# Patient Record
Sex: Male | Born: 1950 | Race: White | Hispanic: No | Marital: Married | Smoking: Former smoker
Health system: Southern US, Community
[De-identification: ages and names within clinical notes are randomized; demographics above are authoritative.]

## PROBLEM LIST (undated history)

## (undated) DIAGNOSIS — G8929 Other chronic pain: Secondary | ICD-10-CM

## (undated) DIAGNOSIS — R519 Headache, unspecified: Secondary | ICD-10-CM

## (undated) DIAGNOSIS — Z973 Presence of spectacles and contact lenses: Secondary | ICD-10-CM

## (undated) DIAGNOSIS — M51379 Other intervertebral disc degeneration, lumbosacral region without mention of lumbar back pain or lower extremity pain: Secondary | ICD-10-CM

## (undated) DIAGNOSIS — E039 Hypothyroidism, unspecified: Secondary | ICD-10-CM

## (undated) DIAGNOSIS — J449 Chronic obstructive pulmonary disease, unspecified: Secondary | ICD-10-CM

## (undated) DIAGNOSIS — N200 Calculus of kidney: Secondary | ICD-10-CM

## (undated) DIAGNOSIS — I7121 Aneurysm of the ascending aorta, without rupture: Secondary | ICD-10-CM

## (undated) DIAGNOSIS — I5189 Other ill-defined heart diseases: Secondary | ICD-10-CM

## (undated) DIAGNOSIS — Z87442 Personal history of urinary calculi: Secondary | ICD-10-CM

## (undated) DIAGNOSIS — I341 Nonrheumatic mitral (valve) prolapse: Secondary | ICD-10-CM

## (undated) DIAGNOSIS — I499 Cardiac arrhythmia, unspecified: Secondary | ICD-10-CM

## (undated) DIAGNOSIS — I77810 Thoracic aortic ectasia: Secondary | ICD-10-CM

## (undated) DIAGNOSIS — I480 Paroxysmal atrial fibrillation: Secondary | ICD-10-CM

## (undated) DIAGNOSIS — R51 Headache: Secondary | ICD-10-CM

## (undated) DIAGNOSIS — R0789 Other chest pain: Secondary | ICD-10-CM

## (undated) DIAGNOSIS — M5137 Other intervertebral disc degeneration, lumbosacral region: Secondary | ICD-10-CM

## (undated) HISTORY — DX: Other chest pain: R07.89

## (undated) HISTORY — DX: Aneurysm of the ascending aorta, without rupture: I71.21

## (undated) HISTORY — DX: Other ill-defined heart diseases: I51.89

## (undated) HISTORY — PX: HEMORROIDECTOMY: SUR656

## (undated) HISTORY — DX: Hypothyroidism, unspecified: E03.9

## (undated) HISTORY — DX: Calculus of kidney: N20.0

## (undated) HISTORY — PX: SPINE SURGERY: SHX786

## (undated) HISTORY — PX: CARDIAC CATHETERIZATION: SHX172

## (undated) HISTORY — PX: OTHER SURGICAL HISTORY: SHX169

## (undated) HISTORY — PX: LUMBAR FUSION: SHX111

## (undated) HISTORY — PX: VASECTOMY: SHX75

## (undated) HISTORY — DX: Thoracic aortic ectasia: I77.810

## (undated) HISTORY — DX: Personal history of urinary calculi: Z87.442

## (undated) HISTORY — DX: Paroxysmal atrial fibrillation: I48.0

## (undated) HISTORY — DX: Nonrheumatic mitral (valve) prolapse: I34.1

## (undated) HISTORY — PX: NASAL SINUS SURGERY: SHX719

## (undated) HISTORY — PX: EYE SURGERY: SHX253

---

## 2005-04-25 ENCOUNTER — Ambulatory Visit: Payer: Self-pay | Admitting: General Surgery

## 2005-04-25 HISTORY — PX: COLONOSCOPY: SHX174

## 2005-10-11 ENCOUNTER — Ambulatory Visit: Payer: Self-pay | Admitting: Family Medicine

## 2005-11-30 ENCOUNTER — Ambulatory Visit: Payer: Self-pay | Admitting: Unknown Physician Specialty

## 2009-09-16 ENCOUNTER — Ambulatory Visit: Payer: Self-pay | Admitting: Internal Medicine

## 2013-04-20 ENCOUNTER — Emergency Department: Payer: Self-pay | Admitting: Emergency Medicine

## 2013-04-20 LAB — CBC WITH DIFFERENTIAL/PLATELET
BASOS ABS: 0 10*3/uL (ref 0.0–0.1)
Basophil %: 0.7 %
EOS PCT: 1.5 %
Eosinophil #: 0.1 10*3/uL (ref 0.0–0.7)
HCT: 43 % (ref 40.0–52.0)
HGB: 14.9 g/dL (ref 13.0–18.0)
LYMPHS ABS: 1.4 10*3/uL (ref 1.0–3.6)
Lymphocyte %: 20.7 %
MCH: 30.7 pg (ref 26.0–34.0)
MCHC: 34.8 g/dL (ref 32.0–36.0)
MCV: 88 fL (ref 80–100)
Monocyte #: 0.5 x10 3/mm (ref 0.2–1.0)
Monocyte %: 7.8 %
Neutrophil #: 4.7 10*3/uL (ref 1.4–6.5)
Neutrophil %: 69.3 %
Platelet: 179 10*3/uL (ref 150–440)
RBC: 4.87 10*6/uL (ref 4.40–5.90)
RDW: 13.8 % (ref 11.5–14.5)
WBC: 6.8 10*3/uL (ref 3.8–10.6)

## 2013-04-20 LAB — URINALYSIS, COMPLETE
Bilirubin,UR: NEGATIVE
GLUCOSE, UR: NEGATIVE mg/dL (ref 0–75)
KETONE: NEGATIVE
Leukocyte Esterase: NEGATIVE
Nitrite: NEGATIVE
PH: 5 (ref 4.5–8.0)
PROTEIN: NEGATIVE
Specific Gravity: 1.019 (ref 1.003–1.030)
Squamous Epithelial: NONE SEEN

## 2013-04-20 LAB — BASIC METABOLIC PANEL
ANION GAP: 2 — AB (ref 7–16)
BUN: 22 mg/dL — AB (ref 7–18)
CO2: 31 mmol/L (ref 21–32)
CREATININE: 1.19 mg/dL (ref 0.60–1.30)
Calcium, Total: 8.4 mg/dL — ABNORMAL LOW (ref 8.5–10.1)
Chloride: 106 mmol/L (ref 98–107)
EGFR (African American): >60
EGFR (Non-African Amer.): >60
GLUCOSE: 114 mg/dL — AB (ref 65–99)
Osmolality: 282 (ref 275–301)
Potassium: 3.9 mmol/L (ref 3.5–5.1)
Sodium: 139 mmol/L (ref 136–145)

## 2013-06-28 ENCOUNTER — Ambulatory Visit (INDEPENDENT_AMBULATORY_CARE_PROVIDER_SITE_OTHER): Payer: BC Managed Care – PPO | Admitting: Cardiovascular Disease

## 2013-06-28 ENCOUNTER — Encounter: Payer: Self-pay | Admitting: Cardiovascular Disease

## 2013-06-28 ENCOUNTER — Encounter (INDEPENDENT_AMBULATORY_CARE_PROVIDER_SITE_OTHER): Payer: Self-pay

## 2013-06-28 VITALS — BP 112/86 | HR 51 | Ht 70.0 in | Wt 166.8 lb

## 2013-06-28 DIAGNOSIS — R0609 Other forms of dyspnea: Secondary | ICD-10-CM

## 2013-06-28 DIAGNOSIS — R0989 Other specified symptoms and signs involving the circulatory and respiratory systems: Secondary | ICD-10-CM

## 2013-06-28 DIAGNOSIS — I4819 Other persistent atrial fibrillation: Secondary | ICD-10-CM

## 2013-06-28 DIAGNOSIS — I4891 Unspecified atrial fibrillation: Secondary | ICD-10-CM

## 2013-06-28 DIAGNOSIS — R0600 Dyspnea, unspecified: Secondary | ICD-10-CM

## 2013-06-28 DIAGNOSIS — I341 Nonrheumatic mitral (valve) prolapse: Secondary | ICD-10-CM

## 2013-06-28 DIAGNOSIS — I059 Rheumatic mitral valve disease, unspecified: Secondary | ICD-10-CM

## 2013-06-28 MED ORDER — ASPIRIN 81 MG PO TABS: 81.0000 mg | ORAL_TABLET | Freq: Every day | ORAL | Status: DC

## 2013-06-28 NOTE — Progress Notes (Signed)
Primary care physician: Dr. Kary Kos  HPI  This is a 63 year old man who is here today to establish cardiovascular care. He is the husband of Elige Shouse whom I treated recently for non-ST elevation myocardial infarction. He is transferring care from [facility]. He reports prolonged history of atrial fibrillation which was diagnosed in the 31s. There was questionable severe mitral valve prolapse on initial echocardiogram that repeat evaluation at [facility] was unremarkable. He was told that the mitral valve prolapse was mild. There was also questionable intercardiac thrombus but that was excluded by a TEE. He was briefly on warfarin but had an allergic reaction with a rash. He has not been on any other blood thinners.   he reports being on atenolol and amiodarone for many years. The dose of atenolol was increased last year due to breakthrough palpitations. He developed hypothyroidism over the course of treatment with amiodarone. He complains of significant exertional dyspnea which has been happening for years with no chest discomfort. He has baseline bradycardia but denies dizziness or syncope unless he stands up suddenly. There is no history of stroke or congestive heart failure. He quit smoking in 2002 and does not consume alcohol. There is no family history of premature coronary artery disease. He had multiple stress test throughout the years but none recently. No previous cardiac catheterization.   Allergies  Allergen Reactions  . Coumadin [Warfarin]     rash     No current outpatient prescriptions on file prior to visit.   No current facility-administered medications on file prior to visit.     Past Medical History  Diagnosis Date  . Mitral valve prolapse   . History of kidney stones   . Hypothyroid   . Persistent atrial fibrillation      Past Surgical History  Procedure Laterality Date  . Nasal sinus surgery    . Hemorroidectomy       Family History  Problem Relation Age of  Onset  . Heart failure Mother      History   Social History  . Marital Status: Married    Spouse Name: N/A    Number of Children: N/A  . Years of Education: N/A   Occupational History  . Not on file.   Social History Main Topics  . Smoking status: Former Smoker -- 1.50 packs/day for 31 years    Types: Cigarettes  . Smokeless tobacco: Not on file  . Alcohol Use: No  . Drug Use: No  . Sexual Activity: Not on file   Other Topics Concern  . Not on file   Social History Narrative  . No narrative on file     ROS A 10 point review of system was performed. It is negative other than that mentioned in the history of present illness.   PHYSICAL EXAM   BP 112/86  Pulse 51  Ht 5\' 10"  (1.778 m)  Wt 166 lb 12 oz (75.637 kg)  BMI 23.93 kg/m2 Constitutional: He is oriented to person, place, and time. He appears well-developed and well-nourished. No distress.  HENT: No nasal discharge.  Head: Normocephalic and atraumatic.  Eyes: Pupils are equal and round.  No discharge. Neck: Normal range of motion. Neck supple. No JVD present. No thyromegaly present.  Cardiovascular: Bradycardic, regular rhythm, normal heart sounds. Exam reveals no gallop and no friction rub. There is a faint 1/6 holosystolic murmur at the apex  Pulmonary/Chest: Effort normal and breath sounds normal. No stridor. No respiratory distress. He has no wheezes. He has  no rales. He exhibits no tenderness.  Abdominal: Soft. Bowel sounds are normal. He exhibits no distension. There is no tenderness. There is no rebound and no guarding.  Musculoskeletal: Normal range of motion. He exhibits no edema and no tenderness.  Neurological: He is alert and oriented to person, place, and time. Coordination normal.  Skin: Skin is warm and dry. No rash noted. He is not diaphoretic. No erythema. No pallor.  Psychiatric: He has a normal mood and affect. His behavior is normal. Judgment and thought content normal.        AJO:INOMV  Bradycardia  -First degree A-V block  PRi = 224 -RSR(V1) -nondiagnostic.   - Extensive anterior-lateral infarct  Old  -Right axis -may be secondary to infarct.   -  Nonspecific T-abnormality.     ASSESSMENT AND PLAN

## 2013-06-28 NOTE — Assessment & Plan Note (Addendum)
He has prolonged history of atrial fibrillation. Currently he is maintained in sinus rhythm on small dose amiodarone. I advised him to start aspirin 81 mg once daily. CHADS2 VASc score is 0. Obviously, with his young age and prolonged exposure to amiodarone, there is increased risk of liver, thyroid and lung toxicity. It might be worth switching him to a different antiarrhythmic medication if he is a candidate or even considering catheter ablation. He is bradycardic with first degree AV block. However, he seems to be asymptomatic. EKG is significantly abnormal and he might require further ischemic workup after reviewing previous records I requested an echocardiogram to evaluate atrial size, LV systolic function and valvular structures. We also requested his previous cardiac records.

## 2013-06-28 NOTE — Assessment & Plan Note (Signed)
This will be evaluated with echocardiogram.

## 2013-06-28 NOTE — Patient Instructions (Signed)
Your physician has recommended you make the following change in your medication:  Start Aspirin 81 mg daily   Your physician has requested that you have an echocardiogram. Echocardiography is a painless test that uses sound waves to create images of your heart. It provides your doctor with information about the size and shape of your heart and how well your heart's chambers and valves are working. This procedure takes approximately one hour. There are no restrictions for this procedure.  Your physician recommends that you schedule a follow-up appointment in:  2 months

## 2013-07-09 ENCOUNTER — Other Ambulatory Visit (INDEPENDENT_AMBULATORY_CARE_PROVIDER_SITE_OTHER): Payer: BC Managed Care – PPO

## 2013-07-09 ENCOUNTER — Other Ambulatory Visit: Payer: Self-pay

## 2013-07-09 DIAGNOSIS — R0602 Shortness of breath: Secondary | ICD-10-CM

## 2013-07-09 DIAGNOSIS — R0600 Dyspnea, unspecified: Secondary | ICD-10-CM

## 2013-07-09 DIAGNOSIS — I059 Rheumatic mitral valve disease, unspecified: Secondary | ICD-10-CM

## 2013-07-09 DIAGNOSIS — I4891 Unspecified atrial fibrillation: Secondary | ICD-10-CM

## 2013-08-29 ENCOUNTER — Encounter: Payer: Self-pay | Admitting: Cardiovascular Disease

## 2013-08-29 ENCOUNTER — Ambulatory Visit (INDEPENDENT_AMBULATORY_CARE_PROVIDER_SITE_OTHER): Payer: BC Managed Care – PPO | Admitting: Cardiovascular Disease

## 2013-08-29 VITALS — BP 110/92 | HR 49 | Ht 70.0 in | Wt 167.2 lb

## 2013-08-29 DIAGNOSIS — I4819 Other persistent atrial fibrillation: Secondary | ICD-10-CM

## 2013-08-29 DIAGNOSIS — I341 Nonrheumatic mitral (valve) prolapse: Secondary | ICD-10-CM

## 2013-08-29 DIAGNOSIS — I059 Rheumatic mitral valve disease, unspecified: Secondary | ICD-10-CM

## 2013-08-29 DIAGNOSIS — I4891 Unspecified atrial fibrillation: Secondary | ICD-10-CM

## 2013-08-29 NOTE — Assessment & Plan Note (Signed)
Overall, the patient is doing reasonably well. He continues to have brief episodes of palpitations likely due to breakthrough atrial fibrillation. He is usually well aware of these episodes. He feels more fatigued and tired since the dose of atenolol was increased to 50 mg once daily last year likely due to bradycardia. It appears that he had more palpitations when he was on a smaller dose. We discussed different management options going forward. I have raised the concern about continuing amiodarone in his age group given the cumulative toxicity associated with this. He already developed hypothyroidism. I think the options going forward include continuing same medications , switching to a different antiarrhythmic medication or considering catheter ablation.  I am referring him to Dr. Rayann Heman to discuss options.

## 2013-08-29 NOTE — Assessment & Plan Note (Signed)
There was only mild mitral regurgitation associated with this.     

## 2013-08-29 NOTE — Progress Notes (Signed)
Primary care physician: Dr. Kary Kos  HPI  This is a 63 year old man who is here today for a followup visit regarding persistent atrial fibrillation and mitral valve prolapse with mild regurgitation. He switched recently from Dr. Josefa Half. He has prolonged history of atrial fibrillation which was diagnosed in the 32s. There was questionable severe mitral valve prolapse on initial echocardiogram but repeat evaluation at [facility] was unremarkable. He was told that the mitral valve prolapse was mild. There was also questionable intercardiac thrombus but that was excluded by a TEE. He was briefly on warfarin but had an allergic reaction with a rash. He has not been on any other blood thinners since then given his low CHADS2 VASc score.   he reports being on atenolol and amiodarone for many years. The dose of atenolol was increased last year due to breakthrough palpitations. He developed hypothyroidism over the course of treatment with amiodarone. He has baseline bradycardia but denies dizziness or syncope unless he stands up suddenly. There is no history of stroke or congestive heart failure. He quit smoking in 2002 and does not consume alcohol. There is no family history of premature coronary artery disease. He had multiple stress test throughout the years but none recently.  Echocardiogram in may of this year showed an ejection fraction of 55-60% with no evidence of left ventricular hypertrophy. The wall is moderate mitral valve prolapse with only mild regurgitation. There was mild biatrial enlargement. He describes brief episodes of palpitations usually for a few minutes.   Allergies  Allergen Reactions  . Coumadin [Warfarin]     rash     Current Outpatient Prescriptions on File Prior to Visit  Medication Sig Dispense Refill  . aspirin 81 MG tablet Take 1 tablet (81 mg total) by mouth daily.  30 tablet    . atenolol (TENORMIN) 50 MG tablet Take 50 mg by mouth daily.       Marland Kitchen levothyroxine (SYNTHROID,  LEVOTHROID) 75 MCG tablet Take 75 mcg by mouth daily before breakfast.       . PACERONE 200 MG tablet Take 100 mg by mouth daily.        No current facility-administered medications on file prior to visit.     Past Medical History  Diagnosis Date  . Mitral valve prolapse   . History of kidney stones   . Hypothyroid   . Persistent atrial fibrillation      Past Surgical History  Procedure Laterality Date  . Nasal sinus surgery    . Hemorroidectomy       Family History  Problem Relation Age of Onset  . Heart failure Mother      History   Social History  . Marital Status: Married    Spouse Name: N/A    Number of Children: N/A  . Years of Education: N/A   Occupational History  . Not on file.   Social History Main Topics  . Smoking status: Former Smoker -- 1.50 packs/day for 31 years    Types: Cigarettes  . Smokeless tobacco: Not on file  . Alcohol Use: No  . Drug Use: No  . Sexual Activity: Not on file   Other Topics Concern  . Not on file   Social History Narrative  . No narrative on file     ROS A 10 point review of system was performed. It is negative other than that mentioned in the history of present illness.   PHYSICAL EXAM   BP 110/92  Pulse 49  Ht  5\' 10"  (1.778 m)  Wt 167 lb 4 oz (75.864 kg)  BMI 24.00 kg/m2 Constitutional: He is oriented to person, place, and time. He appears well-developed and well-nourished. No distress.  HENT: No nasal discharge.  Head: Normocephalic and atraumatic.  Eyes: Pupils are equal and round.  No discharge. Neck: Normal range of motion. Neck supple. No JVD present. No thyromegaly present.  Cardiovascular: Bradycardic, regular rhythm, normal heart sounds. Exam reveals no gallop and no friction rub. There is a faint 1/6 holosystolic murmur at the apex  Pulmonary/Chest: Effort normal and breath sounds normal. No stridor. No respiratory distress. He has no wheezes. He has no rales. He exhibits no tenderness.    Abdominal: Soft. Bowel sounds are normal. He exhibits no distension. There is no tenderness. There is no rebound and no guarding.  Musculoskeletal: Normal range of motion. He exhibits no edema and no tenderness.  Neurological: He is alert and oriented to person, place, and time. Coordination normal.  Skin: Skin is warm and dry. No rash noted. He is not diaphoretic. No erythema. No pallor.  Psychiatric: He has a normal mood and affect. His behavior is normal. Judgment and thought content normal.       EKG: Marked sinus  Bradycardia  -Left atrial enlargement.   -Right sided conduction defect and right axis -possible right ventricular hypertrophy or posterior fascicular block.   -Anteroseptal infarct -age undetermined.   ABNORMAL     ASSESSMENT AND PLAN

## 2013-08-29 NOTE — Patient Instructions (Addendum)
Continue same medications.   Refer to Dr. Rayann Heman for A-fib:  July 24 @ 3:15. Please call [phone] if you cannot keep this appointment.  Your physician wants you to follow-up in: 6 months.  You will receive a reminder letter in the mail two months in advance. If you don't receive a letter, please call our office to schedule the follow-up appointment.

## 2013-09-13 ENCOUNTER — Institutional Professional Consult (permissible substitution): Payer: BC Managed Care – PPO | Admitting: Internal Medicine

## 2013-09-20 ENCOUNTER — Telehealth: Payer: Self-pay

## 2013-09-20 NOTE — Telephone Encounter (Signed)
Pt states he has been in afib all week, states he has to r/s his appt with Dr. Rayann Heman, and hasnt seen him yet. He thinks it may be his medication. Please call.

## 2013-09-20 NOTE — Telephone Encounter (Signed)
Patient states he is feeling better and is back in normal rhythm  I advised him to call if he has sustaining afib and is feeling symptomatic   Instructed him to go to the ED if he feels his situation is emergent

## 2013-10-21 ENCOUNTER — Encounter: Payer: Self-pay | Admitting: Internal Medicine

## 2013-10-21 ENCOUNTER — Ambulatory Visit (INDEPENDENT_AMBULATORY_CARE_PROVIDER_SITE_OTHER): Payer: BC Managed Care – PPO | Admitting: Internal Medicine

## 2013-10-21 VITALS — BP 118/76 | HR 52 | Ht 70.0 in | Wt 165.8 lb

## 2013-10-21 DIAGNOSIS — I4891 Unspecified atrial fibrillation: Secondary | ICD-10-CM

## 2013-10-21 DIAGNOSIS — I4819 Other persistent atrial fibrillation: Secondary | ICD-10-CM

## 2013-10-21 NOTE — Progress Notes (Signed)
Primary Care Physician: [name], MD Referring Physician: Dr. Veatrice Bourbon Bobby Flowers is a 63 y.o. male with a h/o atrial fibrillation on amiodarone therapy for 20 plus years, mitral valve prolapse, hypothyroidism, that is here today  for electrophysiology evaluation to discuss options for treatment of atrial fibrillation.  Patient currently has very little  atrial fibrillation burden on low dose of amiodarone 100 mg a day.   He uses atenolol as well to help maintain sinus rhythm.  He states for the most part he is in sinus rhythm. He has a chads vas score of 0 and is on a baby aspirin a day. He was worked up in [place] area 20 years ago for his atrial fibrillation currently followed by Dr. Fletcher Anon in [place].  Today, he denies symptoms of palpitations, chest pain, shortness of breath, orthopnea, PND, lower extremity edema, dizziness, presyncope, syncope, or neurologic sequela. The patient is tolerating medications without difficulties and is otherwise without complaint today.   Past Medical History  Diagnosis Date  . Mitral valve prolapse     With mild regurgitation  . History of kidney stones   . Hypothyroid   . Persistent atrial fibrillation   . Thyroid disease    Past Surgical History  Procedure Laterality Date  . Nasal sinus surgery    . Hemorroidectomy      Current Outpatient Prescriptions  Medication Sig Dispense Refill  . aspirin 81 MG tablet Take 1 tablet (81 mg total) by mouth daily.  30 tablet    . atenolol (TENORMIN) 50 MG tablet Take 75 mg by mouth daily.       Marland Kitchen levothyroxine (SYNTHROID, LEVOTHROID) 75 MCG tablet Take 75 mcg by mouth daily before breakfast.       . PACERONE 200 MG tablet Take 100 mg by mouth daily.       . SUMAtriptan (IMITREX) 100 MG tablet Take 1 tablet by mouth daily as needed. Migraines       No current facility-administered medications for this visit.    Allergies  Allergen Reactions  . Coumadin [Warfarin] Rash    History   Social  History  . Marital Status: Married    Spouse Name: N/A    Number of Children: N/A  . Years of Education: N/A   Occupational History  . Not on file.   Social History Main Topics  . Smoking status: Former Smoker -- 1.50 packs/day for 31 years    Types: Cigarettes  . Smokeless tobacco: Not on file  . Alcohol Use: No  . Drug Use: No  . Sexual Activity: Not on file   Other Topics Concern  . Not on file   Social History Narrative  . No narrative on file    Family History  Problem Relation Age of Onset  . Heart failure Mother     ROS- All systems are reviewed and negative except as per the HPI above  Physical Exam: Filed Vitals:   10/21/13 0852  BP: 118/76  Pulse: 52  Height: 5\' 10"  (1.778 m)  Weight: 75.206 kg (165 lb 12.8 oz)    GEN- The patient is well appearing, alert and oriented x 3 today.   Head- normocephalic, atraumatic Eyes-  Sclera clear, conjunctiva pink Ears- hearing intact Oropharynx- clear Neck- supple, no JVP Lymph- no cervical lymphadenopathy Lungs- Clear to ausculation bilaterally, normal work of breathing Heart- Regular rate and rhythm, no murmurs, rubs or gallops, PMI not laterally displaced GI- soft, NT, ND, + BS Extremities-  no clubbing, cyanosis, or edema MS- no significant deformity or atrophy Skin- no rash or lesion Psych- euthymic mood, full affect Neuro- strength and sensation are intact  EKG- Sinus rhythm at 52 bpm.  Echo LA size 68mm with mod MVP. Ef normal at 55-60%  Assessment and Plan: 1. Atrial fibrillation- Therapeutic strategies for afib including medicine and ablation were discussed in detail with the patient today. Risk, benefits, and alternatives to EP study and radiofrequency ablation for afib were also discussed in detail today. These risks include but are not limited to stroke, bleeding, vascular damage, tamponade, perforation, damage to the esophagus, lungs, and other structures, pulmonary vein stenosis, worsening renal  function, and death.  With low dose amiodarone/low risk for complication of drug and very little afib burden, patient wishes to continue with his current therapy did not make any changes at this time.   2. Chadsvasc of 0- continue asa.  3. F/u- as needed.  I have seen, examined the patient, and reviewed the above assessment and plan.  Changes to above are made where necessary.  The patient has persistent afib.  He has been treated with amiodarone for quite some time.  Risks, benefits, and alternatives to amiodarone were discussed.  We also discussed ablation today.  He is very clear at this time that he wishes to continue amiodarone and avoid change. I will therefore return his care to Dr Fletcher Anon.  He will return as needed.  Co Sign: Thompson Grayer, MD 10/21/2013 8:35 PM

## 2013-10-21 NOTE — Patient Instructions (Signed)
Your physician recommends that you schedule a follow-up appointment with Dr Rayann Heman as needed and Dr Fletcher Anon as scheduled

## 2014-03-18 ENCOUNTER — Encounter: Payer: Self-pay | Admitting: Cardiovascular Disease

## 2014-03-18 ENCOUNTER — Ambulatory Visit (INDEPENDENT_AMBULATORY_CARE_PROVIDER_SITE_OTHER): Payer: BLUE CROSS/BLUE SHIELD | Admitting: Cardiovascular Disease

## 2014-03-18 VITALS — BP 110/78 | HR 54 | Ht 70.0 in | Wt 169.0 lb

## 2014-03-18 DIAGNOSIS — I4819 Other persistent atrial fibrillation: Secondary | ICD-10-CM

## 2014-03-18 DIAGNOSIS — I341 Nonrheumatic mitral (valve) prolapse: Secondary | ICD-10-CM

## 2014-03-18 DIAGNOSIS — I481 Persistent atrial fibrillation: Secondary | ICD-10-CM

## 2014-03-18 MED ORDER — ATENOLOL 50 MG PO TABS: 75.0000 mg | ORAL_TABLET | Freq: Every day | ORAL | Status: DC

## 2014-03-18 NOTE — Assessment & Plan Note (Signed)
There was only mild mitral regurgitation associated with this.

## 2014-03-18 NOTE — Assessment & Plan Note (Signed)
He is maintaining in sinus rhythm with low dose amiodarone. He gets his labs checked with Dr. Kary Kos. I recommend checking TSH and liver profile every 6-12 months given that he is on amiodarone.

## 2014-03-18 NOTE — Progress Notes (Signed)
Primary care physician: Dr. Kary Kos  HPI  This is a 64 year old man who is here today for a followup visit regarding persistent atrial fibrillation and mitral valve prolapse with mild regurgitation. He has prolonged history of atrial fibrillation which was diagnosed in the 55s. There was questionable severe mitral valve prolapse on initial echocardiogram but repeat evaluation at [facility] was unremarkable. He was told that the mitral valve prolapse was mild. There was also questionable intercardiac thrombus but that was excluded by a TEE. He was briefly on warfarin but had an allergic reaction with a rash. He has not been on any other blood thinners since then given his low CHADS2 VASc score.  He quit smoking in 2002 and does not consume alcohol. There is no family history of premature coronary artery disease. He had multiple stress test throughout the years but none recently.  Echocardiogram in may of 2015 showed an ejection fraction of 55-60% with no evidence of left ventricular hypertrophy. There was moderate mitral valve prolapse with only mild regurgitation. There was mild biatrial enlargement. He was seen by Dr. Rayann Heman for consultation regarding possible catheter ablation for atrial fibrillation. Given stability on low-dose amiodarone, which was best decided to continue with medical therapy.   Allergies  Allergen Reactions  . Coumadin [Warfarin] Rash     Current Outpatient Prescriptions on File Prior to Visit  Medication Sig Dispense Refill  . aspirin 81 MG tablet Take 1 tablet (81 mg total) by mouth daily. 30 tablet   . levothyroxine (SYNTHROID, LEVOTHROID) 75 MCG tablet Take 75 mcg by mouth daily before breakfast.     . PACERONE 200 MG tablet Take 100 mg by mouth daily.     . SUMAtriptan (IMITREX) 100 MG tablet Take 1 tablet by mouth daily as needed. Migraines     No current facility-administered medications on file prior to visit.     Past Medical History  Diagnosis Date  . Mitral  valve prolapse     With mild regurgitation  . History of kidney stones   . Hypothyroid   . Persistent atrial fibrillation   . Thyroid disease      Past Surgical History  Procedure Laterality Date  . Nasal sinus surgery    . Hemorroidectomy       Family History  Problem Relation Age of Onset  . Heart failure Mother      History   Social History  . Marital Status: Married    Spouse Name: N/A    Number of Children: N/A  . Years of Education: N/A   Occupational History  . Not on file.   Social History Main Topics  . Smoking status: Former Smoker -- 1.50 packs/day for 31 years    Types: Cigarettes  . Smokeless tobacco: Not on file  . Alcohol Use: No  . Drug Use: No  . Sexual Activity: Not on file   Other Topics Concern  . Not on file   Social History Narrative     ROS A 10 point review of system was performed. It is negative other than that mentioned in the history of present illness.   PHYSICAL EXAM   BP 110/78 mmHg  Pulse 54  Ht 5\' 10"  (1.778 m)  Wt 169 lb (76.658 kg)  BMI 24.25 kg/m2 Constitutional: He is oriented to person, place, and time. He appears well-developed and well-nourished. No distress.  HENT: No nasal discharge.  Head: Normocephalic and atraumatic.  Eyes: Pupils are equal and round.  No discharge.  Neck: Normal range of motion. Neck supple. No JVD present. No thyromegaly present.  Cardiovascular: Bradycardic, regular rhythm, normal heart sounds. Exam reveals no gallop and no friction rub. There is a faint 1/6 holosystolic murmur at the apex  Pulmonary/Chest: Effort normal and breath sounds normal. No stridor. No respiratory distress. He has no wheezes. He has no rales. He exhibits no tenderness.  Abdominal: Soft. Bowel sounds are normal. He exhibits no distension. There is no tenderness. There is no rebound and no guarding.  Musculoskeletal: Normal range of motion. He exhibits no edema and no tenderness.  Neurological: He is alert and  oriented to person, place, and time. Coordination normal.  Skin: Skin is warm and dry. No rash noted. He is not diaphoretic. No erythema. No pallor.  Psychiatric: He has a normal mood and affect. His behavior is normal. Judgment and thought content normal.       EKG: Marked sinus  Bradycardia with first-degree AV block -Left atrial enlargement.   -Right sided conduction defect and right axis -possible right ventricular hypertrophy or posterior fascicular block.   -Anteroseptal infarct -age undetermined.   ABNORMAL     ASSESSMENT AND PLAN

## 2014-03-18 NOTE — Patient Instructions (Signed)
Continue same medications.   Your physician wants you to follow-up in: 6 months.  You will receive a reminder letter in the mail two months in advance. If you don't receive a letter, please call our office to schedule the follow-up appointment.  

## 2014-09-02 ENCOUNTER — Other Ambulatory Visit: Payer: Self-pay | Admitting: *Deleted

## 2014-09-02 ENCOUNTER — Telehealth: Payer: Self-pay | Admitting: *Deleted

## 2014-09-02 MED ORDER — AMIODARONE HCL 200 MG PO TABS: 100.0000 mg | ORAL_TABLET | Freq: Every day | ORAL | Status: DC

## 2014-09-02 NOTE — Telephone Encounter (Signed)
90 day supply sent to local pharmacy for Pacerone to [facility].

## 2014-09-02 NOTE — Telephone Encounter (Signed)
°  1. Which medications need to be refilled? Pacerone   2. Which pharmacy is medication to be sent to? [facility] on [address]  3. Do they need a 30 day or 90 day supply? 90   4. Would they like a call back once the medication has been sent to the pharmacy? No

## 2014-09-29 ENCOUNTER — Ambulatory Visit (INDEPENDENT_AMBULATORY_CARE_PROVIDER_SITE_OTHER): Payer: BLUE CROSS/BLUE SHIELD | Admitting: Cardiovascular Disease

## 2014-09-29 ENCOUNTER — Encounter: Payer: Self-pay | Admitting: Cardiovascular Disease

## 2014-09-29 VITALS — BP 106/74 | HR 86 | Ht 70.0 in | Wt 170.8 lb

## 2014-09-29 DIAGNOSIS — I4819 Other persistent atrial fibrillation: Secondary | ICD-10-CM

## 2014-09-29 DIAGNOSIS — I481 Persistent atrial fibrillation: Secondary | ICD-10-CM | POA: Diagnosis not present

## 2014-09-29 DIAGNOSIS — I4891 Unspecified atrial fibrillation: Secondary | ICD-10-CM | POA: Diagnosis not present

## 2014-09-29 DIAGNOSIS — I341 Nonrheumatic mitral (valve) prolapse: Secondary | ICD-10-CM

## 2014-09-29 MED ORDER — AMIODARONE HCL 200 MG PO TABS: 200.0000 mg | ORAL_TABLET | Freq: Every day | ORAL | Status: DC

## 2014-09-29 NOTE — Assessment & Plan Note (Signed)
There was only mild mitral regurgitation associated with this.

## 2014-09-29 NOTE — Progress Notes (Signed)
Primary care physician: Dr. Kary Kos  HPI  This is a 64 year old man who is here today for a followup visit regarding persistent atrial fibrillation and mitral valve prolapse with mild regurgitation. He has prolonged history of atrial fibrillation which was diagnosed in the 29s. There was questionable severe mitral valve prolapse on initial echocardiogram but repeat evaluation at [facility] was unremarkable. He was told that the mitral valve prolapse was mild. There was also questionable intercardiac thrombus but that was excluded by a TEE. He was briefly on warfarin but had an allergic reaction with a rash. He has not been on any other blood thinners since then given his low CHADS2 VASc score.  He quit smoking in 2002 and does not consume alcohol. There is no family history of premature coronary artery disease. He had multiple stress test throughout the years but none recently.  Echocardiogram in may of 2015 showed an ejection fraction of 55-60% with no evidence of left ventricular hypertrophy. There was moderate mitral valve prolapse with only mild regurgitation. There was mild biatrial enlargement. He was seen by Dr. Rayann Heman in 2015 for consultation regarding possible catheter ablation for atrial fibrillation. Given stability on low-dose amiodarone, which was best decided to continue with medical therapy. The patient noted recurrent episodes of atrial fibrillation over the last few weeks. He is usually aware of the episodes as he developed exertional palpitations and shortness of breath. No chest pain. There has been no significant change in his medications.   Allergies  Allergen Reactions  . Coumadin [Warfarin] Rash     Current Outpatient Prescriptions on File Prior to Visit  Medication Sig Dispense Refill  . aspirin 81 MG tablet Take 1 tablet (81 mg total) by mouth daily. 30 tablet   . atenolol (TENORMIN) 50 MG tablet Take 1.5 tablets (75 mg total) by mouth daily. 135 tablet 3  . levothyroxine  (SYNTHROID, LEVOTHROID) 75 MCG tablet Take 75 mcg by mouth daily before breakfast.     . SUMAtriptan (IMITREX) 100 MG tablet Take 1 tablet by mouth daily as needed. Migraines     No current facility-administered medications on file prior to visit.     Past Medical History  Diagnosis Date  . Mitral valve prolapse     With mild regurgitation  . History of kidney stones   . Hypothyroid   . Persistent atrial fibrillation   . Thyroid disease      Past Surgical History  Procedure Laterality Date  . Nasal sinus surgery    . Hemorroidectomy       Family History  Problem Relation Age of Onset  . Heart failure Mother      History   Social History  . Marital Status: Married    Spouse Name: N/A  . Number of Children: N/A  . Years of Education: N/A   Occupational History  . Not on file.   Social History Main Topics  . Smoking status: Former Smoker -- 1.50 packs/day for 31 years    Types: Cigarettes  . Smokeless tobacco: Not on file  . Alcohol Use: No  . Drug Use: No  . Sexual Activity: Not on file   Other Topics Concern  . Not on file   Social History Narrative     ROS A 10 point review of system was performed. It is negative other than that mentioned in the history of present illness.   PHYSICAL EXAM   BP 106/74 mmHg  Pulse 86  Ht 5\' 10"  (1.778 m)  Wt 170 lb 12 oz (77.452 kg)  BMI 24.50 kg/m2 Constitutional: He is oriented to person, place, and time. He appears well-developed and well-nourished. No distress.  HENT: No nasal discharge.  Head: Normocephalic and atraumatic.  Eyes: Pupils are equal and round.  No discharge. Neck: Normal range of motion. Neck supple. No JVD present. No thyromegaly present.  Cardiovascular: Normal rate, irregular rhythm, normal heart sounds. Exam reveals no gallop and no friction rub. There is a faint 1/6 holosystolic murmur at the apex  Pulmonary/Chest: Effort normal and breath sounds normal. No stridor. No respiratory  distress. He has no wheezes. He has no rales. He exhibits no tenderness.  Abdominal: Soft. Bowel sounds are normal. He exhibits no distension. There is no tenderness. There is no rebound and no guarding.  Musculoskeletal: Normal range of motion. He exhibits no edema and no tenderness.  Neurological: He is alert and oriented to person, place, and time. Coordination normal.  Skin: Skin is warm and dry. No rash noted. He is not diaphoretic. No erythema. No pallor.  Psychiatric: He has a normal mood and affect. His behavior is normal. Judgment and thought content normal.       EKG: Atrial fibrillation  - occasional ectopic ventricular beat    -Prominent R(V1) and right axis -consider right ventricular hypertrophy  -consider pulmonary disease.   -Anteroseptal infarct -age undetermined.   ABNORMAL    ASSESSMENT AND PLAN

## 2014-09-29 NOTE — Assessment & Plan Note (Addendum)
The patient is now in atrial fibrillation. Ventricular rate is reasonably controlled at rest but does not seem to be well controlled with activities. I discussed different management options and we decided to increase the dose of amiodarone back to 200 mg once daily. Continue atenolol at 75 mg once daily. If he continues to have symptomatic atrial fibrillation, then the next step would be to reconsider catheter ablation or a different anti-arrhythmia medication. I ordered routine labs today to evaluate for any reversible causes for A. fib.

## 2014-09-29 NOTE — Patient Instructions (Signed)
Medication Instructions:  Your physician has recommended you make the following change in your medication:  INCREASE amiodarone to 200mg  once per day   Labwork: Your physician recommends that you have labs today: TSH, CBC, BMET   Testing/Procedures: none  Follow-Up: Your physician recommends that you schedule a follow-up appointment in: three months with Dr. Fletcher Anon.    Any Other Special Instructions Will Be Listed Below (If Applicable).

## 2014-09-30 LAB — BASIC METABOLIC PANEL
BUN / CREAT RATIO: 11 (ref 10–22)
BUN: 11 mg/dL (ref 8–27)
CHLORIDE: 103 mmol/L (ref 97–108)
CO2: 21 mmol/L (ref 18–29)
Calcium: 8.8 mg/dL (ref 8.6–10.2)
Creatinine, Ser: 0.97 mg/dL (ref 0.76–1.27)
GFR calc non Af Amer: 83 mL/min/{1.73_m2} (ref >59)
GFR, EST AFRICAN AMERICAN: 96 mL/min/{1.73_m2} (ref >59)
Glucose: 90 mg/dL (ref 65–99)
POTASSIUM: 4.5 mmol/L (ref 3.5–5.2)
SODIUM: 140 mmol/L (ref 134–144)

## 2014-09-30 LAB — CBC
HEMATOCRIT: 45.3 % (ref 37.5–51.0)
Hemoglobin: 15.6 g/dL (ref 12.6–17.7)
MCH: 29.8 pg (ref 26.6–33.0)
MCHC: 34.4 g/dL (ref 31.5–35.7)
MCV: 87 fL (ref 79–97)
PLATELETS: 191 10*3/uL (ref 150–379)
RBC: 5.24 x10E6/uL (ref 4.14–5.80)
RDW: 13.8 % (ref 12.3–15.4)
WBC: 7.1 10*3/uL (ref 3.4–10.8)

## 2014-09-30 LAB — TSH: TSH: 2.75 u[IU]/mL (ref 0.450–4.500)

## 2014-10-15 ENCOUNTER — Telehealth: Payer: Self-pay | Admitting: *Deleted

## 2014-10-15 ENCOUNTER — Other Ambulatory Visit: Payer: Self-pay

## 2014-10-15 NOTE — Telephone Encounter (Signed)
Pt calling stating he saw [place] not to long ago and we changed his meds for he was in afib  Now is calling stating he feels like he is still there Please advise.

## 2014-10-15 NOTE — Telephone Encounter (Signed)
Left message on machine for patient to contact the office.   

## 2014-10-15 NOTE — Telephone Encounter (Signed)
S/w pt who states he still feels like he is in afib. Confirmed that he has been taking amiodarone 200mg  once per day since 8/8 OV States he can come in for EKG tomorrow. Nurse visit set up for 8/25, 10:30am Pt verbalized understanding with no further questions.

## 2014-10-16 ENCOUNTER — Ambulatory Visit (INDEPENDENT_AMBULATORY_CARE_PROVIDER_SITE_OTHER): Payer: BLUE CROSS/BLUE SHIELD

## 2014-10-16 ENCOUNTER — Telehealth: Payer: Self-pay

## 2014-10-16 VITALS — BP 118/78 | HR 107 | Ht 70.0 in | Wt 174.0 lb

## 2014-10-16 DIAGNOSIS — R0602 Shortness of breath: Secondary | ICD-10-CM | POA: Diagnosis not present

## 2014-10-16 DIAGNOSIS — I4891 Unspecified atrial fibrillation: Secondary | ICD-10-CM

## 2014-10-16 MED ORDER — RIVAROXABAN 20 MG PO TABS: 20.0000 mg | ORAL_TABLET | Freq: Every day | ORAL | Status: DC

## 2014-10-16 NOTE — Telephone Encounter (Signed)
S/w pt to reschedule labs for 2 weeks prior to cardioversion. Pt agreeable to Sept 7, 11am Pt had no further questions.

## 2014-10-16 NOTE — Patient Instructions (Addendum)
Medication Instructions:  Your physician has recommended you make the following change in your medication:  STOP taking aspirin START taking Xarelto 20mg  once per day  Labwork: Your physician recommends that you return for lab work in: two weeks. BMET, CBC, PT/INR   Testing/Procedures: You will need an EKG one day before your cardioversion: Sept  20  Follow-Up: Your physician recommends that you schedule a follow-up appointment after cardioversion   Any Other Special Instructions Will Be Listed Below (If Applicable).   You are scheduled for a Cardioversion on  Sept 21, 7:30am with Dr. Fletcher Anon Please arrive at the [place] [facility] at  6:30 a.m. on the day of your procedure.  DIET INSTRUCTIONS:  Nothing to eat or drink after midnight except your medications with a sip of water.         1) Medications:  YOU MAY TAKE ALL of your remaining medications with a small amount of water.  2) Must have a responsible person to drive you home.  3) Bring a current list of your medications and current insurance cards.    If you have any questions after you get home, please call the office at [phone]

## 2014-10-16 NOTE — Patient Instructions (Signed)
1.) Reason for visit: EKG/afib  2.) Name of MD requesting visit: Arida  3.) H&P: History of afib  4.)  ROS related to problem: Pt taking amiodarone since 8/8, atenolol for one week. States he feels like he is still in afib  5.) Assessment and plan per MD: EKG give to Dr. Fletcher Anon who reviewed. Confirmed afib.   Verbal from Arida: Stop aspirin Start xarelto 20mg  once per day Cardioversion in 3 weeks

## 2014-10-17 ENCOUNTER — Telehealth: Payer: Self-pay

## 2014-10-17 NOTE — Telephone Encounter (Signed)
S/w pt regarding changing cardioversion date. Pt agreeable to the following: Labs, Sept 9, 11am EKG, Sept 22, 11am Cardioversion Sept 23, 7:30am Pt verbalized understanding with no further questions.  Dates moved on calendar

## 2014-10-28 ENCOUNTER — Other Ambulatory Visit: Payer: BLUE CROSS/BLUE SHIELD

## 2014-10-29 ENCOUNTER — Other Ambulatory Visit: Payer: BLUE CROSS/BLUE SHIELD

## 2014-10-31 ENCOUNTER — Other Ambulatory Visit (INDEPENDENT_AMBULATORY_CARE_PROVIDER_SITE_OTHER): Payer: BLUE CROSS/BLUE SHIELD

## 2014-10-31 ENCOUNTER — Other Ambulatory Visit: Payer: BLUE CROSS/BLUE SHIELD

## 2014-10-31 DIAGNOSIS — I4891 Unspecified atrial fibrillation: Secondary | ICD-10-CM

## 2014-11-01 LAB — CBC
HEMATOCRIT: 43.8 % (ref 37.5–51.0)
HEMOGLOBIN: 14.6 g/dL (ref 12.6–17.7)
MCH: 29.1 pg (ref 26.6–33.0)
MCHC: 33.3 g/dL (ref 31.5–35.7)
MCV: 87 fL (ref 79–97)
Platelets: 183 10*3/uL (ref 150–379)
RBC: 5.01 x10E6/uL (ref 4.14–5.80)
RDW: 14 % (ref 12.3–15.4)
WBC: 8.2 10*3/uL (ref 3.4–10.8)

## 2014-11-12 SURGERY — Surgical Case
Anesthesia: *Unknown

## 2014-11-13 ENCOUNTER — Other Ambulatory Visit: Payer: Self-pay | Admitting: *Deleted

## 2014-11-13 ENCOUNTER — Ambulatory Visit: Payer: BLUE CROSS/BLUE SHIELD

## 2014-11-13 VITALS — BP 136/82 | HR 52 | Resp 18 | Ht 69.0 in | Wt 170.0 lb

## 2014-11-13 DIAGNOSIS — I4891 Unspecified atrial fibrillation: Secondary | ICD-10-CM

## 2014-11-13 MED ORDER — AMIODARONE HCL 200 MG PO TABS: 200.0000 mg | ORAL_TABLET | Freq: Every day | ORAL | Status: DC

## 2014-11-13 NOTE — Patient Instructions (Signed)
1.) Reason for visit: EKG prior to 9/23 DCCV  2.) Name of MD requesting visit: Arida  3.) H&P: Afib, take amiodarone  4.) ROS related to problem: EKG as pt scheduled for DCCV 9/23 at [facility]  5.) Assessment and plan per MD: EKG given to [facility] for review  Per Arida, pt not in afib at this time; cancel DCCV.  Dr. Fletcher Anon spoke with pt and orders given for  1 month f/u.  S/w Barbara in scheduling at 11:37am  to cancel procedure

## 2014-11-14 ENCOUNTER — Encounter: Payer: Self-pay | Admitting: Cardiovascular Disease

## 2014-11-14 ENCOUNTER — Encounter: Admission: RE | Payer: Self-pay | Source: Ambulatory Visit

## 2014-11-14 ENCOUNTER — Ambulatory Visit
Admission: RE | Admit: 2014-11-14 | Payer: BLUE CROSS/BLUE SHIELD | Source: Ambulatory Visit | Admitting: Cardiovascular Disease

## 2014-11-14 SURGERY — CARDIOVERSION (CATH LAB)
Anesthesia: General

## 2014-11-20 ENCOUNTER — Telehealth: Payer: Self-pay | Admitting: *Deleted

## 2014-11-20 ENCOUNTER — Other Ambulatory Visit: Payer: Self-pay | Admitting: *Deleted

## 2014-11-20 DIAGNOSIS — I4891 Unspecified atrial fibrillation: Secondary | ICD-10-CM

## 2014-11-20 MED ORDER — AMIODARONE HCL 200 MG PO TABS: 200.0000 mg | ORAL_TABLET | Freq: Every day | ORAL | Status: DC

## 2014-11-20 NOTE — Telephone Encounter (Signed)
Amiodarone #90 R#3 [facility] [address]

## 2014-11-20 NOTE — Telephone Encounter (Signed)
°  1. Which medications need to be refilled? Amioderine  2. Which pharmacy is medication to be sent to? [facility] on [address]  3. Do they need a 30 day or 90 day supply? 90 day insurance requires it   4. Would they like a call back once the medication has been sent to the pharmacy? No

## 2014-12-19 ENCOUNTER — Encounter: Payer: Self-pay | Admitting: Cardiovascular Disease

## 2014-12-19 ENCOUNTER — Ambulatory Visit (INDEPENDENT_AMBULATORY_CARE_PROVIDER_SITE_OTHER): Payer: BLUE CROSS/BLUE SHIELD | Admitting: Cardiovascular Disease

## 2014-12-19 VITALS — BP 108/70 | HR 49 | Ht 69.0 in | Wt 174.5 lb

## 2014-12-19 DIAGNOSIS — I481 Persistent atrial fibrillation: Secondary | ICD-10-CM

## 2014-12-19 DIAGNOSIS — I341 Nonrheumatic mitral (valve) prolapse: Secondary | ICD-10-CM

## 2014-12-19 DIAGNOSIS — I4819 Other persistent atrial fibrillation: Secondary | ICD-10-CM

## 2014-12-19 MED ORDER — ATENOLOL 100 MG PO TABS: 100.0000 mg | ORAL_TABLET | Freq: Every day | ORAL | Status: DC

## 2014-12-19 NOTE — Progress Notes (Signed)
Primary care physician: Dr. Kary Kos  HPI  This is a 64 year old man who is here today for a followup visit regarding persistent atrial fibrillation and mitral valve prolapse with mild regurgitation. He has prolonged history of atrial fibrillation which was diagnosed in the 93s. There was questionable severe mitral valve prolapse on initial echocardiogram but repeat evaluation at [facility] was unremarkable. He was told that the mitral valve prolapse was mild. There was also questionable intercardiac thrombus but that was excluded by a TEE. He was briefly on warfarin but had an allergic reaction with a rash. He has not been on any other blood thinners since then given his low CHADS2 VASc score.  He quit smoking in 2002 and does not consume alcohol. There is no family history of premature coronary artery disease. He had multiple stress test throughout the years but none recently.  Echocardiogram in may of 2015 showed an ejection fraction of 55-60% with no evidence of left ventricular hypertrophy. There was moderate mitral valve prolapse with only mild regurgitation. There was mild biatrial enlargement. He was seen by Dr. Rayann Heman in 2015 for consultation regarding possible catheter ablation for atrial fibrillation. Given stability on low-dose amiodarone, which was best decided to continue with medical therapy. He had recurrent atrial fibrillation recently. The dose of amiodarone was increased to 200 mg once daily. The dose of atenolol was also increased and currently he is taking 100 mg once daily. He converted to sinus rhythm chemically and feels back to baseline.   Allergies  Allergen Reactions  . Coumadin [Warfarin] Rash     Current Outpatient Prescriptions on File Prior to Visit  Medication Sig Dispense Refill  . amiodarone (PACERONE) 200 MG tablet Take 1 tablet (200 mg total) by mouth daily. 90 tablet 3  . atenolol (TENORMIN) 50 MG tablet Take 1.5 tablets (75 mg total) by mouth daily. (Patient  taking differently: Take 100 mg by mouth daily. ) 135 tablet 3  . levothyroxine (SYNTHROID, LEVOTHROID) 75 MCG tablet Take 75 mcg by mouth daily before breakfast.     . SUMAtriptan (IMITREX) 100 MG tablet Take 1 tablet by mouth daily as needed. Migraines    . rivaroxaban (XARELTO) 20 MG TABS tablet Take 1 tablet (20 mg total) by mouth daily with supper. (Patient not taking: Reported on 12/19/2014) 30 tablet 1   No current facility-administered medications on file prior to visit.     Past Medical History  Diagnosis Date  . Mitral valve prolapse     With mild regurgitation  . History of kidney stones   . Hypothyroid   . Persistent atrial fibrillation ([place])   . Thyroid disease      Past Surgical History  Procedure Laterality Date  . Nasal sinus surgery    . Hemorroidectomy       Family History  Problem Relation Age of Onset  . Heart failure Mother      Social History   Social History  . Marital Status: Married    Spouse Name: N/A  . Number of Children: N/A  . Years of Education: N/A   Occupational History  . Not on file.   Social History Main Topics  . Smoking status: Former Smoker -- 1.50 packs/day for 31 years    Types: Cigarettes  . Smokeless tobacco: Not on file  . Alcohol Use: No  . Drug Use: No  . Sexual Activity: Not on file   Other Topics Concern  . Not on file   Social History Narrative  ROS A 10 point review of system was performed. It is negative other than that mentioned in the history of present illness.   PHYSICAL EXAM   BP 108/70 mmHg  Pulse 49  Ht 5\' 9"  (1.753 m)  Wt 174 lb 8 oz (79.153 kg)  BMI 25.76 kg/m2 Constitutional: He is oriented to person, place, and time. He appears well-developed and well-nourished. No distress.  HENT: No nasal discharge.  Head: Normocephalic and atraumatic.  Eyes: Pupils are equal and round.  No discharge. Neck: Normal range of motion. Neck supple. No JVD present. No thyromegaly present.    Cardiovascular: Bradycardic, regular rhythm, normal heart sounds. Exam reveals no gallop and no friction rub. There is a faint 1/6 holosystolic murmur at the apex  Pulmonary/Chest: Effort normal and breath sounds normal. No stridor. No respiratory distress. He has no wheezes. He has no rales. He exhibits no tenderness.  Abdominal: Soft. Bowel sounds are normal. He exhibits no distension. There is no tenderness. There is no rebound and no guarding.  Musculoskeletal: Normal range of motion. He exhibits no edema and no tenderness.  Neurological: He is alert and oriented to person, place, and time. Coordination normal.  Skin: Skin is warm and dry. No rash noted. He is not diaphoretic. No erythema. No pallor.  Psychiatric: He has a normal mood and affect. His behavior is normal. Judgment and thought content normal.       EKG: Sinus bradycardia with first-degree AV block. Old anteroseptal infarct.  ASSESSMENT AND PLAN

## 2014-12-19 NOTE — Patient Instructions (Signed)
Medication Instructions: Continue with Atenolol 100 mg once daily (call us when you need refills) and Amiodarone 200 mg once daily  Labwork: None.   Procedures/Testing: None.  Follow-Up: 6 months with Dr. Fletcher Anon.   Any Additional Special Instructions Will Be Listed Below (If Applicable).

## 2014-12-19 NOTE — Assessment & Plan Note (Signed)
There was only mild mitral regurgitation associated with this.     

## 2014-12-19 NOTE — Assessment & Plan Note (Signed)
He is back in sinus rhythm after increasing the dose of amiodarone and atenolol. He is mildly bradycardic but seems to be asymptomatic at the present time. In the past, he had fatigue with low heart rate. If this happens again, then we can consider decreasing the dose of atenolol. Anticoagulation was initiated in anticipation of cardioversion. However, given that he is back in sinus rhythm, anticoagulation was discontinued. CHADS VASc score is 0.

## 2015-01-02 ENCOUNTER — Ambulatory Visit: Payer: BLUE CROSS/BLUE SHIELD | Admitting: Cardiovascular Disease

## 2015-02-17 ENCOUNTER — Ambulatory Visit (INDEPENDENT_AMBULATORY_CARE_PROVIDER_SITE_OTHER): Payer: BLUE CROSS/BLUE SHIELD | Admitting: Podiatry

## 2015-02-17 ENCOUNTER — Encounter: Payer: Self-pay | Admitting: Podiatry

## 2015-02-17 ENCOUNTER — Ambulatory Visit (INDEPENDENT_AMBULATORY_CARE_PROVIDER_SITE_OTHER): Payer: BLUE CROSS/BLUE SHIELD

## 2015-02-17 VITALS — BP 122/68 | HR 58 | Resp 18

## 2015-02-17 DIAGNOSIS — T148 Other injury of unspecified body region: Secondary | ICD-10-CM

## 2015-02-17 DIAGNOSIS — R52 Pain, unspecified: Secondary | ICD-10-CM

## 2015-02-17 DIAGNOSIS — T148XXA Other injury of unspecified body region, initial encounter: Secondary | ICD-10-CM

## 2015-02-17 NOTE — Patient Instructions (Signed)
Avulsion Fracture of the Foot An avulsion fracture of the foot is when a piece of bone in your foot has been torn away. Bones are connected to other bones by strong bands of connective tissue (ligaments). Muscles are also connected to bones with connective tissue (tendons). Avulsion fractures occur when severe stress on a bone from a ligament or tendon causes a small piece of bone to be pulled away.  Athletes may develop an avulsion fracture of the foot gradually (chronic avulsion fracture). The heel bone and the long bone in the foot that connect to the fifth toe (fifth metatarsal bone) are common areas of avulsion fracture of the foot.  CAUSES  An avulsion fracture of the foot can be caused by a sudden or repetitive twisting of your foot or ankle. It can also occur during a fall from a standing height.  RISK FACTORS You may have a higher risk of an avulsion fracture of the foot if you:   Participate in activities during which twisting the ankle or foot are likely, such as:  Dancing.  Track and field.  Walking or hiking on uneven surfaces.  Have had diabetes for many years.  Have osteoporosis. SIGNS AND SYMPTOMS The most common symptom of an avulsion fracture of the foot is intense pain at the time of injury. You may also feel a pop or tearing. The pain continues after the injury. Other signs and symptoms may include:  Swelling.  Bruising.  Pain with movement or weight bearing.  Difficulty walking.  Pain when pressure is applied to the injured area.  Warmth over the injured area. DIAGNOSIS  An avulsion fracture of the foot may be diagnosed by:  History. Your health care provider will ask you what occurred during the time of your injury and whether you had any pain in the area before your injury.  Physical exam. During the exam, your health care provider may try to move your foot, toes, and ankle to check for pain and level of mobility.  X-ray. This will show if any bones are  fractured or out of place.  MRI. This will show your tendons and ligaments. Some avulsion fractures are associated with an injury to a tendon or ligament. TREATMENT  Treatment for an avulsion fracture of the foot depends on the size of the displaced piece of bone and how far it has been pulled out of place. Small avulsion fractures may be treated with rest and support in a cast or brace. Large fragments of bone usually need to be reattached surgically. Treatment of these fractures may also require physical therapy to regain full use of your foot.  Treatments may include:  Rest, ice, compression, and elevations (RICE treatment) as directed by your health care provider.  Medicines that reduce pain and swelling (NSAIDs).  Wearing a splint, elastic wrap, support boot, or cast as directed by your health care provider.  Crutches or a rolling scooter to support your body weight until your foot heals.  Surgery to reattach the bone and tendon or ligament.  Physical therapy. This may last for several months. HOME CARE INSTRUCTIONS  Take medicines only as directed by your health care provider.  Rest your foot until your health care provider says you can resume activity.  Keep your foot raised above the level of your heart when you are sitting or lying down.  Apply ice to the injured area:  Put ice in a plastic bag.  Place a towel between your skin and the bag.  Leave the ice on for 20 minutes, 2-3 times a day.  Do not allow your cast or splint to get wet as directed by your health care provider.  Keep all follow-up visits as directed by your health care provider. This is important. SEEK MEDICAL CARE IF:  Your pain gets worse.  You have chills or fever.  Your cast or splint is damaged.  The cast has a bad odor or has stains caused by fluids from the wound. SEEK IMMEDIATE MEDICAL CARE IF:  Your foot is cold, blue, or pale.  You have pain, swelling, redness, or numbness below your  cast or splint.   This information is not intended to replace advice given to you by your health care provider. Make sure you discuss any questions you have with your health care provider.   Document Released: 09/04/2013 Document Reviewed: 09/04/2013 Elsevier Interactive Patient Education [employer].

## 2015-02-17 NOTE — Progress Notes (Signed)
   Subjective:    Patient ID: Bobby Flowers, male    DOB: 12-Feb-1951, 64 y.o.   MRN: [id number]  HPI  64 year old male presents the office with concerns of pain to the outside portion of his right foot for which she points to the fifth metatarsal base which started suddenly approximate 6 weeks ago. He denies any specific injury or trauma to the area. He does walk quite a bit at work at [employer] on concrete floors. He states that the pain is worse with walking or ambulation have there is no pain at rest. He feels that there is a bone sticking out of the side of his foot. He said no previous treatment. No other complaints at this time.   Review of Systems  All other systems reviewed and are negative.      Objective:   Physical Exam General: AAO x3, NAD  Dermatological: Skin is warm, dry and supple bilateral. Nails x 10 are well manicured; remaining integument appears unremarkable at this time. There are no open sores, no preulcerative lesions, no rash or signs of infection present.  Vascular: Dorsalis Pedis artery and Posterior Tibial artery pedal pulses are 2/4 bilateral with immedate capillary fill time. Pedal hair growth present. No varicosities and no lower extremity edema present bilateral. There is no pain with calf compression, swelling, warmth, erythema.   Neruologic: Grossly intact via light touch bilateral. Vibratory intact via tuning fork bilateral. Protective threshold with Semmes Wienstein monofilament intact to all pedal sites bilateral. Patellar and Achilles deep tendon reflexes 2+ bilateral. No Babinski or clonus noted bilateral.   Musculoskeletal: There is tenderness palpation along the fifth metatarsal base and the right foot. There is mild edema over on this area without any significant erythema or increase in warmth. No other areas of tenderness to bilateral lower extremities. There is no pain along the course of the peroneal tendons and they appear to be intact. ROM WNL.  Muscular strength 5/5 in all groups tested bilateral.  Gait: Unassisted, Nonantalgic.      Assessment & Plan:  64 year old male right fifth metatarsal likely stress/avulsion fracture -X-rays were obtained and reviewed with the patient. Most notably on the lateral view there does appear to be an avulsion fracture the fifth metatarsal base. -Treatment options discussed including all alternatives, risks, and complications -Etiology of symptoms were discussed -At this point recommend immobilization in a cam boot. This was dispensed today. -Ice to the area -Follow-up in 3 weeks or sooner if any problems arise. In the meantime, encouraged to call the office with any questions, concerns, change in symptoms.  -May transition to an ankle brace next appointment if needed. X-ray next appointment.   Celesta Gentile, DPM

## 2015-02-27 ENCOUNTER — Other Ambulatory Visit: Payer: Self-pay | Admitting: *Deleted

## 2015-02-27 MED ORDER — ATENOLOL 100 MG PO TABS: 100.0000 mg | ORAL_TABLET | Freq: Every day | ORAL | Status: DC

## 2015-03-10 ENCOUNTER — Ambulatory Visit (INDEPENDENT_AMBULATORY_CARE_PROVIDER_SITE_OTHER): Payer: BLUE CROSS/BLUE SHIELD | Admitting: Podiatry

## 2015-03-10 ENCOUNTER — Encounter: Payer: Self-pay | Admitting: Podiatry

## 2015-03-10 ENCOUNTER — Ambulatory Visit (INDEPENDENT_AMBULATORY_CARE_PROVIDER_SITE_OTHER): Payer: BLUE CROSS/BLUE SHIELD

## 2015-03-10 VITALS — BP 118/65 | HR 51 | Resp 18

## 2015-03-10 DIAGNOSIS — R52 Pain, unspecified: Secondary | ICD-10-CM

## 2015-03-10 DIAGNOSIS — T148 Other injury of unspecified body region: Secondary | ICD-10-CM | POA: Diagnosis not present

## 2015-03-10 DIAGNOSIS — M779 Enthesopathy, unspecified: Secondary | ICD-10-CM

## 2015-03-10 DIAGNOSIS — T148XXA Other injury of unspecified body region, initial encounter: Secondary | ICD-10-CM

## 2015-03-15 ENCOUNTER — Encounter: Payer: Self-pay | Admitting: Podiatry

## 2015-03-15 DIAGNOSIS — T148XXA Other injury of unspecified body region, initial encounter: Secondary | ICD-10-CM | POA: Insufficient documentation

## 2015-03-15 DIAGNOSIS — M779 Enthesopathy, unspecified: Secondary | ICD-10-CM | POA: Insufficient documentation

## 2015-03-15 NOTE — Progress Notes (Signed)
Patient ID: Bobby Flowers, male   DOB: 1950-05-29, 65 y.o.   MRN: [id number]  Subjective: 65 year old male presents the office today for follow-up evaluation of pain to the right foot. He states that since last appointment his pain is completely resolved and he is having no pain. Denies any swelling or redness. No recent injury or trauma he can recall. Denies any systemic complaints such as fevers, chills, nausea, vomiting. No acute changes since last appointment, and no other complaints at this time.   Objective: AAO x3, NAD DP/PT pulses palpable bilaterally, CRT less than 3 seconds Protective sensation intact with Simms Weinstein monofilament At this time there is no tenderness to palpation over the right fifth metatarsal base or to other areas of the foot. There is no overlying edema, erythema, increase in warmth. There is no pain with vibratory sensation. No areas of pinpoint bony tenderness or pain with vibratory sensation. MMT 5/5, ROM WNL. No edema, erythema, increase in warmth to bilateral lower extremities.  No open lesions or pre-ulcerative lesions.  No pain with calf compression, swelling, warmth, erythema  Assessment: Resolved right foot pain  Plan: -All treatment options discussed with the patient including all alternatives, risks, complications.  -X-rays were obtained and reviewed with the patient. There does continue to be fragmentation and likely fracture the right fifth metatarsal base although likely chronic. There is no pain to the area at this time. Discussed with him that if symptoms recur may need surgical intervention to remove the fracture fragment and repair the peroneal tendon however we will hold off on that for now. Continue supportive shoe unit discussed orthotics. -Follow-up as needed. -Patient encouraged to call the office with any questions, concerns, change in symptoms.   Celesta Gentile, DPM

## 2015-05-22 HISTORY — PX: HERNIA REPAIR: SHX51

## 2015-06-03 ENCOUNTER — Encounter: Payer: Self-pay | Admitting: *Deleted

## 2015-06-23 ENCOUNTER — Ambulatory Visit: Payer: BLUE CROSS/BLUE SHIELD | Admitting: General Surgery

## 2015-07-01 ENCOUNTER — Encounter: Payer: Self-pay | Admitting: General Surgery

## 2015-07-01 ENCOUNTER — Ambulatory Visit (INDEPENDENT_AMBULATORY_CARE_PROVIDER_SITE_OTHER): Payer: BLUE CROSS/BLUE SHIELD | Admitting: General Surgery

## 2015-07-01 VITALS — BP 108/72 | HR 58 | Resp 12 | Ht 70.0 in | Wt 170.0 lb

## 2015-07-01 DIAGNOSIS — Z1211 Encounter for screening for malignant neoplasm of colon: Secondary | ICD-10-CM | POA: Diagnosis not present

## 2015-07-01 MED ORDER — POLYETHYLENE GLYCOL 3350 17 GM/SCOOP PO POWD: ORAL | Status: DC

## 2015-07-01 NOTE — Progress Notes (Signed)
Patient ID: Bobby Flowers, male   DOB: 07-07-1950, 65 y.o.   MRN: [id number]  Chief Complaint  Patient presents with  . Colonoscopy    HPI Bobby Flowers is a 64 y.o. male here today for a evaluation of a screening colonoscopy. Last colonoscopy was done on 04/25/05. Patient states no GI problems at this time. HPI  Past Medical History  Diagnosis Date  . Mitral valve prolapse     With mild regurgitation  . History of kidney stones   . Hypothyroid   . Persistent atrial fibrillation ([place])   . Thyroid disease     Past Surgical History  Procedure Laterality Date  . Nasal sinus surgery    . Hemorroidectomy    . Colonoscopy  04/25/05    Family History  Problem Relation Age of Onset  . Heart failure Mother     Social History Social History  Substance Use Topics  . Smoking status: Former Smoker -- 1.50 packs/day for 31 years    Types: Cigarettes  . Smokeless tobacco: None  . Alcohol Use: No    Allergies  Allergen Reactions  . Coumadin [Warfarin] Rash    Current Outpatient Prescriptions  Medication Sig Dispense Refill  . amiodarone (PACERONE) 200 MG tablet Take 1 tablet (200 mg total) by mouth daily. 90 tablet 3  . atenolol (TENORMIN) 100 MG tablet Take 1 tablet (100 mg total) by mouth daily. 90 tablet 3  . levothyroxine (SYNTHROID, LEVOTHROID) 75 MCG tablet Take 75 mcg by mouth daily before breakfast.     . polyethylene glycol powder (GLYCOLAX/MIRALAX) powder 255 grams one bottle for colonoscopy prep 255 g 0  . SUMAtriptan (IMITREX) 100 MG tablet Take 1 tablet by mouth daily as needed. Reported on 07/01/2015     No current facility-administered medications for this visit.    Review of Systems Review of Systems  Constitutional: Negative.   Respiratory: Negative.   Cardiovascular: Negative.     Blood pressure 108/72, pulse 58, resp. rate 12, height 5\' 10"  (1.778 m), weight 170 lb (77.111 kg).  Physical Exam Physical Exam  Constitutional: He is oriented to person,  place, and time. He appears well-developed and well-nourished.  Eyes: Conjunctivae are normal. No scleral icterus.  Neck: Neck supple.  Cardiovascular: Normal rate, regular rhythm and normal heart sounds.   Pulmonary/Chest: Effort normal and breath sounds normal.  Lymphadenopathy:    He has no cervical adenopathy.  Neurological: He is alert and oriented to person, place, and time.  Skin: Skin is warm and dry.    Data Reviewed 2007 colonoscopy showed a 5 mm polyp in the rectum. Pathology showed this to be hyperplastic.  Assessment    Candidate for screening colonoscopy.    Plan         Colonoscopy with possible biopsy/polypectomy prn: Information regarding the procedure, including its potential risks and complications (including but not limited to perforation of the bowel, which may require emergency surgery to repair, and bleeding) was verbally given to the patient. Educational information regarding lower intestinal endoscopy was given to the patient. Written instructions for how to complete the bowel prep using Miralax were provided. The importance of drinking ample fluids to avoid dehydration as a result of the prep emphasized.  Patient has been scheduled for a colonoscopy on 07-22-15 at [facility].   PCP:  Kary Kos   This information has been scribed by Gaspar Cola CMA.  Robert Bellow 07/01/2015, 9:08 PM

## 2015-07-01 NOTE — Patient Instructions (Addendum)
Colonoscopy A colonoscopy is an exam to look at your colon. This exam can help find lumps (tumors), growths (polyps), bleeding, and redness and puffiness (inflammation) in your colon.  BEFORE THE PROCEDURE  Ask your doctor about changing or stopping your regular medicines.  You may need to drink a large amount of a special liquid (oral bowel prep). You start drinking this the day before your procedure. It will cause you to have watery poop (stool). This cleans out your colon.  Do not eat or drink anything else once you have started the bowel prep, unless your doctor tells you it is safe to do so.  Make plans for someone to drive you home after the procedure. PROCEDURE  You will be given medicine to help you relax (sedative).  You will lie on your side with your knees bent.  A tube with a camera on the end is put in the opening of your butt (anus) and into your colon. Pictures are sent to a computer screen. Your doctor will look for anything that is not normal.  Your doctor may take a tissue sample (biopsy) from your colon to be looked at more closely.  The exam is finished when your doctor has viewed all of the colon. AFTER THE PROCEDURE  Do not drive for 24 hours after the exam.  You may have a small amount of blood in your poop. This is normal.  You may pass gas and have belly (abdominal) cramps. This is normal.  Ask when your test results will be ready. Make sure you get your test results.   This information is not intended to replace advice given to you by your health care provider. Make sure you discuss any questions you have with your health care provider.   Document Released: 03/12/2010 Document Revised: 02/12/2013 Document Reviewed: 10/15/2012 Elsevier Interactive Patient Education [employer].  Patient has been scheduled for a colonoscopy on 07-22-15 at [facility].

## 2015-07-01 NOTE — H&P (Signed)
HPI  Bobby Flowers is a 65 y.o. male here today for a evaluation of a screening colonoscopy. Last colonoscopy was done on 04/25/05. Patient states no GI problems at this time.  HPI  Past Medical History   Diagnosis  Date   .  Mitral valve prolapse      With mild regurgitation   .  History of kidney stones    .  Hypothyroid    .  Persistent atrial fibrillation ([place])    .  Thyroid disease     Past Surgical History   Procedure  Laterality  Date   .  Nasal sinus surgery     .  Hemorroidectomy     .  Colonoscopy   04/25/05    Family History   Problem  Relation  Age of Onset   .  Heart failure  Mother     Social History  Social History   Substance Use Topics   .  Smoking status:  Former Smoker -- 1.50 packs/day for 31 years     Types:  Cigarettes   .  Smokeless tobacco:  None   .  Alcohol Use:  No    Allergies   Allergen  Reactions   .  Coumadin [Warfarin]  Rash    Current Outpatient Prescriptions   Medication  Sig  Dispense  Refill   .  amiodarone (PACERONE) 200 MG tablet  Take 1 tablet (200 mg total) by mouth daily.  90 tablet  3   .  atenolol (TENORMIN) 100 MG tablet  Take 1 tablet (100 mg total) by mouth daily.  90 tablet  3   .  levothyroxine (SYNTHROID, LEVOTHROID) 75 MCG tablet  Take 75 mcg by mouth daily before breakfast.     .  polyethylene glycol powder (GLYCOLAX/MIRALAX) powder  255 grams one bottle for colonoscopy prep  255 g  0   .  SUMAtriptan (IMITREX) 100 MG tablet  Take 1 tablet by mouth daily as needed. Reported on 07/01/2015      No current facility-administered medications for this visit.    Review of Systems  Review of Systems  Constitutional: Negative.  Respiratory: Negative.  Cardiovascular: Negative.   Blood pressure 108/72, pulse 58, resp. rate 12, height 5\' 10"  (1.778 m), weight 170 lb (77.111 kg).  Physical Exam  Physical Exam  Constitutional: He is oriented to person, place, and time. He appears well-developed and well-nourished.  Eyes:  Conjunctivae are normal. No scleral icterus.  Neck: Neck supple.  Cardiovascular: Normal rate, regular rhythm and normal heart sounds.  Pulmonary/Chest: Effort normal and breath sounds normal.  Lymphadenopathy:  He has no cervical adenopathy.  Neurological: He is alert and oriented to person, place, and time.  Skin: Skin is warm and dry.   Data Reviewed  2007 colonoscopy showed a 5 mm polyp in the rectum. Pathology showed this to be hyperplastic.  Assessment   Candidate for screening colonoscopy.   Plan    Colonoscopy with possible biopsy/polypectomy prn: Information regarding the procedure, including its potential risks and complications (including but not limited to perforation of the bowel, which may require emergency surgery to repair, and bleeding) was verbally given to the patient. Educational information regarding lower intestinal endoscopy was given to the patient. Written instructions for how to complete the bowel prep using Miralax were provided. The importance of drinking ample fluids to avoid dehydration as a result of the prep emphasized.  Patient has been scheduled for a colonoscopy on 07-22-15 at  [facility].  PCP: Kary Kos  This information has been scribed by Gaspar Cola CMA.  Robert Bellow  07/01/2015, 9:08 PM

## 2015-07-09 ENCOUNTER — Encounter: Payer: Self-pay | Admitting: General Surgery

## 2015-07-09 ENCOUNTER — Ambulatory Visit (INDEPENDENT_AMBULATORY_CARE_PROVIDER_SITE_OTHER): Payer: BLUE CROSS/BLUE SHIELD | Admitting: General Surgery

## 2015-07-09 VITALS — BP 124/66 | HR 72 | Resp 12 | Ht 70.0 in | Wt 171.0 lb

## 2015-07-09 DIAGNOSIS — K4090 Unilateral inguinal hernia, without obstruction or gangrene, not specified as recurrent: Secondary | ICD-10-CM | POA: Diagnosis not present

## 2015-07-09 NOTE — Progress Notes (Signed)
Patient ID: Bobby Flowers, male   DOB: 1950-02-27, 65 y.o.   MRN: [id number]  Chief Complaint  Patient presents with  . Other    hernia    HPI Bobby Flowers is a 65 y.o. male here today for a evaluation of a left inguinal hernia. Patient states he noticed this area on Tuesday 07/07/15. He describes the pain as dull. It feels better when he is sitting. He scheduled for a colonoscopy on 07/22/15. No problems urinating or moving bowels.  HPI  Past Medical History  Diagnosis Date  . Mitral valve prolapse     With mild regurgitation  . History of kidney stones   . Hypothyroid   . Persistent atrial fibrillation ([place])   . Thyroid disease     Past Surgical History  Procedure Laterality Date  . Nasal sinus surgery    . Hemorroidectomy    . Colonoscopy  04/25/05    Family History  Problem Relation Age of Onset  . Heart failure Mother     Social History Social History  Substance Use Topics  . Smoking status: Former Smoker -- 1.50 packs/day for 31 years    Types: Cigarettes  . Smokeless tobacco: None  . Alcohol Use: No    Allergies  Allergen Reactions  . Coumadin [Warfarin] Rash    Current Outpatient Prescriptions  Medication Sig Dispense Refill  . amiodarone (PACERONE) 200 MG tablet Take 1 tablet (200 mg total) by mouth daily. 90 tablet 3  . atenolol (TENORMIN) 100 MG tablet Take 1 tablet (100 mg total) by mouth daily. 90 tablet 3  . levothyroxine (SYNTHROID, LEVOTHROID) 75 MCG tablet Take 75 mcg by mouth daily before breakfast.     . SUMAtriptan (IMITREX) 100 MG tablet Take 1 tablet by mouth daily as needed. Reported on 07/01/2015     No current facility-administered medications for this visit.    Review of Systems Review of Systems  Constitutional: Negative.   Respiratory: Negative.   Cardiovascular: Negative.     Blood pressure 124/66, pulse 72, resp. rate 12, height 5\' 10"  (1.778 m), weight 171 lb (77.565 kg).  Physical Exam Physical Exam  Constitutional: He is  oriented to person, place, and time. He appears well-developed and well-nourished.  Cardiovascular: Normal rate and regular rhythm.   Murmur heard.  Systolic murmur is present with a grade of 2/6   Diastolic murmur is present with a grade of 6/6  Pulmonary/Chest: Effort normal and breath sounds normal.  Abdominal: Soft. Bowel sounds are normal. A hernia is present. Hernia confirmed positive in the left inguinal area.  Neurological: He is alert and oriented to person, place, and time.  Skin: Skin is warm and dry.    Data Reviewed 06/08/2015 CBC and comprehensive metabolic panel obtained in Dr. Verneda Skill office were reviewed. All normal.  Assessment    New-onset left inguinal hernia. Presently scheduled for colonoscopy on 07/22/2015.    Plan        Hernia precautions and incarceration were discussed with the patient. If they develop symptoms of an incarcerated hernia, they were encouraged to seek prompt medical attention.  I have recommended repair of the hernia using mesh on an outpatient basis in the near future. The risk of infection was reviewed. The role of prosthetic mesh to minimize the risk of recurrence was reviewed.  The patient will have his colonoscopy is scheduled and then follow-up same date with left inguinal hernia repair.   Plan to schedule to repair hernia same time  as colonoscopy. This is currently scheduled at [facility] for 07-22-15.   PCP:  Tilford Pillar, Forest Gleason 07/11/2015, 11:04 AM

## 2015-07-09 NOTE — Patient Instructions (Addendum)
Follow up as scheduled.  

## 2015-07-11 DIAGNOSIS — K4090 Unilateral inguinal hernia, without obstruction or gangrene, not specified as recurrent: Secondary | ICD-10-CM | POA: Insufficient documentation

## 2015-07-11 NOTE — H&P (Signed)
HPI  Bobby Flowers is a 65 y.o. male here today for a evaluation of a left inguinal hernia. Patient states he noticed this area on Tuesday 07/07/15. He describes the pain as dull. It feels better when he is sitting. He scheduled for a colonoscopy on 07/22/15. No problems urinating or moving bowels.  HPI  Past Medical History   Diagnosis  Date   .  Mitral valve prolapse      With mild regurgitation   .  History of kidney stones    .  Hypothyroid    .  Persistent atrial fibrillation ([place])    .  Thyroid disease     Past Surgical History   Procedure  Laterality  Date   .  Nasal sinus surgery     .  Hemorroidectomy     .  Colonoscopy   04/25/05    Family History   Problem  Relation  Age of Onset   .  Heart failure  Mother     Social History  Social History   Substance Use Topics   .  Smoking status:  Former Smoker -- 1.50 packs/day for 31 years     Types:  Cigarettes   .  Smokeless tobacco:  None   .  Alcohol Use:  No    Allergies   Allergen  Reactions   .  Coumadin [Warfarin]  Rash    Current Outpatient Prescriptions   Medication  Sig  Dispense  Refill   .  amiodarone (PACERONE) 200 MG tablet  Take 1 tablet (200 mg total) by mouth daily.  90 tablet  3   .  atenolol (TENORMIN) 100 MG tablet  Take 1 tablet (100 mg total) by mouth daily.  90 tablet  3   .  levothyroxine (SYNTHROID, LEVOTHROID) 75 MCG tablet  Take 75 mcg by mouth daily before breakfast.     .  SUMAtriptan (IMITREX) 100 MG tablet  Take 1 tablet by mouth daily as needed. Reported on 07/01/2015      No current facility-administered medications for this visit.    Review of Systems  Review of Systems  Constitutional: Negative.  Respiratory: Negative.  Cardiovascular: Negative.   Blood pressure 124/66, pulse 72, resp. rate 12, height 5\' 10"  (1.778 m), weight 171 lb (77.565 kg).  Physical Exam  Physical Exam  Constitutional: He is oriented to person, place, and time. He appears well-developed and well-nourished.   Cardiovascular: Normal rate and regular rhythm.  Murmur heard.  Systolic murmur is present with a grade of 2/6  Diastolic murmur is present with a grade of 6/6  Pulmonary/Chest: Effort normal and breath sounds normal.  Abdominal: Soft. Bowel sounds are normal. A hernia is present. Hernia confirmed positive in the left inguinal area.  Neurological: He is alert and oriented to person, place, and time.  Skin: Skin is warm and dry.   Data Reviewed  06/08/2015 CBC and comprehensive metabolic panel obtained in Dr. Verneda Skill office were reviewed. All normal.  Assessment   New-onset left inguinal hernia. Presently scheduled for colonoscopy on 07/22/2015.   Plan    Hernia precautions and incarceration were discussed with the patient. If they develop symptoms of an incarcerated hernia, they were encouraged to seek prompt medical attention.  I have recommended repair of the hernia using mesh on an outpatient basis in the near future. The risk of infection was reviewed. The role of prosthetic mesh to minimize the risk of recurrence was reviewed.  The patient will  have his colonoscopy is scheduled and then follow-up same date with left inguinal hernia repair.  Plan to schedule to repair hernia same time as colonoscopy. This is currently scheduled at [facility] for 07-22-15.  PCP: Tilford Pillar, Forest Gleason  07/11/2015, 11:04 AM

## 2015-07-15 ENCOUNTER — Encounter: Payer: Self-pay | Admitting: *Deleted

## 2015-07-15 ENCOUNTER — Other Ambulatory Visit: Payer: BLUE CROSS/BLUE SHIELD

## 2015-07-15 NOTE — Patient Instructions (Signed)
  Your procedure is scheduled on: 07-22-15  Report to [facility] To find out your arrival time please call [phone] between 1PM - 3PM on 07-21-15  Remember: Instructions that are not followed completely may result in serious medical risk, up to and including death, or upon the discretion of your surgeon and anesthesiologist your surgery may need to be rescheduled.    _X___ 1. Do not eat food or drink liquids after midnight. No gum chewing or hard candies.     _X___ 2. No Alcohol for 24 hours before or after surgery.   ____ 3. Bring all medications with you on the day of surgery if instructed.    _X___ 4. Notify your doctor if there is any change in your medical condition     (cold, fever, infections).     Do not wear jewelry, make-up, hairpins, clips or nail polish.  Do not wear lotions, powders, or perfumes. You may wear deodorant.  Do not shave 48 hours prior to surgery. Men may shave face and neck.  Do not bring valuables to the hospital.    [facility] is not responsible for any belongings or valuables.               Contacts, dentures or bridgework may not be worn into surgery.  Leave your suitcase in the car. After surgery it may be brought to your room.  For patients admitted to the hospital, discharge time is determined by your  treatment team.   Patients discharged the day of surgery will not be allowed to drive home.   Please read over the following fact sheets that you were given:     _X___ Take these medicines the morning of surgery with A SIP OF WATER:    1. AMIODARONE  2. ATENOLOL   3. LEVOTHYROXINE  4.  5.  6.  ____ Fleet Enema (as directed)   ____ Use CHG Soap as directed  ____ Use inhalers on the day of surgery  ____ Stop metformin 2 days prior to surgery    ____ Take 1/2 of usual insulin dose the night before surgery and none on the morning of surgery.   ____ Stop Coumadin/Plavix/aspirin-N/A  ____ Stop  Anti-inflammatories   ____ Stop supplements until after surgery.    ____ Bring C-Pap to the hospital.

## 2015-07-15 NOTE — Pre-Procedure Instructions (Signed)
Expand All Collapse All   There was only mild mitral regurgitation associated with this.

## 2015-07-15 NOTE — Pre-Procedure Instructions (Signed)
Persistent atrial fibrillation - Assessment & Plan Note   Bobby Flowers (MR# [id number])      Persistent atrial fibrillation - Assessment & Plan Note Info    Author Note Status Last Update User Last Update Date/Time   [name], MD Written [name], MD 12/19/2014 4:45 PM    Persistent atrial fibrillation - Assessment & Plan Note    Expand All Collapse All   He is back in sinus rhythm after increasing the dose of amiodarone and atenolol. He is mildly bradycardic but seems to be asymptomatic at the present time. In the past, he had fatigue with low heart rate. If this happens again, then we can consider decreasing the dose of atenolol. Anticoagulation was initiated in anticipation of cardioversion. However, given that he is back in sinus rhythm, anticoagulation was discontinued. CHADS VASc score is 0.

## 2015-07-21 ENCOUNTER — Encounter: Payer: Self-pay | Admitting: *Deleted

## 2015-07-21 NOTE — Pre-Procedure Instructions (Signed)
Bobby Hampshire, MD at 12/19/2014 3:44 PM     Status: Signed       Expand All Collapse All     Primary care physician: Dr. Kary Kos  HPI  This is a 65 year old man who is here today for a followup visit regarding persistent atrial fibrillation and mitral valve prolapse with mild regurgitation. He has prolonged history of atrial fibrillation which was diagnosed in the 28s. There was questionable severe mitral valve prolapse on initial echocardiogram but repeat evaluation at [facility] was unremarkable. He was told that the mitral valve prolapse was mild. There was also questionable intercardiac thrombus but that was excluded by a TEE. He was briefly on warfarin but had an allergic reaction with a rash. He has not been on any other blood thinners since then given his low CHADS2 VASc score.  He quit smoking in 2002 and does not consume alcohol. There is no family history of premature coronary artery disease. He had multiple stress test throughout the years but none recently.  Echocardiogram in may of 2015 showed an ejection fraction of 55-60% with no evidence of left ventricular hypertrophy. There was moderate mitral valve prolapse with only mild regurgitation. There was mild biatrial enlargement. He was seen by Dr. Rayann Heman in 2015 for consultation regarding possible catheter ablation for atrial fibrillation. Given stability on low-dose amiodarone, which was best decided to continue with medical therapy. He had recurrent atrial fibrillation recently. The dose of amiodarone was increased to 200 mg once daily. The dose of atenolol was also increased and currently he is taking 100 mg once daily. He converted to sinus rhythm chemically and feels back to baseline.   Allergies  Allergen Reactions  . Coumadin [Warfarin] Rash     Current Outpatient Prescriptions on File Prior to Visit  Medication Sig Dispense Refill  . amiodarone (PACERONE) 200 MG tablet Take 1 tablet (200 mg total)  by mouth daily. 90 tablet 3  . atenolol (TENORMIN) 50 MG tablet Take 1.5 tablets (75 mg total) by mouth daily. (Patient taking differently: Take 100 mg by mouth daily. ) 135 tablet 3  . levothyroxine (SYNTHROID, LEVOTHROID) 75 MCG tablet Take 75 mcg by mouth daily before breakfast.     . SUMAtriptan (IMITREX) 100 MG tablet Take 1 tablet by mouth daily as needed. Migraines    . rivaroxaban (XARELTO) 20 MG TABS tablet Take 1 tablet (20 mg total) by mouth daily with supper. (Patient not taking: Reported on 12/19/2014) 30 tablet 1   No current facility-administered medications on file prior to visit.     Past Medical History  Diagnosis Date  . Mitral valve prolapse     With mild regurgitation  . History of kidney stones   . Hypothyroid   . Persistent atrial fibrillation ([place])   . Thyroid disease      Past Surgical History  Procedure Laterality Date  . Nasal sinus surgery    . Hemorroidectomy       Family History  Problem Relation Age of Onset  . Heart failure Mother      Social History   Social History  . Marital Status: Married    Spouse Name: N/A  . Number of Children: N/A  . Years of Education: N/A   Occupational History  . Not on file.   Social History Main Topics  . Smoking status: Former Smoker -- 1.50 packs/day for 31 years    Types: Cigarettes  . Smokeless tobacco: Not on file  . Alcohol Use: No  .  Drug Use: No  . Sexual Activity: Not on file   Other Topics Concern  . Not on file   Social History Narrative     ROS A 10 point review of system was performed. It is negative other than that mentioned in the history of present illness.   PHYSICAL EXAM   BP 108/70 mmHg  Pulse 49  Ht 5\' 9"  (1.753 m)  Wt 174 lb 8 oz (79.153 kg)  BMI 25.76 kg/m2 Constitutional: He is oriented to person, place, and time. He appears  well-developed and well-nourished. No distress.  HENT: No nasal discharge.  Head: Normocephalic and atraumatic.  Eyes: Pupils are equal and round. No discharge. Neck: Normal range of motion. Neck supple. No JVD present. No thyromegaly present.  Cardiovascular: Bradycardic, regular rhythm, normal heart sounds. Exam reveals no gallop and no friction rub. There is a faint 1/6 holosystolic murmur at the apex  Pulmonary/Chest: Effort normal and breath sounds normal. No stridor. No respiratory distress. He has no wheezes. He has no rales. He exhibits no tenderness.  Abdominal: Soft. Bowel sounds are normal. He exhibits no distension. There is no tenderness. There is no rebound and no guarding.  Musculoskeletal: Normal range of motion. He exhibits no edema and no tenderness.  Neurological: He is alert and oriented to person, place, and time. Coordination normal.  Skin: Skin is warm and dry. No rash noted. He is not diaphoretic. No erythema. No pallor.  Psychiatric: He has a normal mood and affect. His behavior is normal. Judgment and thought content normal.       EKG: Sinus bradycardia with first-degree AV block. Old anteroseptal infarct.  ASSESSMENT AND PLAN              Persistent atrial fibrillation - Bobby Hampshire, MD at 12/19/2014 4:45 PM     Status: Written Related Problem: Persistent atrial fibrillation   Expand All Collapse All   He is back in sinus rhythm after increasing the dose of amiodarone and atenolol. He is mildly bradycardic but seems to be asymptomatic at the present time. In the past, he had fatigue with low heart rate. If this happens again, then we can consider decreasing the dose of atenolol. Anticoagulation was initiated in anticipation of cardioversion. However, given that he is back in sinus rhythm, anticoagulation was discontinued. CHADS VASc score is 0.            Mitral valve prolapse - Bobby Hampshire, MD at 12/19/2014 4:45 PM       Status: Written Related Problem: Mitral valve prolapse   Expand All Collapse All   There was only mild mitral regurgitation associated with this.                   Referring Provider     [name], MD     Diagnoses     Persistent atrial fibrillation ([place]) - Primary    ICD-9-CM: 427.31 ICD-10-CM: I48.1    Mitral valve prolapse     ICD-9-CM: 424.0 ICD-10-CM: I34.1       Reason for Visit     Other    Hx afib. Pt mentioned he broke out of afib after increasing his atenolol and would also like all future medications mailorder/90 day. Meds reviewed verbally.     Reason for Visit History        Ordered Medications       Disp Refills Start End    atenolol (TENORMIN) 100 MG tablet (Discontinued) 90 tablet  3 12/19/2014 02/27/2015    Take 1 tablet (100 mg total) by mouth daily. - Oral    Reason for Discontinue: Reorder      Discontinued Medications       Reason for Discontinue    rivaroxaban (XARELTO) 20 MG TABS tablet     atenolol (TENORMIN) 50 MG tablet       Medication Notes     rivaroxaban (XARELTO) 20 MG TABS tablet     Britt Bottom, [facility] 12/19/2014 3:27 PM Pt has not taken in a week due to samples running out.        Level of Service     PR OFFICE OUTPATIENT VISIT 15 MINUTES [99213]      Follow-up and Disposition     Return in about 6 months (around 06/19/2015).       All Charges for This Encounter     Code Description Service Date Service Provider Modifiers Qty    93000 PR ELECTROCARDIOGRAM, COMPLETE 12/19/2014 Bobby Hampshire, MD  1    [phone] PR OFFICE OUTPATIENT VISIT 15 MINUTES 12/19/2014 Bobby Hampshire, MD  1      AVS Reports     Date/Time Report Action User    12/19/2014 3:58 PM After Visit Summary Printed Bobby Hampshire, MD      Patient Instructions     Medication Instructions: Continue with Atenolol 100 mg once daily (call us when you need refills)  and Amiodarone 200 mg once daily  Labwork: None.   Procedures/Testing: None.  Follow-Up: 6 months with Dr. Fletcher Anon.   Any Additional Special Instructions Will Be Listed Below (If Applicable).         Routing History     There are no sent or routed communications associated with this encounter.     Previous Visit       Provider Department Encounter #    11/20/2014 2:25 PM [name], MD [facility] [id number]

## 2015-07-21 NOTE — Pre-Procedure Instructions (Signed)
Echocardiogram without contrast  Order# [id number]   Ordering physician: [name], MD  Study date: 07/09/13      Result Notes    Notes Recorded by Tracie Harrier, RN on 07/12/2013 at 10:57 AM Reviewed results with patient.   ------  Notes Recorded by Tracie Harrier, RN on 07/12/2013 at 9:36 AM LVM 5/22 ------  Notes Recorded by [name], MD on 07/10/2013 at 3:57 PM Inform patient that echo was fine. EF is normal. He does have mitral valve prolapse but leaking in the valve is only mild. ------  Notes Recorded by Tracie Harrier, RN on 07/09/2013 at 4:15 PM RN reviewed. Forwarded to MD.    [name] Result    Result status: Final result                  *[facility]*         [address] [place]            [place], [place] [address]              [phone]  ------------------------------------------------------------------- Transthoracic Echocardiography  Patient:  Bobby, Flowers MR #:    [id number] Study Date: 07/09/2013 Gender:   M Age:    65 Height:   177.8 cm Weight:   75.3 kg BSA:    1.93 m^2 Pt. Status: Room:  ATTENDING  Oda Cogan, Muhammad REFERRING  [name] SONOGRAPHER Timmothy Sours, RDCS, RVT PERFORMING  [facility], [place]  cc:  ------------------------------------------------------------------- LV EF: 55% -  60%  ------------------------------------------------------------------- History:  PMH: Acquired from the patient and from the patient&'s chart. Dyspnea. Atrial fibrillation. Mitral valve disease. Risk factors: Former tobacco use.  ------------------------------------------------------------------- Study Conclusions  - Left ventricle: The cavity size was normal. Wall thickness was normal. Systolic function was normal. The estimated ejection fraction was in the range of 55% to 60%. Wall motion was  normal; there were no regional wall motion abnormalities. Doppler parameters are consistent with abnormal left ventricular relaxation (grade 1 diastolic dysfunction). - Mitral valve: Moderate prolapse, involving the anterior leaflet and the posterior leaflet. There was mild regurgitation. - Left atrium: The atrium was mildly dilated. - Right atrium: The atrium was mildly dilated. - Pulmonary arteries: Systolic pressure was within the normal range.  -------------------------------------------------------------------  ------------------------------------------------------------------- Left ventricle: The cavity size was normal. Wall thickness was normal. Systolic function was normal. The estimated ejection fraction was in the range of 55% to 60%. Wall motion was normal; there were no regional wall motion abnormalities. Doppler parameters are consistent with abnormal left ventricular relaxation (grade 1 diastolic dysfunction).  ------------------------------------------------------------------- Aortic valve:  Trileaflet; normal thickness leaflets. Mobility was not restricted. Doppler: Transvalvular velocity was within the normal range. There was no stenosis. There was no regurgitation. Mean gradient (S): 4 mm Hg.  ------------------------------------------------------------------- Aorta: Aortic root: The aortic root was normal in size.  ------------------------------------------------------------------- Mitral valve:  Mildly thickened leaflets . Mobility was not restricted. Moderate prolapse, involving the anterior leaflet and the posterior leaflet. Doppler: Transvalvular velocity was within the normal range. There was no evidence for stenosis. There was mild regurgitation.  Peak gradient (D): 3 mm Hg.  ------------------------------------------------------------------- Left atrium: The atrium was mildly  dilated.  ------------------------------------------------------------------- Right ventricle: The cavity size was normal. Wall thickness was normal. Systolic function was normal.  ------------------------------------------------------------------- Pulmonic valve:  Doppler: Transvalvular velocity was within the normal range. There was no evidence for stenosis.  ------------------------------------------------------------------- Tricuspid valve:  Structurally normal valve.  Doppler: Transvalvular velocity was within the normal range. There was trivial regurgitation.  ------------------------------------------------------------------- Pulmonary artery:  The main pulmonary artery was normal-sized. Systolic pressure was within the normal range.  ------------------------------------------------------------------- Right atrium: The atrium was mildly dilated.  ------------------------------------------------------------------- Pericardium: There was no pericardial effusion.  ------------------------------------------------------------------- Systemic veins: Inferior vena cava: The vessel was normal in size.  ------------------------------------------------------------------- Prepared and Electronically Authenticated by  [name] 2015-05-19T13:03:03  ------------------------------------------------------------------- Measurements  Left ventricle             Value    Reference LV ID, ED, PLAX chordal     (L)   4.5  cm   43.0 - 52.0 LV ID, ES, PLAX chordal     (L)   3.2  cm   23.0 - 38.0 LV fx shortening, PLAX chordal (N)   29  %   >=29 LV PW thickness, ED           1.2  cm   ----------- IVS/LV PW ratio, ED       (N)   0.72     <=1.3  Ventricular septum           Value    Reference IVS thickness, ED            0.9  cm   -----------  Aortic valve               Value    Reference Aortic valve mean velocity, S      0.92 m/sec ----------- Aortic valve VTI, S           26.5 cm   ----------- Aortic mean gradient, S         4   mm Hg -----------  Aorta                  Value    Reference Aortic root ID, ED           3.1  cm   -----------  Left atrium               Value    Reference LA ID, A-P, ES             3.1  cm   ----------- LA ID/bsa, A-P         (N)   1.6  cm/m^2 <=2.2 LA volume, S              61  ml   ----------- LA volume/bsa, S            32  ml/m^2 -----------  Mitral valve              Value    Reference Mitral E-wave peak velocity       0.85 m/sec ----------- Mitral A-wave peak velocity       0.92 m/sec ----------- Mitral deceleration time    (H)   264  ms   150 - 230 Mitral peak gradient, D         3   mm Hg ----------- Mitral E/A ratio, peak         0.9     -----------  Pulmonic valve             Value    Reference Pulmonic valve peak velocity, S     1   m/sec -----------  Legend: (L) and (H) mark values outside specified reference range.  (N) marks values inside specified reference range.    Patient  Information    Patient Name Sex DOB SSN   Bobby, Flowers Male [birth year] [id number]    Reason For Exam  Priority: Routine   Dx: A-fib ([place]) [I48.91 (ICD-10-CM)]; Dyspnea [R06.00 (ICD-10-CM)]    Performing Technologist/Nurse    Performing Technologist/Nurse:       Order-Level Documents:    There are no order-level documents.    Encounter-Level Documents - 07/09/2013:      Electronic signature on 07/09/2013 11:00 AM    Printable Result Report    Result Report    2D Echocardiogram without contrast (Order [id number])   Echocardiography  Order: [id number]   Released By: Sharene Butters  Authorizing: [name], MD   Date: 07/09/2013  Department: [facility]       Result Notes     Notes Recorded by Tracie Harrier, RN on 07/12/2013 at 10:57 AM Reviewed results with patient.   ------  Notes Recorded by Tracie Harrier, RN on 07/12/2013 at 9:36 AM LVM 5/22 ------  Notes Recorded by [name], MD on 07/10/2013 at 3:57 PM Inform patient that echo was fine. EF is normal. He does have mitral valve prolapse but leaking in the valve is only mild. ------  Notes Recorded by Tracie Harrier, RN on 07/09/2013 at 4:15 PM RN reviewed. Forwarded to MD.     Order Information     Order Date/Time Release Date/Time Start Date/Time End Date/Time    06/28/13 03:56 PM 07/09/13 11:00 AM 07/09/13 11:00 AM None      Order Details     Frequency Duration Priority Order Class    None None Routine Clinic Performed      Acc#    [id number]      Order Questions     Question Answer Comment    Type of Echo Complete     Where should this test be performed [facility]     Reason for exam-Echo [place] Fibrillation 427.31      Dyspnea 786.09       Associated Diagnoses       ICD-9-CM ICD-10-CM    A-fib [facility])    427.31 I48.91    Dyspnea    786.09 R06.00      Authorizing Provider [address]     Date/Time Authorizing Provider Changed by    07/09/2013 11:00 AM [name], MD Tracie Harrier, RN      Order Requisition     2D ECHOCARDIOGRAM WITHOUT CONTRAST (Order [id number]) on 07/09/13     Collection Information     Collected: 07/09/2013 11:09 AM   Resulting Agency: [facility]     2D ECHOCARDIOGRAM WITHOUT CONTRAST (Order [id number]) on 07/09/13

## 2015-07-22 ENCOUNTER — Encounter
Admission: RE | Disposition: A | Discharge status: Home / Self Care | Payer: Self-pay | Source: Ambulatory Visit | Attending: General Surgery

## 2015-07-22 ENCOUNTER — Ambulatory Visit
Admission: RE | Admit: 2015-07-22 | Discharge: 2015-07-22 | Disposition: A | Discharge status: Home / Self Care | Payer: BLUE CROSS/BLUE SHIELD | Source: Ambulatory Visit | Attending: General Surgery | Admitting: General Surgery

## 2015-07-22 ENCOUNTER — Ambulatory Visit: Payer: BLUE CROSS/BLUE SHIELD | Admitting: Anesthesiology

## 2015-07-22 ENCOUNTER — Encounter: Payer: Self-pay | Admitting: *Deleted

## 2015-07-22 DIAGNOSIS — D176 Benign lipomatous neoplasm of spermatic cord: Secondary | ICD-10-CM | POA: Diagnosis not present

## 2015-07-22 DIAGNOSIS — Z87442 Personal history of urinary calculi: Secondary | ICD-10-CM | POA: Insufficient documentation

## 2015-07-22 DIAGNOSIS — Z87891 Personal history of nicotine dependence: Secondary | ICD-10-CM | POA: Diagnosis not present

## 2015-07-22 DIAGNOSIS — Z1211 Encounter for screening for malignant neoplasm of colon: Secondary | ICD-10-CM | POA: Insufficient documentation

## 2015-07-22 DIAGNOSIS — Z8249 Family history of ischemic heart disease and other diseases of the circulatory system: Secondary | ICD-10-CM | POA: Diagnosis not present

## 2015-07-22 DIAGNOSIS — I341 Nonrheumatic mitral (valve) prolapse: Secondary | ICD-10-CM | POA: Insufficient documentation

## 2015-07-22 DIAGNOSIS — Z888 Allergy status to other drugs, medicaments and biological substances status: Secondary | ICD-10-CM | POA: Diagnosis not present

## 2015-07-22 DIAGNOSIS — Z79899 Other long term (current) drug therapy: Secondary | ICD-10-CM | POA: Insufficient documentation

## 2015-07-22 DIAGNOSIS — I481 Persistent atrial fibrillation: Secondary | ICD-10-CM | POA: Insufficient documentation

## 2015-07-22 DIAGNOSIS — K4090 Unilateral inguinal hernia, without obstruction or gangrene, not specified as recurrent: Secondary | ICD-10-CM

## 2015-07-22 DIAGNOSIS — D123 Benign neoplasm of transverse colon: Secondary | ICD-10-CM

## 2015-07-22 DIAGNOSIS — E039 Hypothyroidism, unspecified: Secondary | ICD-10-CM | POA: Diagnosis not present

## 2015-07-22 HISTORY — PX: INGUINAL HERNIA REPAIR: SHX194

## 2015-07-22 HISTORY — DX: Cardiac arrhythmia, unspecified: I49.9

## 2015-07-22 HISTORY — DX: Headache, unspecified: R51.9

## 2015-07-22 HISTORY — PX: COLONOSCOPY WITH PROPOFOL: SHX5780

## 2015-07-22 HISTORY — DX: Headache: R51

## 2015-07-22 SURGERY — COLONOSCOPY WITH PROPOFOL
Anesthesia: General

## 2015-07-22 SURGERY — REPAIR, HERNIA, INGUINAL, ADULT
Anesthesia: General | Laterality: Left | Wound class: Clean Contaminated

## 2015-07-22 MED ORDER — EPHEDRINE SULFATE 50 MG/ML IJ SOLN
INTRAMUSCULAR | Status: DC | PRN
  Administered 2015-07-22 (×2): 5 mg via INTRAVENOUS
  Administered 2015-07-22: 10 mg via INTRAVENOUS

## 2015-07-22 MED ORDER — PROPOFOL 500 MG/50ML IV EMUL
INTRAVENOUS | Status: DC | PRN
  Administered 2015-07-22: 125 ug/kg/min via INTRAVENOUS

## 2015-07-22 MED ORDER — PROPOFOL 10 MG/ML IV BOLUS
INTRAVENOUS | Status: DC | PRN
  Administered 2015-07-22 (×2): 50 mg via INTRAVENOUS

## 2015-07-22 MED ORDER — CEFAZOLIN SODIUM-DEXTROSE 2-4 GM/100ML-% IV SOLN: 2.0000 g | INTRAVENOUS | Status: DC

## 2015-07-22 MED ORDER — DEXAMETHASONE SODIUM PHOSPHATE 10 MG/ML IJ SOLN
INTRAMUSCULAR | Status: DC | PRN
  Administered 2015-07-22: 5 mg via INTRAVENOUS

## 2015-07-22 MED ORDER — OXYCODONE HCL 5 MG PO TABS: 5.0000 mg | ORAL_TABLET | Freq: Once | ORAL | Status: DC | PRN

## 2015-07-22 MED ORDER — FENTANYL CITRATE (PF) 100 MCG/2ML IJ SOLN: 25.0000 ug | INTRAMUSCULAR | Status: DC | PRN

## 2015-07-22 MED ORDER — LACTATED RINGERS IV SOLN
INTRAVENOUS | Status: DC
  Administered 2015-07-22: 10:00:00 via INTRAVENOUS

## 2015-07-22 MED ORDER — HYDROCODONE-ACETAMINOPHEN 5-325 MG PO TABS: 1.0000 | ORAL_TABLET | ORAL | Status: DC | PRN

## 2015-07-22 MED ORDER — ACETAMINOPHEN 10 MG/ML IV SOLN
INTRAVENOUS | Status: AC
  Filled 2015-07-22: qty 100

## 2015-07-22 MED ORDER — LIDOCAINE HCL (PF) 1 % IJ SOLN
INTRAMUSCULAR | Status: DC | PRN
  Administered 2015-07-22: 50 mg

## 2015-07-22 MED ORDER — FAMOTIDINE 20 MG PO TABS
20.0000 mg | ORAL_TABLET | Freq: Once | ORAL | Status: AC
  Administered 2015-07-22: 20 mg via ORAL

## 2015-07-22 MED ORDER — ONDANSETRON HCL 4 MG/2ML IJ SOLN
INTRAMUSCULAR | Status: DC | PRN
  Administered 2015-07-22: 4 mg via INTRAVENOUS

## 2015-07-22 MED ORDER — OXYCODONE HCL 5 MG/5ML PO SOLN: 5.0000 mg | Freq: Once | ORAL | Status: DC | PRN

## 2015-07-22 MED ORDER — BUPIVACAINE-EPINEPHRINE (PF) 0.5% -1:200000 IJ SOLN
INTRAMUSCULAR | Status: AC
  Filled 2015-07-22: qty 30

## 2015-07-22 MED ORDER — GLYCOPYRROLATE 0.2 MG/ML IJ SOLN
INTRAMUSCULAR | Status: DC | PRN
  Administered 2015-07-22: 0.2 mg via INTRAVENOUS

## 2015-07-22 MED ORDER — BUPIVACAINE-EPINEPHRINE (PF) 0.5% -1:200000 IJ SOLN
INTRAMUSCULAR | Status: DC | PRN
  Administered 2015-07-22: 30 mL via PERINEURAL

## 2015-07-22 MED ORDER — ACETAMINOPHEN 10 MG/ML IV SOLN
INTRAVENOUS | Status: DC | PRN
  Administered 2015-07-22: 1000 mg via INTRAVENOUS

## 2015-07-22 MED ORDER — CEFAZOLIN SODIUM-DEXTROSE 2-4 GM/100ML-% IV SOLN
INTRAVENOUS | Status: AC
  Administered 2015-07-22: 2 g via INTRAVENOUS
  Filled 2015-07-22: qty 100

## 2015-07-22 MED ORDER — LACTATED RINGERS IV SOLN
INTRAVENOUS | Status: DC | PRN
  Administered 2015-07-22: 11:00:00 via INTRAVENOUS

## 2015-07-22 MED ORDER — FAMOTIDINE 20 MG PO TABS
ORAL_TABLET | ORAL | Status: AC
  Administered 2015-07-22: 20 mg via ORAL
  Filled 2015-07-22: qty 1

## 2015-07-22 SURGICAL SUPPLY — 34 items
BLADE SURG 15 STRL SS SAFETY (BLADE) ×6 IMPLANT
CANISTER SUCT 1200ML W/VALVE (MISCELLANEOUS) ×3 IMPLANT
CHLORAPREP W/TINT 26ML (MISCELLANEOUS) ×3 IMPLANT
CLOSURE WOUND 1/2 X4 (GAUZE/BANDAGES/DRESSINGS) ×1
DECANTER SPIKE VIAL GLASS SM (MISCELLANEOUS) ×3 IMPLANT
DRAIN PENROSE 1/4X12 LTX (DRAIN) ×3 IMPLANT
DRAPE LAPAROTOMY 100X77 ABD (DRAPES) ×3 IMPLANT
DRESSING TELFA 4X3 1S ST N-ADH (GAUZE/BANDAGES/DRESSINGS) ×3 IMPLANT
DRSG TEGADERM 4X4.75 (GAUZE/BANDAGES/DRESSINGS) ×3 IMPLANT
ELECT REM PT RETURN 9FT ADLT (ELECTROSURGICAL) ×3
ELECTRODE REM PT RTRN 9FT ADLT (ELECTROSURGICAL) ×1 IMPLANT
GLOVE BIO SURGEON STRL SZ7.5 (GLOVE) ×21 IMPLANT
GLOVE INDICATOR 8.0 STRL GRN (GLOVE) ×3 IMPLANT
GOWN STRL REUS W/ TWL LRG LVL3 (GOWN DISPOSABLE) ×4 IMPLANT
GOWN STRL REUS W/TWL LRG LVL3 (GOWN DISPOSABLE) ×8
KIT RM TURNOVER STRD PROC AR (KITS) ×3 IMPLANT
LABEL OR SOLS (LABEL) ×3 IMPLANT
MESH HERNIA 6X12 ULTRAPRO MED (Mesh General) ×1 IMPLANT
MESH HERNIA ULTRAPRO MED (Mesh General) ×2 IMPLANT
NDL SAFETY 22GX1.5 (NEEDLE) ×6 IMPLANT
NEEDLE HYPO 25X1 1.5 SAFETY (NEEDLE) ×3 IMPLANT
PACK BASIN MINOR ARMC (MISCELLANEOUS) ×3 IMPLANT
STRIP CLOSURE SKIN 1/2X4 (GAUZE/BANDAGES/DRESSINGS) ×2 IMPLANT
SUT SURGILON 0 BLK (SUTURE) ×6 IMPLANT
SUT VIC AB 2-0 SH 27 (SUTURE) ×2
SUT VIC AB 2-0 SH 27XBRD (SUTURE) ×1 IMPLANT
SUT VIC AB 3-0 54X BRD REEL (SUTURE) ×1 IMPLANT
SUT VIC AB 3-0 BRD 54 (SUTURE) ×2
SUT VIC AB 3-0 SH 27 (SUTURE) ×2
SUT VIC AB 3-0 SH 27X BRD (SUTURE) ×1 IMPLANT
SUT VIC AB 4-0 FS2 27 (SUTURE) ×3 IMPLANT
SWABSTK COMLB BENZOIN TINCTURE (MISCELLANEOUS) ×3 IMPLANT
SYR 3ML LL SCALE MARK (SYRINGE) ×3 IMPLANT
SYR CONTROL 10ML (SYRINGE) ×6 IMPLANT

## 2015-07-22 NOTE — Op Note (Signed)
Preoperative diagnosis: Left inguinal hernia.  Postoperative diagnosis: Same.  Operative procedure: Left inguinal hernia repair with excision of lipoma of the cord, placement of medium Ultra Pro mesh.  Operating surgeon: Ollen Bowl, M.D.  Anesthesia: Gen. by LMA, Marcaine 0.5% with 1-200,000 epinephrine, Toradol 30 mg.  Has no blood loss: 5 mL.  Clinical note: This 65 year old male underwent screening colonoscopy this morning with identification of a single small 5 mm polyp in the transverse colon. He is brought the operative today for recently identified symptomatic left inguinal hernia. He received Kefzol prior to the procedure. Hair was removed from the surgical site with clippers prior to presentation to the operating theater.  Operative note: With the patient under adequate general anesthesia the area of prepped with ChloraPrep and draped. A 5 cm skin line incision was made after instillation of local anesthetic as a field block. The skin was incised sharply the remaining dissection completed with electrocautery. Hemostasis was electrocautery and 3-0 Vicryl ties. The external oblique was opened in the direction of its fibers. A sizable lipoma of the cord was excised and discarded. There was noted to be inflammation of the adipose tissue at the base the lipoma. A moderate-sized indirect sac was dissected free of adjacent structures and returned to the preperitoneal space. The preperitoneal space was cleared and a medium Ultra Pro mesh within position. The external component was anchored to the pubic tubercle and then the inferior edge to the inguinal ligament with interrupted 0 Surgilon sutures. The medial and superior edges were anchored to the transverse abdominis aponeurosis with interrupted sutures of 0 Surgilon. A lateral slit was made for cord passage and closed. The external oblique was closed after instillation of Toradol of the wound. The external oblique was closed with a running  2-0 Vicryl suture. The layer of Scarpa's fascia was closed with a running 2-0 Vicryl suture. The skin was closed with a running 4-0 Vicryl subcuticular suture. Benzoin, Steri-Strips, Telfa and Tegaderm dressing was applied.  The patient tolerated the procedure well and was taken to recovery in stable condition.

## 2015-07-22 NOTE — Op Note (Signed)
[facility] Patient Name: Bobby Flowers Procedure Date: 07/22/2015 10:36 AM MRN: [id number] Account #: [id number] Date of Birth: [birth year] Admit Type: Outpatient Age: 65 Room: [facility] ENDO ROOM 1 Gender: Male Note Status: Finalized Procedure:            Colonoscopy Indications:          Screening for colorectal malignant neoplasm Providers:            Robert Bellow, MD Referring MD:         No Local Md, MD (Referring MD) Medicines:            Monitored Anesthesia Care Complications:        No immediate complications. Procedure:            Pre-Anesthesia Assessment:                       - Prior to the procedure, a History and Physical was                        performed, and patient medications, allergies and                        sensitivities were reviewed. The patient's tolerance of                        previous anesthesia was reviewed.                       - The risks and benefits of the procedure and the                        sedation options and risks were discussed with the                        patient. All questions were answered and informed                        consent was obtained.                       After obtaining informed consent, the colonoscope was                        passed under direct vision. Throughout the procedure,                        the patient's blood pressure, pulse, and oxygen                        saturations were monitored continuously. The                        Colonoscope was introduced through the anus and                        advanced to the the cecum, identified by the                        appendiceal orifice, IC valve and transillumination.  The colonoscopy was performed without difficulty. The                        patient tolerated the procedure well. The quality of                        the bowel preparation was good. Findings:      A 5 mm polyp was found in  the proximal transverse colon. The polyp was       sessile. Biopsies were taken with a cold forceps for histology.      The retroflexed view of the distal rectum and anal verge was normal and       showed no anal or rectal abnormalities. Impression:           - One 5 mm polyp in the proximal transverse colon.                        Biopsied.                       - The distal rectum and anal verge are normal on                        retroflexion view. Recommendation:       - Telephone endoscopist for pathology results in 1 week. Procedure Code(s):    --- Professional ---                       [phone], Colonoscopy, flexible; with biopsy, single or                        multiple Diagnosis Code(s):    --- Professional ---                       Z12.11, Encounter for screening for malignant neoplasm                        of colon                       D12.3, Benign neoplasm of transverse colon (hepatic                        flexure or splenic flexure) CPT copyright 2016 American [employer] Association. All rights reserved. The codes documented in this report are preliminary and upon coder review may  be revised to meet current compliance requirements. Robert Bellow, MD 07/22/2015 11:17:22 AM This report has been signed electronically. Number of Addenda: 0 Note Initiated On: 07/22/2015 10:36 AM Scope Withdrawal Time: 0 hours 15 minutes 27 seconds  Total Procedure Duration: 0 hours 26 minutes 8 seconds       [facility]

## 2015-07-22 NOTE — H&P (Signed)
No change in clinical exam or history. For screening colonoscopy and LIH repair.

## 2015-07-22 NOTE — Anesthesia Postprocedure Evaluation (Signed)
Anesthesia Post Note  Patient: Bobby Flowers  Procedure(s) Performed: Procedure(s) (LRB): HERNIA REPAIR INGUINAL ADULT (Left)  Patient location during evaluation: PACU Anesthesia Type: General Level of consciousness: awake and alert Pain management: pain level controlled Vital Signs Assessment: post-procedure vital signs reviewed and stable Respiratory status: spontaneous breathing, nonlabored ventilation, respiratory function stable and patient connected to nasal cannula oxygen Cardiovascular status: blood pressure returned to baseline and stable Postop Assessment: no signs of nausea or vomiting Anesthetic complications: no    Last Vitals:  Filed Vitals:   07/22/15 1253 07/22/15 1309  BP: 147/86 151/79  Pulse: 56 51  Temp:  35.7 C  Resp: 14 16    Last Pain:  Filed Vitals:   07/22/15 1312  PainSc: Asleep                 Martha Clan

## 2015-07-22 NOTE — Transfer of Care (Signed)
Immediate Anesthesia Transfer of Care Note  Patient: Bobby Flowers  Procedure(s) Performed: Procedure(s): HERNIA REPAIR INGUINAL ADULT (Left)  Patient Location: PACU  Anesthesia Type:General  Level of Consciousness: sedated  Airway & Oxygen Therapy: Patient Spontanous Breathing  Post-op Assessment: Report given to RN  Post vital signs: stable  Last Vitals:  Filed Vitals:   07/22/15 0945 07/22/15 1222  BP: 141/86 127/89  Pulse: 52 51  Temp: 36.6 C 36.6 C  Resp: 16 16    Last Pain:  Filed Vitals:   07/22/15 1226  PainSc: Asleep         Complications: No apparent anesthesia complications

## 2015-07-22 NOTE — Progress Notes (Signed)
Dr Bary Castilla in to see patient - notified him he can take dressing off on Saturday or leave in place.

## 2015-07-22 NOTE — Discharge Instructions (Signed)

## 2015-07-22 NOTE — Anesthesia Procedure Notes (Signed)
Procedure Name: LMA Insertion Date/Time: 07/22/2015 11:24 AM Performed by: Zetta Bills Pre-anesthesia Checklist: Patient identified, Emergency Drugs available, Suction available, Patient being monitored and Timeout performed Patient Re-evaluated:Patient Re-evaluated prior to inductionOxygen Delivery Method: Circle system utilized Preoxygenation: Pre-oxygenation with 100% oxygen Intubation Type: IV induction Ventilation: Mask ventilation without difficulty LMA: LMA inserted LMA Size: 4.0 Number of attempts: 2 Placement Confirmation: positive ETCO2 Tube secured with: Tape

## 2015-07-22 NOTE — Anesthesia Preprocedure Evaluation (Signed)
Anesthesia Evaluation  Patient identified by MRN, date of birth, ID band Patient awake    Reviewed: Allergy & Precautions, H&P , NPO status , Patient's Chart, lab work & pertinent test results  History of Anesthesia Complications Negative for: history of anesthetic complications  Airway Mallampati: III  TM Distance: <3 FB Neck ROM: limited    Dental  (+) Poor Dentition, Chipped, Caps   Pulmonary neg shortness of breath, former smoker,    Pulmonary exam normal breath sounds clear to auscultation       Cardiovascular Exercise Tolerance: Good (-) angina(-) Past MI and (-) DOE Normal cardiovascular exam+ dysrhythmias Atrial Fibrillation + Valvular Problems/Murmurs MVP  Rhythm:regular Rate:Normal     Neuro/Psych  Headaches, negative psych ROS   GI/Hepatic negative GI ROS, Neg liver ROS,   Endo/Other  Hypothyroidism   Renal/GU Renal disease  negative genitourinary   Musculoskeletal   Abdominal   Peds  Hematology negative hematology ROS (+)   Anesthesia Other Findings Past Medical History:   Mitral valve prolapse                                          Comment:With mild regurgitation   History of kidney stones                                     Hypothyroid                                                  Persistent atrial fibrillation (HCC)                         Thyroid disease                                              Chronic kidney disease                                         Comment:H/O KIDNEY STONE   Headache                                                     Dysrhythmia                                                    Comment:A-FIB  Past Surgical History:   NASAL SINUS SURGERY                                           HEMORROIDECTOMY  COLONOSCOPY                                      04/25/05       VASECTOMY                                                     BMI    Body Mass Index   24.53 kg/m 2      Reproductive/Obstetrics negative OB ROS                             Anesthesia Physical Anesthesia Plan  ASA: III  Anesthesia Plan: General   Post-op Pain Management:    Induction:   Airway Management Planned:   Additional Equipment:   Intra-op Plan:   Post-operative Plan:   Informed Consent: I have reviewed the patients History and Physical, chart, labs and discussed the procedure including the risks, benefits and alternatives for the proposed anesthesia with the patient or authorized representative who has indicated his/her understanding and acceptance.   Dental Advisory Given  Plan Discussed with: Anesthesiologist, CRNA and Surgeon  Anesthesia Plan Comments:         Anesthesia Quick Evaluation

## 2015-07-23 ENCOUNTER — Encounter: Payer: Self-pay | Admitting: General Surgery

## 2015-07-23 LAB — SURGICAL PATHOLOGY

## 2015-07-24 ENCOUNTER — Telehealth: Payer: Self-pay

## 2015-07-24 NOTE — Telephone Encounter (Signed)
Notified patient as instructed, patient pleased. Discussed follow-up appointments, patient agrees. Patient states is doing very well after surgery. Eating and drinking well, no problems with using the bathroom. Patient placed in recalls.

## 2015-07-24 NOTE — Telephone Encounter (Signed)
-----   Message from Robert Bellow, MD sent at 07/24/2015  8:44 AM EDT ----- Please notify patient polyp removed was fine, but he gets a chance to repeat exam in five years. Please place in recalls. See how he is doing post hernia repair. Thanks.  ----- Message -----    From: [facility]    Sent: 07/23/2015   7:14 PM      To: Robert Bellow, MD

## 2015-08-03 ENCOUNTER — Encounter: Payer: Self-pay | Admitting: General Surgery

## 2015-08-05 ENCOUNTER — Encounter: Payer: Self-pay | Admitting: General Surgery

## 2015-08-05 ENCOUNTER — Ambulatory Visit (INDEPENDENT_AMBULATORY_CARE_PROVIDER_SITE_OTHER): Payer: BLUE CROSS/BLUE SHIELD | Admitting: General Surgery

## 2015-08-05 VITALS — BP 130/70 | HR 72 | Resp 12 | Ht 70.0 in | Wt 170.0 lb

## 2015-08-05 DIAGNOSIS — K4090 Unilateral inguinal hernia, without obstruction or gangrene, not specified as recurrent: Secondary | ICD-10-CM

## 2015-08-05 DIAGNOSIS — Z1211 Encounter for screening for malignant neoplasm of colon: Secondary | ICD-10-CM

## 2015-08-05 NOTE — Patient Instructions (Addendum)
Patient to return as needed. Proper lifting techniques reviewed. 

## 2015-08-05 NOTE — Progress Notes (Signed)
Patient ID: Bobby Flowers, male   DOB: 1951-02-18, 65 y.o.   MRN: [id number]  Chief Complaint  Patient presents with  . Routine Post Op    hernia and colonoscopy    HPI Bobby Flowers is a 65 y.o. male here today for his post op left inguinal and colonoscopy done on 07/21/15. Patient states he is doing well, Moving his bowels daily. No difficulties with urination. Normal pain.  I personally reviewed the patient's history. HPI  Past Medical History  Diagnosis Date  . Mitral valve prolapse     With mild regurgitation  . History of kidney stones   . Hypothyroid   . Persistent atrial fibrillation ([place])   . Thyroid disease   . Chronic kidney disease     H/O KIDNEY STONE  . Headache   . Dysrhythmia     A-FIB    Past Surgical History  Procedure Laterality Date  . Nasal sinus surgery    . Hemorroidectomy    . Colonoscopy  04/25/05  . Vasectomy    . Inguinal hernia repair Left 07/22/2015    Procedure: HERNIA REPAIR INGUINAL ADULT;  Surgeon: Robert Bellow, MD;  Location: [facility] ORS;  Service: General;  Laterality: Left;  . Colonoscopy with propofol N/A 07/22/2015    Procedure: COLONOSCOPY WITH PROPOFOL;  Surgeon: Robert Bellow, MD;  Location: [facility] ENDOSCOPY;  Service: Endoscopy;  Laterality: N/A;  . Hernia repair Left May 22, 2015    Left inguinal hernia repair with medium Ultra Pro mesh    Family History  Problem Relation Age of Onset  . Heart failure Mother     Social History Social History  Substance Use Topics  . Smoking status: Former Smoker -- 1.50 packs/day for 31 years    Types: Cigarettes    Quit date: 07/14/2000  . Smokeless tobacco: None  . Alcohol Use: No    Allergies  Allergen Reactions  . Coumadin [Warfarin] Rash    Current Outpatient Prescriptions  Medication Sig Dispense Refill  . amiodarone (PACERONE) 200 MG tablet Take 1 tablet (200 mg total) by mouth daily. 90 tablet 3  . atenolol (TENORMIN) 100 MG tablet Take 1 tablet (100 mg total) by mouth  daily. 90 tablet 3  . levothyroxine (SYNTHROID, LEVOTHROID) 75 MCG tablet Take 75 mcg by mouth daily before breakfast. Reported on 08/05/2015     No current facility-administered medications for this visit.    Review of Systems Review of Systems  Constitutional: Negative.   Respiratory: Negative.   Cardiovascular: Negative.     Blood pressure 130/70, pulse 72, resp. rate 12, height 5\' 10"  (1.778 m), weight 170 lb (77.111 kg).  Physical Exam Physical Exam  Constitutional: He is oriented to person, place, and time. He appears well-developed and well-nourished.  Abdominal:  Left inguinal hernia repair is intact and healing well.  Neurological: He is alert and oriented to person, place, and time.  Skin: Skin is dry.    Data Reviewed DIAGNOSIS:  A. COLON POLYP, PROXIMAL TRANSVERSE; COLD BIOPSY:  - TUBULAR ADENOMA.  - NEGATIVE FOR HIGH-GRADE DYSPLASIA AND MALIGNANCY.  Assessment    Doing well status post inguinal hernia repair.  Adenomatous polyp of the transverse colon.    Plan    Proper lifting techniques reviewed. He has already returned to work.   Patient to return as needed. Put in recalls for 5 years  colonoscopy. PCP:  Kary Kos This information has been scribed by Gaspar Cola CMA.    Hervey Ard  W 08/05/2015, 12:24 PM

## 2015-08-19 ENCOUNTER — Encounter: Payer: Self-pay | Admitting: General Surgery

## 2015-09-18 ENCOUNTER — Encounter: Payer: Self-pay | Admitting: Cardiovascular Disease

## 2015-09-18 ENCOUNTER — Ambulatory Visit (INDEPENDENT_AMBULATORY_CARE_PROVIDER_SITE_OTHER): Payer: BLUE CROSS/BLUE SHIELD | Admitting: Cardiovascular Disease

## 2015-09-18 VITALS — BP 100/70 | HR 59 | Ht 69.0 in | Wt 174.6 lb

## 2015-09-18 DIAGNOSIS — I481 Persistent atrial fibrillation: Secondary | ICD-10-CM | POA: Diagnosis not present

## 2015-09-18 DIAGNOSIS — I4819 Other persistent atrial fibrillation: Secondary | ICD-10-CM

## 2015-09-18 DIAGNOSIS — I341 Nonrheumatic mitral (valve) prolapse: Secondary | ICD-10-CM | POA: Diagnosis not present

## 2015-09-18 DIAGNOSIS — I4891 Unspecified atrial fibrillation: Secondary | ICD-10-CM | POA: Diagnosis not present

## 2015-09-18 MED ORDER — AMIODARONE HCL 100 MG PO TABS: 100.0000 mg | ORAL_TABLET | Freq: Every day | ORAL | 5 refills | Status: DC

## 2015-09-18 NOTE — Patient Instructions (Addendum)
Medication Instructions:  Your physician has recommended you make the following change in your medication:  DECREASE amiodarone to 100mg  once daily   Labwork: none  Testing/Procedures: Your physician has requested that you have an echocardiogram. Echocardiography is a painless test that uses sound waves to create images of your heart. It provides your doctor with information about the size and shape of your heart and how well your heart's chambers and valves are working. This procedure takes approximately one hour. There are no restrictions for this procedure.    Follow-Up: Your physician wants you to follow-up in: six months with Dr. Fletcher Anon.  You will receive a reminder letter in the mail two months in advance. If you don't receive a letter, please call our office to schedule the follow-up appointment.   Any Other Special Instructions Will Be Listed Below (If Applicable).     If you need a refill on your cardiac medications before your next appointment, please call your pharmacy.  Echocardiogram An echocardiogram, or echocardiography, uses sound waves (ultrasound) to produce an image of your heart. The echocardiogram is simple, painless, obtained within a short period of time, and offers valuable information to your health care provider. The images from an echocardiogram can provide information such as:  Evidence of coronary artery disease (CAD).  Heart size.  Heart muscle function.  Heart valve function.  Aneurysm detection.  Evidence of a past heart attack.  Fluid buildup around the heart.  Heart muscle thickening.  Assess heart valve function. LET [facility] CARE PROVIDER KNOW ABOUT:  Any allergies you have.  All medicines you are taking, including vitamins, herbs, eye drops, creams, and over-the-counter medicines.  Previous problems you or members of your family have had with the use of anesthetics.  Any blood disorders you have.  Previous surgeries you have  had.  Medical conditions you have.  Possibility of pregnancy, if this applies. BEFORE THE PROCEDURE  No special preparation is needed. Eat and drink normally.  PROCEDURE   In order to produce an image of your heart, gel will be applied to your chest and a wand-like tool (transducer) will be moved over your chest. The gel will help transmit the sound waves from the transducer. The sound waves will harmlessly bounce off your heart to allow the heart images to be captured in real-time motion. These images will then be recorded.  You may need an IV to receive a medicine that improves the quality of the pictures. AFTER THE PROCEDURE You may return to your normal schedule including diet, activities, and medicines, unless your health care provider tells you otherwise.   This information is not intended to replace advice given to you by your health care provider. Make sure you discuss any questions you have with your health care provider.   Document Released: 02/05/2000 Document Revised: 02/28/2014 Document Reviewed: 10/15/2012 Elsevier Interactive Patient Education [employer].

## 2015-09-18 NOTE — Progress Notes (Signed)
Cardiology Office Note   Date:  09/18/2015   ID:  Bobby Flowers, DOB 06-16-1950, MRN [id number]  PCP:  Bobby Pink, MD  Cardiologist:   [name], MD   Chief Complaint  Patient presents with  . Other    6 month follow up. Meds reviewed by the patient verbally. "doing well."       History of Present Illness: Bobby Flowers is a 65 y.o. male who presents for a followup visit regarding persistent atrial fibrillation and mitral valve prolapse with mild regurgitation. He has prolonged history of atrial fibrillation which was diagnosed in the 86s. There was questionable severe mitral valve prolapse on initial echocardiogram but repeat evaluation at [facility] was unremarkable. He was told that the mitral valve prolapse was mild. There was also questionable intercardiac thrombus but that was excluded by a TEE. He was briefly on warfarin but had an allergic reaction with a rash. He has not been on any other blood thinners since then given his low CHADS2 VASc score.  He quit smoking in 2002 and does not consume alcohol. There is no family history of premature coronary artery disease. He had multiple stress test throughout the years but none recently.  Echocardiogram in may of 2015 showed an ejection fraction of 55-60% with no evidence of left ventricular hypertrophy. There was moderate mitral valve prolapse with only mild regurgitation. There was mild biatrial enlargement. He was seen by Dr. Rayann Heman in 2015 for consultation regarding possible catheter ablation for atrial fibrillation. Given stability on low-dose amiodarone, which was best decided to continue with medical therapy. He had recurrent atrial fibrillation in 2016 but he converted back to sinus rhythm after increasing the dose of amiodarone. He is doing well and denies any recurrent palpitations. No chest pain or shortness of breath. He underwent colonoscopy and hernia surgery in May 0000000 with no complications. I reviewed his labs from April  which showed normal thyroid and liver profile.   Past Medical History:  Diagnosis Date  . Chronic kidney disease    H/O KIDNEY STONE  . Dysrhythmia    A-FIB  . Headache   . History of kidney stones   . Hypothyroid   . Mitral valve prolapse    With mild regurgitation  . Persistent atrial fibrillation ([place])   . Thyroid disease     Past Surgical History:  Procedure Laterality Date  . COLONOSCOPY  04/25/05  . COLONOSCOPY WITH PROPOFOL N/A 07/22/2015   Procedure: COLONOSCOPY WITH PROPOFOL;  Surgeon: Robert Bellow, MD;  Location: [facility] ENDOSCOPY;  Service: Endoscopy;  Laterality: N/A;  . HEMORROIDECTOMY    . HERNIA REPAIR Left May 22, 2015   Left inguinal hernia repair with medium Ultra Pro mesh  . INGUINAL HERNIA REPAIR Left 07/22/2015   Procedure: HERNIA REPAIR INGUINAL ADULT;  Surgeon: Robert Bellow, MD;  Location: [facility] ORS;  Service: General;  Laterality: Left;  . NASAL SINUS SURGERY    . VASECTOMY       Current Outpatient Prescriptions  Medication Sig Dispense Refill  . amiodarone (PACERONE) 200 MG tablet Take 1 tablet (200 mg total) by mouth daily. 90 tablet 3  . atenolol (TENORMIN) 100 MG tablet Take 1 tablet (100 mg total) by mouth daily. 90 tablet 3  . levothyroxine (SYNTHROID, LEVOTHROID) 75 MCG tablet Take 75 mcg by mouth daily before breakfast. Reported on 08/05/2015     No current facility-administered medications for this visit.     Allergies:   Coumadin [warfarin]  Social History:  The patient  reports that he quit smoking about 15 years ago. His smoking use included Cigarettes. He has a 46.50 pack-year smoking history. He has never used smokeless tobacco. He reports that he does not drink alcohol or use drugs.   Family History:  The patient's family history includes Heart failure in his mother.    ROS:  Please see the history of present illness.   Otherwise, review of systems are positive for none.   All other systems are reviewed and negative.     PHYSICAL EXAM: VS:  BP 100/70 (BP Location: Left Arm, Patient Position: Sitting, Cuff Size: Normal)   Pulse (!) 59   Ht 5\' 9"  (1.753 m)   Wt 174 lb 10 oz (79.2 kg)   BMI 25.79 kg/m  , BMI Body mass index is 25.79 kg/m. GEN: Well nourished, well developed, in no acute distress  HEENT: normal  Neck: no JVD, carotid bruits, or masses Cardiac: RRR; no murmurs, rubs, or gallops,no edema  Respiratory:  clear to auscultation bilaterally, normal work of breathing GI: soft, nontender, nondistended, + BS MS: no deformity or atrophy  Skin: warm and dry, no rash Neuro:  Strength and sensation are intact Psych: euthymic mood, full affect   EKG:  EKG is ordered today. The ekg ordered today demonstrates sinus bradycardia with first-degree AV block. Poor R-wave progression in the anterior leads.   Recent Labs: 09/29/2014: BUN 11; Creatinine, Ser 0.97; Potassium 4.5; Sodium 140; TSH 2.750 10/31/2014: Platelets 183    Lipid Panel No results found for: CHOL, TRIG, HDL, CHOLHDL, VLDL, LDLCALC, LDLDIRECT    Wt Readings from Last 3 Encounters:  09/18/15 174 lb 10 oz (79.2 kg)  08/05/15 170 lb (77.1 kg)  07/22/15 171 lb (77.6 kg)         ASSESSMENT AND PLAN:  1.  . Persistent atrial fibrillation: Maintaining in sinus rhythm with amiodarone. I elected to decrease the dose to 100 mg once daily to try to decrease long-term toxicity considering his relatively young age. Continue same dose atenolol. CHADS VASc score is 0. I reviewed his labs from April which showed normal thyroid and liver profile.  2. Mitral valve disease with mitral valve prolapse: I requested a follow-up echocardiogram for evaluation. Last echo was 2 years ago.    Disposition:   FU with me in 6 months  Signed,  [name], MD  09/18/2015 8:36 AM    [place]

## 2015-10-09 ENCOUNTER — Ambulatory Visit (INDEPENDENT_AMBULATORY_CARE_PROVIDER_SITE_OTHER): Payer: BLUE CROSS/BLUE SHIELD

## 2015-10-09 ENCOUNTER — Other Ambulatory Visit: Payer: Self-pay

## 2015-10-09 DIAGNOSIS — I341 Nonrheumatic mitral (valve) prolapse: Secondary | ICD-10-CM | POA: Diagnosis not present

## 2015-10-09 LAB — ECHOCARDIOGRAM COMPLETE
AOASC: 31 cm
CHL CUP DOP CALC LVOT VTI: 27 cm
E decel time: 327 msec
E/e' ratio: 15.65
FS: 38 % (ref 28–44)
IV/PV OW: 0.86
LA diam end sys: 35 mm
LADIAMINDEX: 1.79 cm/m2
LASIZE: 35 mm
LAVOLA4C: 60 mL
LV E/e' medial: 15.65
LV E/e'average: 15.65
LVELAT: 5.33 cm/s
LVOT area: 3.46 cm2
LVOT diameter: 21 mm
LVOT peak grad rest: 5 mmHg
LVOT peak vel: 112 cm/s
LVOTSV: 93 mL
MV Dec: 327
MV pk A vel: 110 m/s
MVPG: 3 mmHg
MVPKEVEL: 83.4 m/s
PW: 11.5 mm — AB (ref 0.6–1.1)
TAPSE: 15.9 mm
TDI e' lateral: 5.33
TDI e' medial: 4.35

## 2015-10-16 ENCOUNTER — Encounter: Payer: Self-pay | Admitting: General Surgery

## 2015-11-14 ENCOUNTER — Other Ambulatory Visit: Payer: Self-pay | Admitting: Cardiovascular Disease

## 2015-11-14 DIAGNOSIS — I4891 Unspecified atrial fibrillation: Secondary | ICD-10-CM

## 2016-02-08 ENCOUNTER — Other Ambulatory Visit: Payer: Self-pay | Admitting: *Deleted

## 2016-02-08 MED ORDER — AMIODARONE HCL 100 MG PO TABS: 100.0000 mg | ORAL_TABLET | Freq: Every day | ORAL | 3 refills | Status: DC

## 2016-02-14 ENCOUNTER — Other Ambulatory Visit: Payer: Self-pay | Admitting: Cardiovascular Disease

## 2016-05-11 ENCOUNTER — Other Ambulatory Visit: Payer: Self-pay | Admitting: Cardiovascular Disease

## 2016-05-16 ENCOUNTER — Ambulatory Visit (INDEPENDENT_AMBULATORY_CARE_PROVIDER_SITE_OTHER): Payer: BLUE CROSS/BLUE SHIELD | Admitting: Cardiovascular Disease

## 2016-05-16 ENCOUNTER — Encounter: Payer: Self-pay | Admitting: Cardiovascular Disease

## 2016-05-16 VITALS — BP 112/78 | HR 61 | Ht 69.0 in

## 2016-05-16 DIAGNOSIS — I341 Nonrheumatic mitral (valve) prolapse: Secondary | ICD-10-CM

## 2016-05-16 DIAGNOSIS — I481 Persistent atrial fibrillation: Secondary | ICD-10-CM | POA: Diagnosis not present

## 2016-05-16 DIAGNOSIS — I4819 Other persistent atrial fibrillation: Secondary | ICD-10-CM

## 2016-05-16 NOTE — Progress Notes (Signed)
Cardiology Office Note   Date:  05/17/2016   ID:  Bobby Flowers, DOB Dec 29, 1950, MRN [id number]  PCP:  [name], MD  Cardiologist:   [name], MD   Chief Complaint  Patient presents with  . other    70mo f/u. Pt states he is doing well. Reviewed meds with pt verbally.      History of Present Illness: Bobby Flowers is a 66 y.o. male who presents for a followup visit regarding persistent atrial fibrillation and mitral valve prolapse with mild regurgitation. He has prolonged history of atrial fibrillation which was diagnosed in the 32s.   He was briefly on warfarin but had an allergic reaction with a rash. He has not been on any other blood thinners since then given his low CHADS2 VASc score.  He quit smoking in 2002 and does not consume alcohol. He was seen by Dr. Rayann Heman in 2015 for consultation regarding possible catheter ablation for atrial fibrillation. Given stability on low-dose amiodarone, which was best decided to continue with medical therapy. He had recurrent atrial fibrillation in 2016 but he converted back to sinus rhythm after increasing the dose of amiodarone.  Most recent echocardiogram in August 2017 showed normal LV systolic function and atrial size and no evidence of pulmonary hypertension. He has been doing very well and denies any chest pain, shortness of breath or palpitations.   Past Medical History:  Diagnosis Date  . Chronic kidney disease    H/O KIDNEY STONE  . Dysrhythmia    A-FIB  . Headache   . History of kidney stones   . Hypothyroid   . Mitral valve prolapse    With mild regurgitation  . Persistent atrial fibrillation ([place])   . Thyroid disease     Past Surgical History:  Procedure Laterality Date  . COLONOSCOPY  04/25/05  . COLONOSCOPY WITH PROPOFOL N/A 07/22/2015   Procedure: COLONOSCOPY WITH PROPOFOL;  Surgeon: Robert Bellow, MD;  Location: [facility] ENDOSCOPY;  Service: Endoscopy;  Laterality: N/A;  . HEMORROIDECTOMY    . HERNIA  REPAIR Left May 22, 2015   Left inguinal hernia repair with medium Ultra Pro mesh  . INGUINAL HERNIA REPAIR Left 07/22/2015   Procedure: HERNIA REPAIR INGUINAL ADULT;  Surgeon: Robert Bellow, MD;  Location: [facility] ORS;  Service: General;  Laterality: Left;  . NASAL SINUS SURGERY    . VASECTOMY       Current Outpatient Prescriptions  Medication Sig Dispense Refill  . amiodarone (PACERONE) 100 MG tablet Take 1 tablet (100 mg total) by mouth daily. 90 tablet 3  . atenolol (TENORMIN) 100 MG tablet TAKE 1 TABLET (100 MG TOTAL) BY MOUTH DAILY. 90 tablet 3  . latanoprost (XALATAN) 0.005 % ophthalmic solution     . levothyroxine (SYNTHROID, LEVOTHROID) 75 MCG tablet Take 75 mcg by mouth daily before breakfast. Reported on 08/05/2015    . SUMAtriptan (IMITREX) 100 MG tablet Take 100 mg by mouth as needed.     No current facility-administered medications for this visit.     Allergies:   Coumadin [warfarin]    Social History:  The patient  reports that he quit smoking about 15 years ago. His smoking use included Cigarettes. He has a 46.50 pack-year smoking history. He has never used smokeless tobacco. He reports that he does not drink alcohol or use drugs.   Family History:  The patient's family history includes Alcoholism in his brother; Heart failure in his mother; Leukemia in his father.  ROS:  Please see the history of present illness.   Otherwise, review of systems are positive for none.   All other systems are reviewed and negative.    PHYSICAL EXAM: VS:  BP 112/78 (BP Location: Left Arm, Patient Position: Sitting, Cuff Size: Normal)   Pulse 61   Ht 5\' 9"  (1.753 m)  , BMI There is no height or weight on file to calculate BMI. GEN: Well nourished, well developed, in no acute distress  HEENT: normal  Neck: no JVD, carotid bruits, or masses Cardiac: RRR; no murmurs, rubs, or gallops,no edema  Respiratory:  clear to auscultation bilaterally, normal work of breathing GI: soft,  nontender, nondistended, + BS MS: no deformity or atrophy  Skin: warm and dry, no rash Neuro:  Strength and sensation are intact Psych: euthymic mood, full affect   EKG:  EKG is ordered today. The ekg ordered today demonstrates normal sinus rhythm with first degree AV block and old anterior infarct.  Recent Labs: No results found for requested labs within last 8760 hours.    Lipid Panel No results found for: CHOL, TRIG, HDL, CHOLHDL, VLDL, LDLCALC, LDLDIRECT    Wt Readings from Last 3 Encounters:  09/18/15 174 lb 10 oz (79.2 kg)  08/05/15 170 lb (77.1 kg)  07/22/15 171 lb (77.6 kg)         ASSESSMENT AND PLAN:  1.  Persistent atrial fibrillation: Maintaining in sinus rhythm with small dose amiodarone.  CHADS VASc score is 0. He is going for a physical in few months. He will require checking liver profile and TSH given that he is on amiodarone.  2. Mitral valve disease with mitral valve prolapse: Most recent echocardiogram showed no significant regurgitation.   Disposition:   FU with me in 6 months  Signed,  [name], MD  05/17/2016 1:40 PM    [place]

## 2016-05-16 NOTE — Patient Instructions (Signed)
Medication Instructions: No changes.   Labwork: None.   Procedures/Testing: None.   Follow-Up: 6 months with Dr. Dewanna Hurston.   Any Additional Special Instructions Will Be Listed Below (If Applicable).     If you need a refill on your cardiac medications before your next appointment, please call your pharmacy.   

## 2016-08-17 DIAGNOSIS — I4819 Other persistent atrial fibrillation: Secondary | ICD-10-CM | POA: Insufficient documentation

## 2016-08-17 DIAGNOSIS — I341 Nonrheumatic mitral (valve) prolapse: Secondary | ICD-10-CM | POA: Insufficient documentation

## 2016-12-14 DIAGNOSIS — H2513 Age-related nuclear cataract, bilateral: Secondary | ICD-10-CM | POA: Insufficient documentation

## 2016-12-14 DIAGNOSIS — H401132 Primary open-angle glaucoma, bilateral, moderate stage: Secondary | ICD-10-CM | POA: Insufficient documentation

## 2017-01-06 ENCOUNTER — Encounter: Payer: Self-pay | Admitting: Nurse Practitioner

## 2017-01-06 ENCOUNTER — Ambulatory Visit (INDEPENDENT_AMBULATORY_CARE_PROVIDER_SITE_OTHER): Payer: BLUE CROSS/BLUE SHIELD | Admitting: Nurse Practitioner

## 2017-01-06 VITALS — BP 96/60 | HR 62 | Ht 69.0 in | Wt 174.0 lb

## 2017-01-06 DIAGNOSIS — I480 Paroxysmal atrial fibrillation: Secondary | ICD-10-CM

## 2017-01-06 NOTE — Patient Instructions (Signed)
Medication Instructions:  Your physician recommends that you continue on your current medications as directed. Please refer to the Current Medication list given to you today.   Labwork: none  Testing/Procedures: None.  Follow-Up: Your physician wants you to follow-up in: 6 months with Dr. Fletcher Anon.  You will receive a reminder letter in the mail two months in advance. If you don't receive a letter, please call our office to schedule the follow-up appointment.   Any Other Special Instructions Will Be Listed Below (If Applicable).     If you need a refill on your cardiac medications before your next appointment, please call your pharmacy.

## 2017-01-06 NOTE — Progress Notes (Signed)
Office Visit    Patient Name: Bobby Flowers Date of Encounter: 01/06/2017  Primary Care Bobby Flowers:  [name], MD Primary Cardiologist:  Jerilynn Mages. Fletcher Anon, MD   Chief Complaint    66 y/o ? with a history of paroxysmal atrial fibrillation, mitral valve prolapse, and hypothyroidism, who presents for six-month follow-up.  Past Medical History    Past Medical History:  Diagnosis Date  . Headache   . Hypothyroidism   . Mitral valve prolapse    a. 09/2015 Echo: EF 55-60%, no rwma, nl LA size, nl RV fxn, nl PASP.  Bobby Flowers   . PAF (paroxysmal atrial fibrillation) ([place])    a. Dx in 1980's; b. CHA2DS2VASc = 1 - never on [place]; c. Chronic Amio 100mg  QD.   Past Surgical History:  Procedure Laterality Date  . COLONOSCOPY  04/25/05  . COLONOSCOPY WITH PROPOFOL N/A 07/22/2015   Performed by Bobby Bellow, MD at [facility]  . HEMORROIDECTOMY    . HERNIA REPAIR Left May 22, 2015   Left inguinal hernia repair with medium Ultra Pro mesh  . HERNIA REPAIR INGUINAL ADULT Left 07/22/2015   Performed by Bobby Bellow, MD at [facility] ORS  . NASAL SINUS SURGERY    . VASECTOMY      Allergies  Allergies  Allergen Reactions  . Coumadin [Warfarin] Rash    History of Present Illness    66 y/o ? with a prior history of paroxysmal atrial fibrillation dating back to the 21s.  At one point he was on warfarin but had an allergic reaction with a rash.  His CHA2DS2VASc is 1 now that he is 66.  He is not currently anticoagulated.  He is on chronic amiodarone therapy and though he had recurrent atrial fibrillation in 2016, he has not had any since.  He was evaluated in A. fib clinic at 1 point in 2015, and it was felt that given stability on low-dose amiodarone, continue medical therapy was appropriate.  He was last seen in clinic 6 months ago and since then has continued to do well.  He denies any recurrence of A. fib.  He is active and denies chest pain, dyspnea, palpitations, PND,  orthopnea, dizziness, syncope, edema, or early satiety.  Home Medications    Prior to Admission medications   Medication Sig Start Date End Date Taking? Authorizing Bobby Flowers  amiodarone (PACERONE) 100 MG tablet Take 1 tablet (100 mg total) by mouth daily. 02/08/16  Yes Bobby Hampshire, MD  atenolol (TENORMIN) 100 MG tablet TAKE 1 TABLET (100 MG TOTAL) BY MOUTH DAILY. 05/11/16  Yes Bobby Hampshire, MD  latanoprost (XALATAN) 0.005 % ophthalmic solution  04/06/16  Yes Bobby Flowers, Historical, MD  levothyroxine (SYNTHROID, LEVOTHROID) 75 MCG tablet Take 75 mcg by mouth daily before breakfast. Reported on 08/05/2015 05/05/13  Yes Bobby Flowers, Historical, MD  SUMAtriptan (IMITREX) 100 MG tablet Take 100 mg by mouth as needed. 01/18/16  Yes Bobby Flowers, Historical, MD    Review of Systems    He denies chest pain, palpitations, dyspnea, pnd, orthopnea, n, v, dizziness, syncope, edema, weight gain, or early satiety. .  All other systems reviewed and are otherwise negative except as noted above.  Physical Exam    VS:  BP 96/60 (BP Location: Left Arm, Patient Position: Sitting, Cuff Size: Normal)   Pulse 62   Ht 5\' 9"  (1.753 m)   Wt 174 lb (78.9 kg)   BMI 25.70 kg/m  , BMI Body mass index is 25.7  kg/m. GEN: Well nourished, well developed, in no acute distress.  HEENT: normal.  Neck: Supple, no JVD, carotid bruits, or masses. Cardiac: RRR, no murmurs, rubs, or gallops. No clubbing, cyanosis, edema.  Radials/DP/PT 2+ and equal bilaterally.  Respiratory:  Respirations regular and unlabored, clear to auscultation bilaterally. GI: Soft, nontender, nondistended, BS + x 4. MS: no deformity or atrophy. Skin: warm and dry, no rash. Neuro:  Strength and sensation are intact. Psych: Normal affect.  Accessory Clinical Findings    ECG -regular sinus rhythm, 62, first-degree AV block, right axis deviation, probable left atrial enlargement, ? prior anterolateral infarct.  No acute ST or T changes.  Assessment  & Plan    1.  Paroxysmal atrial fibrillation: Patient has been doing well over the past 6 months without recurrence of A. fib.  He remains on low-dose amiodarone.  He had a TSH checked yesterday which was normal.  LFTs were normal over this past summer.  He has annual eye exams in the setting of glaucoma.  Continue low-dose amiodarone and beta-blocker.  CHA2DS2VASc equals 1 in the setting of age of 35.  No oral anticoagulation at this time.  2.  History of mitral valve prolapse: Echocardiogram in 2017 showed normal LV function with no evidence of mitral regurgitation or stenosis.  No mention of mitral valve prolapse.  3.  Disposition: Follow-up with Dr. Fletcher Anon in 6 months or sooner if necessary.  Bobby Hodgkins, NP 01/06/2017, 2:38 PM

## 2017-02-19 ENCOUNTER — Other Ambulatory Visit: Payer: Self-pay | Admitting: Cardiovascular Disease

## 2017-05-14 ENCOUNTER — Other Ambulatory Visit: Payer: Self-pay | Admitting: Cardiovascular Disease

## 2017-05-23 ENCOUNTER — Ambulatory Visit
Admission: RE | Admit: 2017-05-23 | Discharge: 2017-05-23 | Disposition: A | Discharge status: Home / Self Care | Payer: BLUE CROSS/BLUE SHIELD | Source: Ambulatory Visit | Attending: Cardiovascular Disease | Admitting: Cardiovascular Disease

## 2017-05-23 ENCOUNTER — Encounter: Payer: Self-pay | Admitting: Cardiovascular Disease

## 2017-05-23 ENCOUNTER — Other Ambulatory Visit
Admission: RE | Admit: 2017-05-23 | Discharge: 2017-05-23 | Disposition: A | Discharge status: Home / Self Care | Payer: BLUE CROSS/BLUE SHIELD | Source: Ambulatory Visit | Attending: Cardiovascular Disease | Admitting: Cardiovascular Disease

## 2017-05-23 ENCOUNTER — Ambulatory Visit (INDEPENDENT_AMBULATORY_CARE_PROVIDER_SITE_OTHER): Payer: BLUE CROSS/BLUE SHIELD | Admitting: Cardiovascular Disease

## 2017-05-23 VITALS — BP 120/80 | HR 52 | Ht 69.0 in | Wt 181.5 lb

## 2017-05-23 DIAGNOSIS — I200 Unstable angina: Secondary | ICD-10-CM | POA: Diagnosis not present

## 2017-05-23 DIAGNOSIS — I341 Nonrheumatic mitral (valve) prolapse: Secondary | ICD-10-CM

## 2017-05-23 DIAGNOSIS — I481 Persistent atrial fibrillation: Secondary | ICD-10-CM

## 2017-05-23 DIAGNOSIS — Z01818 Encounter for other preprocedural examination: Secondary | ICD-10-CM

## 2017-05-23 DIAGNOSIS — Z01812 Encounter for preprocedural laboratory examination: Secondary | ICD-10-CM

## 2017-05-23 DIAGNOSIS — I4819 Other persistent atrial fibrillation: Secondary | ICD-10-CM

## 2017-05-23 LAB — BASIC METABOLIC PANEL
ANION GAP: 6 (ref 5–15)
BUN: 21 mg/dL — ABNORMAL HIGH (ref 6–20)
CHLORIDE: 105 mmol/L (ref 101–111)
CO2: 31 mmol/L (ref 22–32)
Calcium: 9.3 mg/dL (ref 8.9–10.3)
Creatinine, Ser: 0.95 mg/dL (ref 0.61–1.24)
GFR calc non Af Amer: >60 mL/min (ref >60)
Glucose, Bld: 93 mg/dL (ref 65–99)
Potassium: 4.5 mmol/L (ref 3.5–5.1)
Sodium: 142 mmol/L (ref 135–145)

## 2017-05-23 LAB — CBC
HCT: 46.7 % (ref 40.0–52.0)
HEMOGLOBIN: 15.3 g/dL (ref 13.0–18.0)
MCH: 29.3 pg (ref 26.0–34.0)
MCHC: 32.8 g/dL (ref 32.0–36.0)
MCV: 89.4 fL (ref 80.0–100.0)
Platelets: 175 10*3/uL (ref 150–440)
RBC: 5.23 MIL/uL (ref 4.40–5.90)
RDW: 14.3 % (ref 11.5–14.5)
WBC: 7.8 10*3/uL (ref 3.8–10.6)

## 2017-05-23 MED ORDER — ASPIRIN EC 81 MG PO TBEC: 81.0000 mg | DELAYED_RELEASE_TABLET | Freq: Every day | ORAL | 3 refills | Status: DC

## 2017-05-23 NOTE — H&P (View-Only) (Signed)
Cardiology Office Note   Date:  05/23/2017   ID:  Bobby Flowers, DOB 1950/07/22, MRN [id number]  PCP:  [name], MD  Cardiologist:   [name], MD   Chief Complaint  Patient presents with  . other    Pt. c/o chest heaviness/indigestion and shortness of breath with little exertion; symptoms come and go. Meds reviewed by the pt. verbally.       History of Present Illness: Bobby Flowers is a 67 y.o. male who presents for evaluation of new symptoms of chest tightness and shortness of breath.  He is followed by [place] for  persistent atrial fibrillation and mitral valve prolapse with mild regurgitation. He has prolonged history of atrial fibrillation which was diagnosed in the 76s.   He was briefly on warfarin but had an allergic reaction with a rash. He has not been on any other blood thinners since then given his low CHADS2 VASc score.  He quit smoking in 2002 and does not consume alcohol. He was seen by Dr. Rayann Heman in 2015 for consultation regarding possible catheter ablation for atrial fibrillation. Given stability on low-dose amiodarone, which was best decided to continue with medical therapy. He had recurrent atrial fibrillation in 2016 but he converted back to sinus rhythm after increasing the dose of amiodarone.  Most recent echocardiogram in August 2017 showed normal LV systolic function and atrial size and no evidence of pulmonary hypertension.  Over the last 6 weeks, he has experienced progressive substernal exertional tightness with associated shortness of breath.  This has been progressive and happening with minimal exertion.  He had some symptoms at rest today.  He feels that he has no energy.  Past Medical History:  Diagnosis Date  . Headache   . Hypothyroidism   . Mitral valve prolapse    a. 09/2015 Echo: EF 55-60%, no rwma, nl LA size, nl RV fxn, nl PASP.  [name]   . PAF (paroxysmal atrial fibrillation) ([place])    a. Dx in 1980's; b. CHA2DS2VASc = 1 -  never on [place]; c. Chronic Amio 100mg  QD.    Past Surgical History:  Procedure Laterality Date  . COLONOSCOPY  04/25/05  . COLONOSCOPY WITH PROPOFOL N/A 07/22/2015   Procedure: COLONOSCOPY WITH PROPOFOL;  Surgeon: Robert Bellow, MD;  Location: [facility] ENDOSCOPY;  Service: Endoscopy;  Laterality: N/A;  . HEMORROIDECTOMY    . HERNIA REPAIR Left May 22, 2015   Left inguinal hernia repair with medium Ultra Pro mesh  . INGUINAL HERNIA REPAIR Left 07/22/2015   Procedure: HERNIA REPAIR INGUINAL ADULT;  Surgeon: Robert Bellow, MD;  Location: [facility] ORS;  Service: General;  Laterality: Left;  . NASAL SINUS SURGERY    . VASECTOMY       Current Outpatient Medications  Medication Sig Dispense Refill  . amiodarone (PACERONE) 100 MG tablet TAKE 1 TABLET (100 MG TOTAL) BY MOUTH DAILY. 90 tablet 3  . atenolol (TENORMIN) 100 MG tablet TAKE 1 TABLET (100 MG TOTAL) BY MOUTH DAILY. 90 tablet 3  . latanoprost (XALATAN) 0.005 % ophthalmic solution     . levothyroxine (SYNTHROID, LEVOTHROID) 75 MCG tablet Take 75 mcg by mouth daily before breakfast. Reported on 08/05/2015    . SUMAtriptan (IMITREX) 100 MG tablet Take 100 mg by mouth as needed.     No current facility-administered medications for this visit.     Allergies:   Coumadin [warfarin]    Social History:  The patient  reports that he quit  smoking about 16 years ago. His smoking use included cigarettes. He has a 46.50 pack-year smoking history. He has never used smokeless tobacco. He reports that he does not drink alcohol or use drugs.   Family History:  The patient's family history includes Alcoholism in his brother; Heart failure in his mother; Leukemia in his father.    ROS:  Please see the history of present illness.   Otherwise, review of systems are positive for none.   All other systems are reviewed and negative.    PHYSICAL EXAM: VS:  BP 120/80 (BP Location: Left Arm, Patient Position: Sitting, Cuff Size: Normal)   Pulse (!) 52   Ht  5\' 9"  (1.753 m)   Wt 181 lb 8 oz (82.3 kg)   BMI 26.80 kg/m  , BMI Body mass index is 26.8 kg/m. GEN: Well nourished, well developed, in no acute distress  HEENT: normal  Neck: no JVD, carotid bruits, or masses Cardiac: RRR; rubs, or gallops,no edema .  1 out of 6 systolic murmur at the base. Respiratory:  clear to auscultation bilaterally, normal work of breathing GI: soft, nontender, nondistended, + BS MS: no deformity or atrophy  Skin: warm and dry, no rash Neuro:  Strength and sensation are intact Psych: euthymic mood, full affect   EKG:  EKG is ordered today. The ekg ordered today demonstrates sinus bradycardia with possible left atrial enlargement.  Old anterolateral infarct.  This pattern was present on previous EKG.  Recent Labs: No results found for requested labs within last 8760 hours.    Lipid Panel No results found for: CHOL, TRIG, HDL, CHOLHDL, VLDL, LDLCALC, LDLDIRECT    Wt Readings from Last 3 Encounters:  05/23/17 181 lb 8 oz (82.3 kg)  01/06/17 174 lb (78.9 kg)  09/18/15 174 lb 10 oz (79.2 kg)         ASSESSMENT AND PLAN:  1.  Unstable angina: The patient's symptoms are worrisome for unstable angina with progressive symptoms.  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  However, this pattern was present on his old EKGs.  Given his symptoms, I have recommended proceeding with cardiac catheterization and possible PCI.  I discussed the procedure in details as well as risks and benefits. I added aspirin 81 mg once daily.  2.  Persistent atrial fibrillation: Maintaining in sinus rhythm with small dose amiodarone.  CHADS VASc score is 1.  Normal liver profile and TSH on amiodarone.  3.  Mitral valve disease with mitral valve prolapse: Most recent echocardiogram showed no significant regurgitation.  4.  The patient lipid profile did not show significant hyperlipidemia.  However, if cardiac cath confirms obstructive coronary artery disease, he will be  started on a statin regardless.   Disposition:   FU with me in 1 months  Signed,  [name], MD  05/23/2017 3:36 PM    [name]

## 2017-05-23 NOTE — Patient Instructions (Signed)
Medication Instructions:  Your physician has recommended you make the following change in your medication: START taking aspirin 81mg  once daily.   Labwork: BMET, CBC at the [facility]  Testing/Procedures: A chest x-ray takes a picture of the organs and structures inside the chest, including the heart, lungs, and blood vessels. This test can show several things, including, whether the heart is enlarges; whether fluid is building up in the lungs; and whether pacemaker / defibrillator leads are still in place.   Your physician has requested that you have a cardiac catheterization. Cardiac catheterization is used to diagnose and/or treat various heart conditions. Doctors may recommend this procedure for a number of different reasons. The most common reason is to evaluate chest pain. Chest pain can be a symptom of coronary artery disease (CAD), and cardiac catheterization can show whether plaque is narrowing or blocking your heart's arteries. This procedure is also used to evaluate the valves, as well as measure the blood flow and oxygen levels in different parts of your heart. For further information please visit [facility]. Please follow instruction sheet, as given.    [facility] [address], [place] [address] Dept: [phone] Loc: [place]  05/23/2017  You are scheduled for a Cardiac Catheterization on Monday, April 8 with Dr. [name].  1. Please arrive at the [facility] Registration desk at  9:30 AM (one hour before your procedure to ensure your preparation). Free valet parking service is available.   Special note: Every effort is made to have your procedure done on time. Please understand that emergencies sometimes delay scheduled procedures.  2. Diet: Do not eat or drink anything after midnight prior to your procedure except sips of water to  take medications.  3. Labs: Today at the [facility]  4. Medication instructions in preparation for your procedure:   On the morning of your procedure, take your Aspirin and any morning medicines NOT listed above.  You may use sips of water.  5. Plan for one night stay--bring personal belongings. 6. Bring a current list of your medications and current insurance cards. 7. You MUST have a responsible person to drive you home. 8. Someone MUST be with you the first 24 hours after you arrive home or your discharge will be delayed. 9. Please wear clothes that are easy to get on and off and wear slip-on shoes.  Thank you for allowing [place] to care for you!   -- [facility]   Follow-Up: Your physician recommends that you schedule a follow-up appointment in: 1 month with Dr. Fletcher Anon.    Any Other Special Instructions Will Be Listed Below (If Applicable).     If you need a refill on your cardiac medications before your next appointment, please call your pharmacy.   Angiogram An angiogram, also called angiography, is a procedure used to look at the blood vessels. In this procedure, dye is injected through a long, thin tube (catheter) into an artery. X-rays are then taken. The X-rays will show if there is a blockage or problem in a blood vessel. Tell a health care provider about:  Any allergies you have, including allergies to shellfish or contrast dye.  All medicines you are taking, including vitamins, herbs, eye drops, creams, and over-the-counter medicines.  Any problems you or family members have had with anesthetic medicines.  Any blood disorders you have.  Any surgeries you have  had.  Any previous kidney problems or failure you have had.  Any medical conditions you have.  Possibility of pregnancy, if this applies. What are the risks? Generally, an angiogram is a safe procedure. However, as with any procedure, problems can occur. Possible  problems include:  Injury to the blood vessels, including rupture or bleeding.  Infection or bruising at the catheter site.  Allergic reaction to the dye or contrast used.  Kidney damage from the dye or contrast used.  Blood clots that can lead to a stroke or heart attack.  What happens before the procedure?  Do not eat or drink after midnight on the night before the procedure, or as directed by your health care provider.  Ask your health care provider if you may drink enough water to take any needed medicines the morning of the procedure. What happens during the procedure?  You may be given a medicine to help you relax (sedative) before and during the procedure. This medicine is given through an IV access tube that is inserted into one of your veins.  The area where the catheter will be inserted will be washed and shaved. This is usually done in the groin but may be done in the fold of your arm (near your elbow) or in the wrist.  A medicine will be given to numb the area where the catheter will be inserted (local anesthetic).  The catheter will be inserted with a guide wire into an artery. The catheter is guided by using a type of X-ray (fluoroscopy) to the blood vessel being examined.  Dye is then injected into the catheter, and X-rays are taken. The dye helps to show where any narrowing or blockages are located. What happens after the procedure?  If the procedure is done through the leg, you will be kept in bed lying flat for several hours. You will be instructed to not bend or cross your legs.  The insertion site will be checked frequently.  The pulse in your feet or wrist will be checked frequently.  Additional blood tests, X-rays, and electrocardiography may be done.  You may need to stay in the hospital overnight for observation. This information is not intended to replace advice given to you by your health care provider. Make sure you discuss any questions you have with  your health care provider. Document Released: 11/17/2004 Document Revised: 07/22/2015 Document Reviewed: 07/11/2012 Elsevier Interactive Patient Education  2017 [place] After This sheet gives you information about how to care for yourself after your procedure. Your doctor may also give you more specific instructions. If you have problems or questions, contact your doctor. Follow these instructions at home: Insertion site care  Follow instructions from your doctor about how to take care of your long, thin tube (catheter) insertion area. Make sure you: ? Wash your hands with soap and water before you change your bandage (dressing). If you cannot use soap and water, use hand sanitizer. ? Change your bandage as told by your doctor. ? Leave stitches (sutures), skin glue, or skin tape (adhesive) strips in place. They may need to stay in place for 2 weeks or longer. If tape strips get loose and curl up, you may trim the loose edges. Do not remove tape strips completely unless your doctor says it is okay.  Do not take baths, swim, or use a hot tub until your doctor says it is okay.  You may shower 24-48 hours after the procedure or as told  by your doctor. ? Gently wash the area with plain soap and water. ? Pat the area dry with a clean towel. ? Do not rub the area. This may cause bleeding.  Do not apply powder or lotion to the area. Keep the area clean and dry.  Check your insertion area every day for signs of infection. Check for: ? More redness, swelling, or pain. ? Fluid or blood. ? Warmth. ? Pus or a bad smell. Activity  Rest as told by your doctor, usually for 1-2 days.  Do not lift anything that is heavier than 10 lbs. (4.5 kg) or as told by your doctor.  Do not drive for 24 hours if you were given a medicine to help you relax (sedative).  Do not drive or use heavy machinery while taking prescription pain medicine. General instructions  Go back to your  normal activities as told by your doctor, usually in about a week. Ask your doctor what activities are safe for you.  If the insertion area starts to bleed, lie flat and put pressure on the area. If the bleeding does not stop, get help right away. This is an emergency.  Drink enough fluid to keep your pee (urine) clear or pale yellow.  Take over-the-counter and prescription medicines only as told by your doctor.  Keep all follow-up visits as told by your doctor. This is important. Contact a doctor if:  You have a fever.  You have chills.  You have more redness, swelling, or pain around your insertion area.  You have fluid or blood coming from your insertion area.  The insertion area feels warm to the touch.  You have pus or a bad smell coming from your insertion area.  You have more bruising around the insertion area.  Blood collects in the tissue around the insertion area (hematoma) that may be painful to the touch. Get help right away if:  You have a lot of pain in the insertion area.  The insertion area swells very fast.  The insertion area is bleeding, and the bleeding does not stop after holding steady pressure on the area.  The area near or just beyond the insertion area becomes pale, cool, tingly, or numb. These symptoms may be an emergency. Do not wait to see if the symptoms will go away. Get medical help right away. Call your local emergency services (911 in the [place] Do not drive yourself to the hospital. Summary  After the procedure, it is common to have bruising and tenderness at the long, thin tube insertion area.  After the procedure, it is important to rest and drink plenty of fluids.  Do not take baths, swim, or use a hot tub until your doctor says it is okay to do so. You may shower 24-48 hours after the procedure or as told by your doctor.  If the insertion area starts to bleed, lie flat and put pressure on the area. If the bleeding does not stop, get  help right away. This is an emergency. This information is not intended to replace advice given to you by your health care provider. Make sure you discuss any questions you have with your health care provider. Document Released: 05/06/2008 Document Revised: 02/02/2016 Document Reviewed: 02/02/2016 Elsevier Interactive Patient Education  2017 [employer].

## 2017-05-23 NOTE — Progress Notes (Signed)
Cardiology Office Note   Date:  05/23/2017   ID:  Bobby Flowers, DOB [birth year], MRN [id number]  PCP:  [name], MD  Cardiologist:   [name], MD   Chief Complaint  Patient presents with  . other    Pt. c/o chest heaviness/indigestion and shortness of breath with little exertion; symptoms come and go. Meds reviewed by the pt. verbally.       History of Present Illness: Bobby Flowers is a 67 y.o. male who presents for evaluation of new symptoms of chest tightness and shortness of breath.  He is followed by Bobby Flowers for  persistent atrial fibrillation and mitral valve prolapse with mild regurgitation. He has prolonged history of atrial fibrillation which was diagnosed in the 65s.   He was briefly on warfarin but had an allergic reaction with a rash. He has not been on any other blood thinners since then given his low CHADS2 VASc score.  He quit smoking in 2002 and does not consume alcohol. He was seen by Dr. Rayann Heman in 2015 for consultation regarding possible catheter ablation for atrial fibrillation. Given stability on low-dose amiodarone, which was best decided to continue with medical therapy. He had recurrent atrial fibrillation in 2016 but he converted back to sinus rhythm after increasing the dose of amiodarone.  Most recent echocardiogram in August 2017 showed normal LV systolic function and atrial size and no evidence of pulmonary hypertension.  Over the last 6 weeks, he has experienced progressive substernal exertional tightness with associated shortness of breath.  This has been progressive and happening with minimal exertion.  He had some symptoms at rest today.  He feels that he has no energy.  Past Medical History:  Diagnosis Date  . Headache   . Hypothyroidism   . Mitral valve prolapse    a. 09/2015 Echo: EF 55-60%, no rwma, nl LA size, nl RV fxn, nl PASP.  [name]   . PAF (paroxysmal atrial fibrillation) ([place])    a. Dx in 1980's; b. CHA2DS2VASc = 1 -  never on Almyra; c. Chronic Amio 100mg  QD.    Past Surgical History:  Procedure Laterality Date  . COLONOSCOPY  04/25/05  . COLONOSCOPY WITH PROPOFOL N/A 07/22/2015   Procedure: COLONOSCOPY WITH PROPOFOL;  Surgeon: Robert Bellow, MD;  Location: [facility] ENDOSCOPY;  Service: Endoscopy;  Laterality: N/A;  . HEMORROIDECTOMY    . HERNIA REPAIR Left May 22, 2015   Left inguinal hernia repair with medium Ultra Pro mesh  . INGUINAL HERNIA REPAIR Left 07/22/2015   Procedure: HERNIA REPAIR INGUINAL ADULT;  Surgeon: Robert Bellow, MD;  Location: [facility] ORS;  Service: General;  Laterality: Left;  . NASAL SINUS SURGERY    . VASECTOMY       Current Outpatient Medications  Medication Sig Dispense Refill  . amiodarone (PACERONE) 100 MG tablet TAKE 1 TABLET (100 MG TOTAL) BY MOUTH DAILY. 90 tablet 3  . atenolol (TENORMIN) 100 MG tablet TAKE 1 TABLET (100 MG TOTAL) BY MOUTH DAILY. 90 tablet 3  . latanoprost (XALATAN) 0.005 % ophthalmic solution     . levothyroxine (SYNTHROID, LEVOTHROID) 75 MCG tablet Take 75 mcg by mouth daily before breakfast. Reported on 08/05/2015    . SUMAtriptan (IMITREX) 100 MG tablet Take 100 mg by mouth as needed.     No current facility-administered medications for this visit.     Allergies:   Coumadin [warfarin]    Social History:  The patient  reports that he quit  smoking about 16 years ago. His smoking use included cigarettes. He has a 46.50 pack-year smoking history. He has never used smokeless tobacco. He reports that he does not drink alcohol or use drugs.   Family History:  The patient's family history includes Alcoholism in his brother; Heart failure in his mother; Leukemia in his father.    ROS:  Please see the history of present illness.   Otherwise, review of systems are positive for none.   All other systems are reviewed and negative.    PHYSICAL EXAM: VS:  BP 120/80 (BP Location: Left Arm, Patient Position: Sitting, Cuff Size: Normal)   Pulse (!) 52   Ht  5\' 9"  (1.753 m)   Wt 181 lb 8 oz (82.3 kg)   BMI 26.80 kg/m  , BMI Body mass index is 26.8 kg/m. GEN: Well nourished, well developed, in no acute distress  HEENT: normal  Neck: no JVD, carotid bruits, or masses Cardiac: RRR; rubs, or gallops,no edema .  1 out of 6 systolic murmur at the base. Respiratory:  clear to auscultation bilaterally, normal work of breathing GI: soft, nontender, nondistended, + BS MS: no deformity or atrophy  Skin: warm and dry, no rash Neuro:  Strength and sensation are intact Psych: euthymic mood, full affect   EKG:  EKG is ordered today. The ekg ordered today demonstrates sinus bradycardia with possible left atrial enlargement.  Old anterolateral infarct.  This pattern was present on previous EKG.  Recent Labs: No results found for requested labs within last 8760 hours.    Lipid Panel No results found for: CHOL, TRIG, HDL, CHOLHDL, VLDL, LDLCALC, LDLDIRECT    Wt Readings from Last 3 Encounters:  05/23/17 181 lb 8 oz (82.3 kg)  01/06/17 174 lb (78.9 kg)  09/18/15 174 lb 10 oz (79.2 kg)         ASSESSMENT AND PLAN:  1.  Unstable angina: The patient's symptoms are worrisome for unstable angina with progressive symptoms.  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  However, this pattern was present on his old EKGs.  Given his symptoms, I have recommended proceeding with cardiac catheterization and possible PCI.  I discussed the procedure in details as well as risks and benefits. I added aspirin 81 mg once daily.  2.  Persistent atrial fibrillation: Maintaining in sinus rhythm with small dose amiodarone.  CHADS VASc score is 1.  Normal liver profile and TSH on amiodarone.  3.  Mitral valve disease with mitral valve prolapse: Most recent echocardiogram showed no significant regurgitation.  4.  The patient lipid profile did not show significant hyperlipidemia.  However, if cardiac cath confirms obstructive coronary artery disease, he will be  started on a statin regardless.   Disposition:   FU with me in 1 months  Signed,  [name], MD  05/23/2017 3:36 PM    [place]

## 2017-05-29 ENCOUNTER — Ambulatory Visit
Admission: RE | Admit: 2017-05-29 | Discharge: 2017-05-29 | Disposition: A | Discharge status: Home / Self Care | Payer: BLUE CROSS/BLUE SHIELD | Source: Ambulatory Visit | Attending: Cardiovascular Disease | Admitting: Cardiovascular Disease

## 2017-05-29 ENCOUNTER — Encounter
Admission: RE | Disposition: A | Discharge status: Home / Self Care | Payer: Self-pay | Source: Ambulatory Visit | Attending: Cardiovascular Disease

## 2017-05-29 ENCOUNTER — Encounter: Payer: Self-pay | Admitting: Emergency Medicine

## 2017-05-29 DIAGNOSIS — Z79890 Hormone replacement therapy (postmenopausal): Secondary | ICD-10-CM | POA: Insufficient documentation

## 2017-05-29 DIAGNOSIS — E039 Hypothyroidism, unspecified: Secondary | ICD-10-CM | POA: Diagnosis not present

## 2017-05-29 DIAGNOSIS — Z79899 Other long term (current) drug therapy: Secondary | ICD-10-CM | POA: Diagnosis not present

## 2017-05-29 DIAGNOSIS — I481 Persistent atrial fibrillation: Secondary | ICD-10-CM | POA: Insufficient documentation

## 2017-05-29 DIAGNOSIS — I341 Nonrheumatic mitral (valve) prolapse: Secondary | ICD-10-CM | POA: Insufficient documentation

## 2017-05-29 DIAGNOSIS — Z87891 Personal history of nicotine dependence: Secondary | ICD-10-CM | POA: Insufficient documentation

## 2017-05-29 DIAGNOSIS — I200 Unstable angina: Secondary | ICD-10-CM | POA: Diagnosis not present

## 2017-05-29 DIAGNOSIS — R079 Chest pain, unspecified: Secondary | ICD-10-CM | POA: Diagnosis present

## 2017-05-29 HISTORY — PX: LEFT HEART CATH AND CORONARY ANGIOGRAPHY: CATH118249

## 2017-05-29 SURGERY — LEFT HEART CATH AND CORONARY ANGIOGRAPHY
Anesthesia: Moderate Sedation | Laterality: Left

## 2017-05-29 MED ORDER — MIDAZOLAM HCL 2 MG/2ML IJ SOLN
INTRAMUSCULAR | Status: AC
  Filled 2017-05-29: qty 2

## 2017-05-29 MED ORDER — SODIUM CHLORIDE 0.9 % IV SOLN: 250.0000 mL | INTRAVENOUS | Status: DC | PRN

## 2017-05-29 MED ORDER — IOPAMIDOL (ISOVUE-300) INJECTION 61%
INTRAVENOUS | Status: DC | PRN
  Administered 2017-05-29: 55 mL via INTRA_ARTERIAL

## 2017-05-29 MED ORDER — SODIUM CHLORIDE 0.9% FLUSH: 3.0000 mL | Freq: Two times a day (BID) | INTRAVENOUS | Status: DC

## 2017-05-29 MED ORDER — VERAPAMIL HCL 2.5 MG/ML IV SOLN
INTRAVENOUS | Status: AC
  Filled 2017-05-29: qty 2

## 2017-05-29 MED ORDER — ASPIRIN 81 MG PO CHEW: 81.0000 mg | CHEWABLE_TABLET | ORAL | Status: DC

## 2017-05-29 MED ORDER — VERAPAMIL HCL 2.5 MG/ML IV SOLN
INTRAVENOUS | Status: DC | PRN
  Administered 2017-05-29: 2.5 mg via INTRAVENOUS

## 2017-05-29 MED ORDER — FENTANYL CITRATE (PF) 100 MCG/2ML IJ SOLN
INTRAMUSCULAR | Status: AC
  Filled 2017-05-29: qty 2

## 2017-05-29 MED ORDER — SODIUM CHLORIDE 0.9% FLUSH: 3.0000 mL | INTRAVENOUS | Status: DC | PRN

## 2017-05-29 MED ORDER — SODIUM CHLORIDE 0.9 % WEIGHT BASED INFUSION
3.0000 mL/kg/h | INTRAVENOUS | Status: AC
  Administered 2017-05-29: 3 mL/kg/h via INTRAVENOUS

## 2017-05-29 MED ORDER — SODIUM CHLORIDE 0.9 % IV SOLN: INTRAVENOUS | Status: DC

## 2017-05-29 MED ORDER — HEPARIN (PORCINE) IN NACL 2-0.9 UNIT/ML-% IJ SOLN
INTRAMUSCULAR | Status: AC
  Filled 2017-05-29: qty 1000

## 2017-05-29 MED ORDER — HEPARIN SODIUM (PORCINE) 1000 UNIT/ML IJ SOLN
INTRAMUSCULAR | Status: AC
  Filled 2017-05-29: qty 1

## 2017-05-29 MED ORDER — HEPARIN SODIUM (PORCINE) 1000 UNIT/ML IJ SOLN
INTRAMUSCULAR | Status: DC | PRN
  Administered 2017-05-29: 4000 [IU] via INTRAVENOUS

## 2017-05-29 MED ORDER — VERAPAMIL HCL 2.5 MG/ML IV SOLN
INTRAVENOUS | Status: AC
Start: 2017-05-29 — End: 2017-05-29
  Filled 2017-05-29: qty 2

## 2017-05-29 MED ORDER — ACETAMINOPHEN 325 MG PO TABS: 650.0000 mg | ORAL_TABLET | ORAL | Status: DC | PRN

## 2017-05-29 MED ORDER — MIDAZOLAM HCL 2 MG/2ML IJ SOLN
INTRAMUSCULAR | Status: DC | PRN
  Administered 2017-05-29: 1 mg via INTRAVENOUS

## 2017-05-29 MED ORDER — FENTANYL CITRATE (PF) 100 MCG/2ML IJ SOLN
INTRAMUSCULAR | Status: DC | PRN
  Administered 2017-05-29: 25 ug via INTRAVENOUS

## 2017-05-29 MED ORDER — SODIUM CHLORIDE 0.9 % WEIGHT BASED INFUSION: 1.0000 mL/kg/h | INTRAVENOUS | Status: DC

## 2017-05-29 SURGICAL SUPPLY — 9 items
CANNULA 5F STIFF (CANNULA) ×3 IMPLANT
CATH INFINITI 5FR ANG PIGTAIL (CATHETERS) ×3 IMPLANT
CATH OPTITORQUE JACKY 4.0 5F (CATHETERS) ×3 IMPLANT
DEVICE RAD TR BAND REGULAR (VASCULAR PRODUCTS) ×3 IMPLANT
KIT MANI 3VAL PERCEP (MISCELLANEOUS) ×3 IMPLANT
PACK CARDIAC CATH (CUSTOM PROCEDURE TRAY) ×3 IMPLANT
SHEATH GLIDE SLENDER 4/5FR (SHEATH) ×3 IMPLANT
SHEATH RAIN RADIAL 21G 6FR (SHEATH) ×3 IMPLANT
WIRE ROSEN-J .035X260CM (WIRE) ×3 IMPLANT

## 2017-05-29 NOTE — Discharge Instructions (Signed)
Angiogram, Care After °This sheet gives you information about how to care for yourself after your procedure. Your health care provider may also give you more specific instructions. If you have problems or questions, contact your health care provider. °What can I expect after the procedure? °After the procedure, it is common to have bruising and tenderness at the catheter insertion area. °Follow these instructions at home: °Insertion site care °· Follow instructions from your health care provider about how to take care of your insertion site. Make sure you: °? Wash your hands with soap and water before you change your bandage (dressing). If soap and water are not available, use hand sanitizer. °? Change your dressing as told by your health care provider. °? Leave stitches (sutures), skin glue, or adhesive strips in place. These skin closures may need to stay in place for 2 weeks or longer. If adhesive strip edges start to loosen and curl up, you may trim the loose edges. Do not remove adhesive strips completely unless your health care provider tells you to do that. °· Do not take baths, swim, or use a hot tub until your health care provider approves. °· You may shower 24-48 hours after the procedure or as told by your health care provider. °? Gently wash the site with plain soap and water. °? Pat the area dry with a clean towel. °? Do not rub the site. This may cause bleeding. °· Do not apply powder or lotion to the site. Keep the site clean and dry. °· Check your insertion site every day for signs of infection. Check for: °? Redness, swelling, or pain. °? Fluid or blood. °? Warmth. °? Pus or a bad smell. °Activity °· Rest as told by your health care provider, usually for 1-2 days. °· Do not lift anything that is heavier than 10 lbs. (4.5 kg) or as told by your health care provider. °· Do not drive for 24 hours if you were given a medicine to help you relax (sedative). °· Do not drive or use heavy machinery while  taking prescription pain medicine. °General instructions °· Return to your normal activities as told by your health care provider, usually in about a week. Ask your health care provider what activities are safe for you. °· If the catheter site starts bleeding, lie flat and put pressure on the site. If the bleeding does not stop, get help right away. This is a medical emergency. °· Drink enough fluid to keep your urine clear or pale yellow. This helps flush the contrast dye from your body. °· Take over-the-counter and prescription medicines only as told by your health care provider. °· Keep all follow-up visits as told by your health care provider. This is important. °Contact a health care provider if: °· You have a fever or chills. °· You have redness, swelling, or pain around your insertion site. °· You have fluid or blood coming from your insertion site. °· The insertion site feels warm to the touch. °· You have pus or a bad smell coming from your insertion site. °· You have bruising around the insertion site. °· You notice blood collecting in the tissue around the catheter site (hematoma). The hematoma may be painful to the touch. °Get help right away if: °· You have severe pain at the catheter insertion area. °· The catheter insertion area swells very fast. °· The catheter insertion area is bleeding, and the bleeding does not stop when you hold steady pressure on the area. °·   The area near or just beyond the catheter insertion site becomes pale, cool, tingly, or numb. These symptoms may represent a serious problem that is an emergency. Do not wait to see if the symptoms will go away. Get medical help right away. Call your local emergency services (911 in the [place] Do not drive yourself to the hospital. Summary  After the procedure, it is common to have bruising and tenderness at the catheter insertion area.  After the procedure, it is important to rest and drink plenty of fluids.  Do not take baths,  swim, or use a hot tub until your health care provider says it is okay to do so. You may shower 24-48 hours after the procedure or as told by your health care provider.  If the catheter site starts bleeding, lie flat and put pressure on the site. If the bleeding does not stop, get help right away. This is a medical emergency. This information is not intended to replace advice given to you by your health care provider. Make sure you discuss any questions you have with your health care provider. Document Released: 08/26/2004 Document Revised: 01/13/2016 Document Reviewed: 01/13/2016 Elsevier Interactive Patient Education  2018 [place] Refer to this sheet in the next few weeks. These instructions provide you with information about caring for yourself after your procedure. Your health care provider may also give you more specific instructions. Your treatment has been planned according to current medical practices, but problems sometimes occur. Call your health care provider if you have any problems or questions after your procedure. What can I expect after the procedure? After your procedure, it is typical to have the following:  Bruising at the radial site that usually fades within 1-2 weeks.  Blood collecting in the tissue (hematoma) that may be painful to the touch. It should usually decrease in size and tenderness within 1-2 weeks.  Follow these instructions at home:  Take medicines only as directed by your health care provider.  You may shower 24-48 hours after the procedure or as directed by your health care provider. Remove the bandage (dressing) and gently wash the site with plain soap and water. Pat the area dry with a clean towel. Do not rub the site, because this may cause bleeding.  Do not take baths, swim, or use a hot tub until your health care provider approves.  Check your insertion site every day for redness, swelling, or drainage.  Do not apply powder or  lotion to the site.  Do not flex or bend the affected arm for 24 hours or as directed by your health care provider.  Do not push or pull heavy objects with the affected arm for 24 hours or as directed by your health care provider.  Do not lift over 10 lb (4.5 kg) for 5 days after your procedure or as directed by your health care provider.  Ask your health care provider when it is okay to: ? Return to work or school. ? Resume usual physical activities or sports. ? Resume sexual activity.  Do not drive home if you are discharged the same day as the procedure. Have someone else drive you.  You may drive 24 hours after the procedure unless otherwise instructed by your health care provider.  Do not operate machinery or power tools for 24 hours after the procedure.  If your procedure was done as an outpatient procedure, which means that you went home the same day as your procedure, a  responsible adult should be with you for the first 24 hours after you arrive home.  Keep all follow-up visits as directed by your health care provider. This is important. Contact a health care provider if:  You have a fever.  You have chills.  You have increased bleeding from the radial site. Hold pressure on the site. Get help right away if:  You have unusual pain at the radial site.  You have redness, warmth, or swelling at the radial site.  You have drainage (other than a small amount of blood on the dressing) from the radial site.  The radial site is bleeding, and the bleeding does not stop after 30 minutes of holding steady pressure on the site.  Your arm or hand becomes pale, cool, tingly, or numb. This information is not intended to replace advice given to you by your health care provider. Make sure you discuss any questions you have with your health care provider. Document Released: 03/12/2010 Document Revised: 07/16/2015 Document Reviewed: 08/26/2013 Elsevier Interactive Patient Education   2018 [place].  Moderate Conscious Sedation, Adult, Care After These instructions provide you with information about caring for yourself after your procedure. Your health care provider may also give you more specific instructions. Your treatment has been planned according to current medical practices, but problems sometimes occur. Call your health care provider if you have any problems or questions after your procedure. What can I expect after the procedure? After your procedure, it is common:  To feel sleepy for several hours.  To feel clumsy and have poor balance for several hours.  To have poor judgment for several hours.  To vomit if you eat too soon.  Follow these instructions at home: For at least 24 hours after the procedure:   Do not: ? Participate in activities where you could fall or become injured. ? Drive. ? Use heavy machinery. ? Drink alcohol. ? Take sleeping pills or medicines that cause drowsiness. ? Make important decisions or sign legal documents. ? Take care of children on your own.  Rest. Eating and drinking  Follow the diet recommended by your health care provider.  If you vomit: ? Drink water, juice, or soup when you can drink without vomiting. ? Make sure you have little or no nausea before eating solid foods. General instructions  Have a responsible adult stay with you until you are awake and alert.  Take over-the-counter and prescription medicines only as told by your health care provider.  If you smoke, do not smoke without supervision.  Keep all follow-up visits as told by your health care provider. This is important. Contact a health care provider if:  You keep feeling nauseous or you keep vomiting.  You feel light-headed.  You develop a rash.  You have a fever. Get help right away if:  You have trouble breathing. This information is not intended to replace advice given to you by your health care provider. Make sure you discuss  any questions you have with your health care provider. Document Released: 11/28/2012 Document Revised: 07/13/2015 Document Reviewed: 05/30/2015 Elsevier Interactive Patient Education  [employer].

## 2017-05-29 NOTE — Interval H&P Note (Signed)
Cath Lab Visit (complete for each Cath Lab visit)  Clinical Evaluation Leading to the Procedure:   ACS: No.  Non-ACS:    Anginal Classification: CCS IV  Anti-ischemic medical therapy: Minimal Therapy (1 class of medications)  Non-Invasive Test Results: No non-invasive testing performed  Prior CABG: No previous CABG      History and Physical Interval Note:  05/29/2017 11:14 AM  Bobby Flowers  has presented today for surgery, with the diagnosis of LT Cath w possible PCI    Chest pain  CAD  The various methods of treatment have been discussed with the patient and family. After consideration of risks, benefits and other options for treatment, the patient has consented to  Procedure(s): LEFT HEART CATH AND CORONARY ANGIOGRAPHY (Left) as a surgical intervention .  The patient's history has been reviewed, patient examined, no change in status, stable for surgery.  I have reviewed the patient's chart and labs.  Questions were answered to the patient's satisfaction.     Bobby Flowers

## 2017-05-30 ENCOUNTER — Other Ambulatory Visit: Payer: Self-pay

## 2017-05-30 ENCOUNTER — Telehealth: Payer: Self-pay | Admitting: Cardiovascular Disease

## 2017-05-30 DIAGNOSIS — R0602 Shortness of breath: Secondary | ICD-10-CM

## 2017-05-30 DIAGNOSIS — R079 Chest pain, unspecified: Secondary | ICD-10-CM

## 2017-05-30 NOTE — Telephone Encounter (Signed)
  Bobby Hampshire, MD  Georgiana Shore, RN        I did catheterization today but he needs to be scheduled for an echocardiogram (for shortness of breath and chest pain). Thanks.    S/w patient who is agreeable with plan. Transferred to [place] to schedule appt.

## 2017-06-01 ENCOUNTER — Encounter: Payer: Self-pay | Admitting: Nurse Practitioner

## 2017-06-01 ENCOUNTER — Telehealth: Payer: Self-pay | Admitting: Cardiovascular Disease

## 2017-06-01 ENCOUNTER — Ambulatory Visit (INDEPENDENT_AMBULATORY_CARE_PROVIDER_SITE_OTHER): Payer: BLUE CROSS/BLUE SHIELD | Admitting: Nurse Practitioner

## 2017-06-01 VITALS — BP 118/80 | HR 58

## 2017-06-01 DIAGNOSIS — I480 Paroxysmal atrial fibrillation: Secondary | ICD-10-CM | POA: Diagnosis not present

## 2017-06-01 DIAGNOSIS — I341 Nonrheumatic mitral (valve) prolapse: Secondary | ICD-10-CM | POA: Diagnosis not present

## 2017-06-01 MED ORDER — AMIODARONE HCL 200 MG PO TABS: 200.0000 mg | ORAL_TABLET | Freq: Every day | ORAL | 3 refills | Status: DC

## 2017-06-01 NOTE — Telephone Encounter (Signed)
Patient is scheduled to come in today at 11:30 am to see Ignacia Bayley, NP. Patient is aware.

## 2017-06-01 NOTE — Progress Notes (Signed)
Office Visit    Patient Name: Bobby Flowers Date of Encounter: 06/01/2017  Primary Care Provider:  [name], MD Primary Cardiologist:  [name], MD  Chief Complaint    67 year old male with a history of paroxysmal atrial for ablation, mitral valve prolapse, hypothyroidism, and recent bouts of exertional chest pain status post catheterization revealing normal coronary arteries, who presents for follow-up related to more frequent paroxysms of atrial fibrillation.  Past Medical History    Past Medical History:  Diagnosis Date  . Chest tightness    a. 05/2017 Cath: nl cors.  . Headache   . Hypothyroidism   . Mitral valve prolapse    a. 09/2015 Echo: EF 55-60%, no rwma, nl LA size, nl RV fxn, nl PASP.  [name]   . PAF (paroxysmal atrial fibrillation) ([place])    a. Dx in 1980's; b. CHA2DS2VASc = 1 - briefly on coumadin many years ago-->caused rash; c. Chronic Amio 100mg  QD.   Past Surgical History:  Procedure Laterality Date  . COLONOSCOPY  04/25/05  . COLONOSCOPY WITH PROPOFOL N/A 07/22/2015   Procedure: COLONOSCOPY WITH PROPOFOL;  Surgeon: Robert Bellow, MD;  Location: [facility] ENDOSCOPY;  Service: Endoscopy;  Laterality: N/A;  . HEMORROIDECTOMY    . HERNIA REPAIR Left May 22, 2015   Left inguinal hernia repair with medium Ultra Pro mesh  . INGUINAL HERNIA REPAIR Left 07/22/2015   Procedure: HERNIA REPAIR INGUINAL ADULT;  Surgeon: Robert Bellow, MD;  Location: [facility] ORS;  Service: General;  Laterality: Left;  . LEFT HEART CATH AND CORONARY ANGIOGRAPHY Left 05/29/2017   Procedure: LEFT HEART CATH AND CORONARY ANGIOGRAPHY;  Surgeon: [name], MD;  Location: [place] CV LAB;  Service: Cardiovascular;  Laterality: Left;  . NASAL SINUS SURGERY    . VASECTOMY      Allergies  Allergies  Allergen Reactions  . Coumadin [Warfarin] Rash    History of Present Illness    67 year old male with the above past medical history including paroxysmal  atrial for ablation dating back to the 69s.  At one point he was on warfarin but had an allergic reaction with a rash.  His CHA2DS2VASc is a 1 in the setting of age of 62 and he is not anticoagulated.  He is on chronic amiodarone therapy, currently at 100 mg daily.  He had previously been evaluated in A. fib clinic in 2015 and it was felt that given stability on low-dose amiodarone, continued medical therapy was appropriate.  He was recently seen in clinic due to exertional chest discomfort.  Given concern for unstable angina, he underwent diagnostic catheterization on April 8, which revealed normal coronary arteries.  He is scheduled to have an echocardiogram next week.  He says that a day or 2 before his catheterization, he began to note increasing paroxysms of atrial fibrillation, typically lasting anywhere between 15 and 45 minutes, associated with fatigue and dyspnea, and resolving spontaneously.  In the past week, the day of his catheterization was the only day he did not have runs of atrial fibrillation.  He has not had any recent change in his diet and has otherwise been feeling well when he is not in A. fib.  He denies any recurrence of exertional chest pain, PND, orthopnea, dizziness, syncope, edema, or early satiety.  Home Medications    Prior to Admission medications   Medication Sig Start Date End Date Taking? Authorizing Provider  aspirin EC 81 MG tablet Take 1 tablet (81 mg  total) by mouth daily. 05/23/17  Yes [name], MD  atenolol (TENORMIN) 100 MG tablet TAKE 1 TABLET (100 MG TOTAL) BY MOUTH DAILY. 05/15/17  Yes [name], MD  latanoprost (XALATAN) 0.005 % ophthalmic solution Place 1 drop into both eyes at bedtime.  04/06/16  Yes [provider]  levothyroxine (SYNTHROID, LEVOTHROID) 75 MCG tablet Take 75 mcg by mouth daily before breakfast. Reported on 08/05/2015 05/05/13  Yes [provider]  SUMAtriptan (IMITREX) 100 MG tablet Take 100 mg by mouth as  needed for migraine.  01/18/16  Yes [provider]  amiodarone (PACERONE) 100 MG tablet TAKE 1 TABLET (100 MG TOTAL) BY MOUTH DAILY. 02/20/17 06/01/17 Yes [name], MD    Review of Systems    Increased frequency of A. fib over the past week or so.  This is associated with dyspnea and fatigue.  He has not had any recurrence of exertional chest pain.  All other systems reviewed and are otherwise negative except as noted above.  Physical Exam    VS:  BP 118/80 (BP Location: Left Arm, Patient Position: Sitting, Cuff Size: Normal)   Pulse (!) 58  , BMI There is no height or weight on file to calculate BMI. GEN: Well nourished, well developed, in no acute distress.  HEENT: normal.  Neck: Supple, no JVD, carotid bruits, or masses. Cardiac: RRR, mid systolic click at the left lower sternal border, no murmurs, rubs, or gallops. No clubbing, cyanosis, edema.  Radials/DP/PT 2+ and equal bilaterally.  Right radial catheterization site is without bleeding, bruit, or hematoma. Respiratory:  Respirations regular and unlabored, clear to auscultation bilaterally. GI: Soft, nontender, nondistended, BS + x 4. MS: no deformity or atrophy. Skin: warm and dry, no rash. Neuro:  Strength and sensation are intact. Psych: Normal affect.  Accessory Clinical Findings    ECG -sinus bradycardia with first-degree AV block, 58, right axis, left atrial enlargement, prior anterolateral infarct.  No acute changes.  Lab Results  Component Value Date   WBC 7.8 05/23/2017   HGB 15.3 05/23/2017   HCT 46.7 05/23/2017   MCV 89.4 05/23/2017   PLT 175 05/23/2017   Lab Results  Component Value Date   CREATININE 0.95 05/23/2017   BUN 21 (H) 05/23/2017   NA 142 05/23/2017   K 4.5 05/23/2017   CL 105 05/23/2017   CO2 31 05/23/2017    TSH PENDING  Assessment & Plan    1.  Paroxysmal atrial fibrillation: Over the past week, patient has been having increased paroxysms of atrial fibrillation,  occurring multiple times per day, associated with dyspnea and fatigue, lasting 15-45 minutes, and resolving spontaneously.  He has been compliant with his amiodarone and atenolol.  He recently had labs in early April which showed stable CBC and basic metabolic panel.  Last TSH was in November and I will follow this up today.  We are going to increase his amiodarone to 200 mg daily.  He will continue atenolol.  I did recommend that he follow his resting heart rate closely as if he had heart rates dipping into the 40s, we would have to come back on his atenolol to 50 mg daily.  In the past when he had similar paroxysms, higher dose of amiodarone was successful in keeping him out of A. fib.  CHA2DS2VASc equals 1 and he prefers to remain on aspirin only.  2.  Exertional chest pain: Resolved.  Recent catheterization showed normal coronary arteries.  3.  Mitral  valve prolapse: He is scheduled for follow-up echo next week.  4.  Disposition: Follow-up TSH today and echo next week.  He has followed up scheduled with Dr. Fletcher Anon in early May.  Murray Hodgkins, NP 06/01/2017, 12:06 PM

## 2017-06-01 NOTE — Telephone Encounter (Signed)
I left a message for the patient to call back 

## 2017-06-01 NOTE — Patient Instructions (Addendum)
Medication Instructions: - Your physician has recommended you make the following change in your medication:   1) INCREASE amiodarone to 200 mg- take 1 tablet (200 mg) by mouth once daily  Labwork: - Your physician recommends that you have lab work today: TSH  Procedures/Testing: - echocardiogram as scheduled  Follow-Up: - as scheduled with Dr. Fletcher Anon.   Any Additional Special Instructions Will Be Listed Below (If Applicable).     If you need a refill on your cardiac medications before your next appointment, please call your pharmacy.

## 2017-06-01 NOTE — Telephone Encounter (Signed)
New Message  Pt verbalized he's been going in and out of afib for several days now but there were no avail slots for pt sooner than his scheduled appt with Dr. Fletcher Anon.  Advised pt I would send note for nurse to f/u with him.

## 2017-06-02 ENCOUNTER — Telehealth: Payer: Self-pay | Admitting: Cardiovascular Disease

## 2017-06-02 LAB — TSH: TSH: 5.63 u[IU]/mL — ABNORMAL HIGH (ref 0.450–4.500)

## 2017-06-02 NOTE — Telephone Encounter (Signed)
Patient notified of lab results

## 2017-06-02 NOTE — Telephone Encounter (Signed)
Patient calling back for lab results °

## 2017-06-08 ENCOUNTER — Telehealth: Payer: Self-pay | Admitting: Cardiovascular Disease

## 2017-06-08 ENCOUNTER — Other Ambulatory Visit: Payer: BLUE CROSS/BLUE SHIELD

## 2017-06-08 NOTE — Telephone Encounter (Signed)
Lmov for patient to call back Needed to reschedule Echo from Today at 2pm to Next Thursday 06/15/17   lmov to confirm appointment

## 2017-06-15 ENCOUNTER — Other Ambulatory Visit: Payer: BLUE CROSS/BLUE SHIELD

## 2017-06-22 ENCOUNTER — Ambulatory Visit: Payer: BLUE CROSS/BLUE SHIELD

## 2017-06-22 ENCOUNTER — Other Ambulatory Visit: Payer: Self-pay

## 2017-06-22 DIAGNOSIS — I340 Nonrheumatic mitral (valve) insufficiency: Secondary | ICD-10-CM | POA: Diagnosis not present

## 2017-06-22 DIAGNOSIS — R0602 Shortness of breath: Secondary | ICD-10-CM

## 2017-06-22 DIAGNOSIS — R079 Chest pain, unspecified: Secondary | ICD-10-CM

## 2017-06-30 ENCOUNTER — Ambulatory Visit (INDEPENDENT_AMBULATORY_CARE_PROVIDER_SITE_OTHER): Payer: BLUE CROSS/BLUE SHIELD | Admitting: Cardiovascular Disease

## 2017-06-30 ENCOUNTER — Encounter: Payer: Self-pay | Admitting: Cardiovascular Disease

## 2017-06-30 VITALS — BP 100/68 | HR 53 | Ht 69.0 in | Wt 179.5 lb

## 2017-06-30 DIAGNOSIS — I712 Thoracic aortic aneurysm, without rupture: Secondary | ICD-10-CM

## 2017-06-30 DIAGNOSIS — I341 Nonrheumatic mitral (valve) prolapse: Secondary | ICD-10-CM | POA: Diagnosis not present

## 2017-06-30 DIAGNOSIS — I481 Persistent atrial fibrillation: Secondary | ICD-10-CM

## 2017-06-30 DIAGNOSIS — I4819 Other persistent atrial fibrillation: Secondary | ICD-10-CM

## 2017-06-30 DIAGNOSIS — I7121 Aneurysm of the ascending aorta, without rupture: Secondary | ICD-10-CM

## 2017-06-30 NOTE — Progress Notes (Signed)
Cardiology Office Note   Date:  06/30/2017   ID:  Bobby Flowers, DOB 01/11/1951, MRN [id number]  PCP:  [name], MD  Cardiologist:   [name], MD   Chief Complaint  Patient presents with  . other    1 month follow up & s/p cardiac cath. Meds reviewed by the pt. verbally. "doing well."        History of Present Illness: Bobby Flowers is a 67 y.o. male who presents for a follow-up visit regarding exertional chest pain and shortness of breath.  He has known history of persistent atrial fibrillation and mitral valve prolapse with mild regurgitation. He has prolonged history of atrial fibrillation which was diagnosed in the 60s.  He was briefly on warfarin but had an allergic reaction with a rash. He has not been on any anticoagulant since then given his low CHADS2 VASc score.  He quit smoking in 2002 and does not consume alcohol. He was seen by Dr. Rayann Heman in 2015 for consultation regarding possible catheter ablation for atrial fibrillation. Given stability on low-dose amiodarone, which was best decided to continue with medical therapy. He had recurrent atrial fibrillation in 2016 but he converted back to sinus rhythm after increasing the dose of amiodarone.  Most recent echocardiogram in August 2017 showed normal LV systolic function and atrial size and no evidence of pulmonary hypertension.  He was seen recently for progressive exertional chest tightness and shortness of breath.  I proceeded with cardiac catheterization last month which showed normal coronary arteries.  The heart was noted to be of very unusual horizontal orientation.  Echocardiogram showed normal LV systolic function with mildly dilated a sending aorta at 4.4 cm.  He was seen post-cath for increased episodes of atrial fibrillation.  The dose of amiodarone was increased to 200 mg once daily.  Since then, he had complete resolution of palpitations and also his symptoms of chest pain and shortness of breath  disappeared.  He is feeling back to baseline.   Past Medical History:  Diagnosis Date  . Chest tightness    a. 05/2017 Cath: nl cors.  . Headache   . Hypothyroidism   . Mitral valve prolapse    a. 09/2015 Echo: EF 55-60%, no rwma, nl LA size, nl RV fxn, nl PASP.  [name]   . PAF (paroxysmal atrial fibrillation) ([place])    a. Dx in 1980's; b. CHA2DS2VASc = 1 - briefly on coumadin many years ago-->caused rash; c. Chronic Amio 100mg  QD.    Past Surgical History:  Procedure Laterality Date  . COLONOSCOPY  04/25/05  . COLONOSCOPY WITH PROPOFOL N/A 07/22/2015   Procedure: COLONOSCOPY WITH PROPOFOL;  Surgeon: Robert Bellow, MD;  Location[facility] ENDOSCOPY;  Service: Endoscopy;  Laterality: N/A;  . HEMORROIDECTOMY    . HERNIA REPAIR Left May 22, 2015   Left inguinal hernia repair with medium Ultra Pro mesh  . INGUINAL HERNIA REPAIR Left 07/22/2015   Procedure: HERNIA REPAIR INGUINAL ADULT;  Surgeon: Robert Bellow, MD;  Location: [facility] ORS;  Service: General;  Laterality: Left;  . LEFT HEART CATH AND CORONARY ANGIOGRAPHY Left 05/29/2017   Procedure: LEFT HEART CATH AND CORONARY ANGIOGRAPHY;  Surgeon: [name], MD;  Location: [place] CV LAB;  Service: Cardiovascular;  Laterality: Left;  . NASAL SINUS SURGERY    . VASECTOMY       Current Outpatient Medications  Medication Sig Dispense Refill  . amiodarone (PACERONE) 200 MG tablet Take 1 tablet (  200 mg total) by mouth daily. 90 tablet 3  . aspirin EC 81 MG tablet Take 1 tablet (81 mg total) by mouth daily. 90 tablet 3  . atenolol (TENORMIN) 100 MG tablet TAKE 1 TABLET (100 MG TOTAL) BY MOUTH DAILY. 90 tablet 3  . latanoprost (XALATAN) 0.005 % ophthalmic solution Place 1 drop into both eyes at bedtime.     Marland Kitchen levothyroxine (SYNTHROID, LEVOTHROID) 75 MCG tablet Take 75 mcg by mouth daily before breakfast. Reported on 08/05/2015    . SUMAtriptan (IMITREX) 100 MG tablet Take 100 mg by mouth as needed for migraine.       No current facility-administered medications for this visit.     Allergies:   Coumadin [warfarin]    Social History:  The patient  reports that he quit smoking about 16 years ago. His smoking use included cigarettes. He has a 46.50 pack-year smoking history. He has never used smokeless tobacco. He reports that he does not drink alcohol or use drugs.   Family History:  The patient's family history includes Alcoholism in his brother; Heart failure in his mother; Leukemia in his father.    ROS:  Please see the history of present illness.   Otherwise, review of systems are positive for none.   All other systems are reviewed and negative.    PHYSICAL EXAM: VS:  BP 100/68 (BP Location: Left Arm, Patient Position: Sitting, Cuff Size: Normal)   Pulse (!) 53   Ht 5\' 9"  (1.753 m)   Wt 179 lb 8 oz (81.4 kg)   BMI 26.51 kg/m  , BMI Body mass index is 26.51 kg/m. GEN: Well nourished, well developed, in no acute distress  HEENT: normal  Neck: no JVD, carotid bruits, or masses Cardiac: RRR; rubs, or gallops,no edema .  1/ 6 systolic murmur at the base. Respiratory:  clear to auscultation bilaterally, normal work of breathing GI: soft, nontender, nondistended, + BS MS: no deformity or atrophy  Skin: warm and dry, no rash Neuro:  Strength and sensation are intact Psych: euthymic mood, full affect Right radial pulses normal with no hematoma.  EKG:  EKG is ordered today. The ekg ordered today demonstrates sinus bradycardia with first-degree AV block.  Possible old anteroseptal infarct but this pattern is likely due to abnormal orientation of the heart.   Recent Labs: 05/23/2017: BUN 21; Creatinine, Ser 0.95; Hemoglobin 15.3; Platelets 175; Potassium 4.5; Sodium 142 06/01/2017: TSH 5.630    Lipid Panel No results found for: CHOL, TRIG, HDL, CHOLHDL, VLDL, LDLCALC, LDLDIRECT    Wt Readings from Last 3 Encounters:  06/30/17 179 lb 8 oz (81.4 kg)  05/29/17 181 lb (82.1 kg)  05/23/17 181  lb 8 oz (82.3 kg)         ASSESSMENT AND PLAN:  1.  Exertional chest pain and shortness of breath: Cardiac catheterization showed normal coronary arteries.  It appears that his symptoms were due to frequent episodes of atrial fibrillation.  Since controlling his A. fib, his symptoms has resolved completely.  2.  Persistent atrial fibrillation: He reports no further episodes since amiodarone was increased to 200 mg once daily.  Continue same dose for now.  His TSH was mildly elevated.  I asked him to follow-up with Dr. Kary Kos for repeat thyroid testing and to see if the dose of Synthroid needs to be increased. We will have to consider pulmonary function testing with diffusion capacity given that he is on amiodarone.  3.  Mitral valve disease with mitral  valve prolapse: Most recent echocardiogram showed mild regurgitation.  4.  Ascending aortic aneurysm at 4.4 cm: This was noted on echocardiogram.  I plan on obtaining CTA of the chest in May 2020 to follow-up.   Disposition:   FU with me in 6 months  Signed,  [name], MD  06/30/2017 3:00 PM    [place]

## 2017-06-30 NOTE — Patient Instructions (Signed)
Medication Instructions: Continue same medications.   Labwork: None.   Procedures/Testing: None.   Follow-Up: 6 months with Dr. Arida.   Any Additional Special Instructions Will Be Listed Below (If Applicable).     If you need a refill on your cardiac medications before your next appointment, please call your pharmacy.   

## 2017-07-28 ENCOUNTER — Emergency Department: Payer: BLUE CROSS/BLUE SHIELD

## 2017-07-28 ENCOUNTER — Encounter
Admission: EM | Disposition: A | Discharge status: Home / Self Care | Payer: Self-pay | Source: Home / Self Care | Attending: Emergency Medicine

## 2017-07-28 ENCOUNTER — Observation Stay: Payer: BLUE CROSS/BLUE SHIELD | Admitting: Anesthesiology

## 2017-07-28 ENCOUNTER — Observation Stay
Admission: EM | Admit: 2017-07-28 | Discharge: 2017-07-29 | Disposition: A | Discharge status: Home / Self Care | Payer: BLUE CROSS/BLUE SHIELD | Attending: Urology | Admitting: Urology

## 2017-07-28 ENCOUNTER — Other Ambulatory Visit: Payer: Self-pay

## 2017-07-28 DIAGNOSIS — Z87891 Personal history of nicotine dependence: Secondary | ICD-10-CM | POA: Insufficient documentation

## 2017-07-28 DIAGNOSIS — R1031 Right lower quadrant pain: Secondary | ICD-10-CM

## 2017-07-28 DIAGNOSIS — E039 Hypothyroidism, unspecified: Secondary | ICD-10-CM | POA: Insufficient documentation

## 2017-07-28 DIAGNOSIS — Z7982 Long term (current) use of aspirin: Secondary | ICD-10-CM | POA: Insufficient documentation

## 2017-07-28 DIAGNOSIS — D72829 Elevated white blood cell count, unspecified: Secondary | ICD-10-CM

## 2017-07-28 DIAGNOSIS — I480 Paroxysmal atrial fibrillation: Secondary | ICD-10-CM | POA: Insufficient documentation

## 2017-07-28 DIAGNOSIS — N201 Calculus of ureter: Secondary | ICD-10-CM | POA: Diagnosis not present

## 2017-07-28 DIAGNOSIS — Z79899 Other long term (current) drug therapy: Secondary | ICD-10-CM | POA: Diagnosis not present

## 2017-07-28 DIAGNOSIS — N200 Calculus of kidney: Secondary | ICD-10-CM

## 2017-07-28 DIAGNOSIS — N132 Hydronephrosis with renal and ureteral calculous obstruction: Secondary | ICD-10-CM | POA: Diagnosis not present

## 2017-07-28 DIAGNOSIS — N179 Acute kidney failure, unspecified: Secondary | ICD-10-CM

## 2017-07-28 HISTORY — PX: CYSTOSCOPY WITH HOLMIUM LASER LITHOTRIPSY: SHX6639

## 2017-07-28 HISTORY — PX: CYSTOSCOPY WITH URETEROSCOPY: SHX5123

## 2017-07-28 HISTORY — PX: CYSTOSCOPY WITH STENT PLACEMENT: SHX5790

## 2017-07-28 LAB — CBC WITH DIFFERENTIAL/PLATELET
Basophils Absolute: 0.1 10*3/uL (ref 0–0.1)
Basophils Relative: 0 %
Eosinophils Absolute: 0 10*3/uL (ref 0–0.7)
Eosinophils Relative: 0 %
HCT: 46.4 % (ref 40.0–52.0)
HEMOGLOBIN: 15.9 g/dL (ref 13.0–18.0)
LYMPHS ABS: 0.9 10*3/uL — AB (ref 1.0–3.6)
Lymphocytes Relative: 6 %
MCH: 30.2 pg (ref 26.0–34.0)
MCHC: 34.2 g/dL (ref 32.0–36.0)
MCV: 88.2 fL (ref 80.0–100.0)
MONO ABS: 0.9 10*3/uL (ref 0.2–1.0)
MONOS PCT: 6 %
NEUTROS PCT: 88 %
Neutro Abs: 12.4 10*3/uL — ABNORMAL HIGH (ref 1.4–6.5)
Platelets: 177 10*3/uL (ref 150–440)
RBC: 5.26 MIL/uL (ref 4.40–5.90)
RDW: 13.8 % (ref 11.5–14.5)
WBC: 14.3 10*3/uL — ABNORMAL HIGH (ref 3.8–10.6)

## 2017-07-28 LAB — COMPREHENSIVE METABOLIC PANEL
ALT: 15 U/L — ABNORMAL LOW (ref 17–63)
ANION GAP: 9 (ref 5–15)
AST: 20 U/L (ref 15–41)
Albumin: 4.1 g/dL (ref 3.5–5.0)
Alkaline Phosphatase: 69 U/L (ref 38–126)
BUN: 26 mg/dL — ABNORMAL HIGH (ref 6–20)
CALCIUM: 8.5 mg/dL — AB (ref 8.9–10.3)
CO2: 24 mmol/L (ref 22–32)
Chloride: 99 mmol/L — ABNORMAL LOW (ref 101–111)
Creatinine, Ser: 1.27 mg/dL — ABNORMAL HIGH (ref 0.61–1.24)
GFR, EST NON AFRICAN AMERICAN: 58 mL/min — AB (ref >60)
Glucose, Bld: 92 mg/dL (ref 65–99)
POTASSIUM: 4.1 mmol/L (ref 3.5–5.1)
Sodium: 132 mmol/L — ABNORMAL LOW (ref 135–145)
TOTAL PROTEIN: 6.8 g/dL (ref 6.5–8.1)
Total Bilirubin: 1.6 mg/dL — ABNORMAL HIGH (ref 0.3–1.2)

## 2017-07-28 LAB — URINALYSIS, COMPLETE (UACMP) WITH MICROSCOPIC
Bacteria, UA: NONE SEEN
Bilirubin Urine: NEGATIVE
GLUCOSE, UA: NEGATIVE mg/dL
Ketones, ur: 20 mg/dL — AB
LEUKOCYTES UA: NEGATIVE
NITRITE: NEGATIVE
PH: 6 (ref 5.0–8.0)
Protein, ur: NEGATIVE mg/dL
SPECIFIC GRAVITY, URINE: 1.014 (ref 1.005–1.030)
SQUAMOUS EPITHELIAL / LPF: NONE SEEN (ref 0–5)

## 2017-07-28 SURGERY — CYSTOSCOPY WITH URETEROSCOPY
Anesthesia: General | Site: Ureter | Laterality: Bilateral | Wound class: Clean Contaminated

## 2017-07-28 MED ORDER — PROPOFOL 10 MG/ML IV BOLUS
INTRAVENOUS | Status: AC
  Filled 2017-07-28: qty 20

## 2017-07-28 MED ORDER — SENNA 8.6 MG PO TABS: 1.0000 | ORAL_TABLET | Freq: Two times a day (BID) | ORAL | Status: DC

## 2017-07-28 MED ORDER — ACETAMINOPHEN 325 MG PO TABS: 650.0000 mg | ORAL_TABLET | ORAL | Status: DC | PRN

## 2017-07-28 MED ORDER — LIDOCAINE HCL URETHRAL/MUCOSAL 2 % EX GEL
CUTANEOUS | Status: AC
  Filled 2017-07-28: qty 10

## 2017-07-28 MED ORDER — TAMSULOSIN HCL 0.4 MG PO CAPS
0.4000 mg | ORAL_CAPSULE | Freq: Every day | ORAL | Status: DC
  Filled 2017-07-28: qty 1

## 2017-07-28 MED ORDER — DEXAMETHASONE SODIUM PHOSPHATE 10 MG/ML IJ SOLN
INTRAMUSCULAR | Status: DC | PRN
  Administered 2017-07-28: 10 mg via INTRAVENOUS

## 2017-07-28 MED ORDER — SODIUM CHLORIDE 0.9 % IV BOLUS
500.0000 mL | Freq: Once | INTRAVENOUS | Status: AC
  Administered 2017-07-28: 500 mL via INTRAVENOUS

## 2017-07-28 MED ORDER — EPHEDRINE SULFATE 50 MG/ML IJ SOLN
INTRAMUSCULAR | Status: DC | PRN
  Administered 2017-07-28 (×4): 10 mg via INTRAVENOUS

## 2017-07-28 MED ORDER — OXYCODONE HCL 5 MG PO TABS: 5.0000 mg | ORAL_TABLET | ORAL | 0 refills | Status: DC | PRN

## 2017-07-28 MED ORDER — DOCUSATE SODIUM 100 MG PO CAPS
100.0000 mg | ORAL_CAPSULE | Freq: Two times a day (BID) | ORAL | Status: DC
Start: 2017-07-28 — End: 2017-07-29

## 2017-07-28 MED ORDER — DIPHENHYDRAMINE HCL 50 MG/ML IJ SOLN: 12.5000 mg | Freq: Four times a day (QID) | INTRAMUSCULAR | Status: DC | PRN

## 2017-07-28 MED ORDER — FENTANYL CITRATE (PF) 100 MCG/2ML IJ SOLN: 25.0000 ug | INTRAMUSCULAR | Status: DC | PRN

## 2017-07-28 MED ORDER — OXYBUTYNIN CHLORIDE 5 MG PO TABS: 5.0000 mg | ORAL_TABLET | Freq: Three times a day (TID) | ORAL | 3 refills | Status: DC | PRN

## 2017-07-28 MED ORDER — OXYBUTYNIN CHLORIDE 5 MG PO TABS
5.0000 mg | ORAL_TABLET | Freq: Three times a day (TID) | ORAL | Status: DC | PRN
  Administered 2017-07-29: 5 mg via ORAL
  Filled 2017-07-28: qty 1

## 2017-07-28 MED ORDER — PROPOFOL 10 MG/ML IV BOLUS
INTRAVENOUS | Status: DC | PRN
  Administered 2017-07-28: 150 mg via INTRAVENOUS
  Administered 2017-07-28: 50 mg via INTRAVENOUS

## 2017-07-28 MED ORDER — MORPHINE SULFATE (PF) 4 MG/ML IV SOLN
4.0000 mg | Freq: Once | INTRAVENOUS | Status: AC
  Administered 2017-07-28: 4 mg via INTRAVENOUS

## 2017-07-28 MED ORDER — LIDOCAINE HCL (CARDIAC) PF 100 MG/5ML IV SOSY
PREFILLED_SYRINGE | INTRAVENOUS | Status: DC | PRN
  Administered 2017-07-28: 100 mg via INTRAVENOUS

## 2017-07-28 MED ORDER — BELLADONNA ALKALOIDS-OPIUM 16.2-60 MG RE SUPP: 1.0000 | Freq: Four times a day (QID) | RECTAL | Status: DC | PRN

## 2017-07-28 MED ORDER — ONDANSETRON HCL 4 MG/2ML IJ SOLN
4.0000 mg | Freq: Once | INTRAMUSCULAR | Status: AC
  Administered 2017-07-28: 4 mg via INTRAVENOUS
  Filled 2017-07-28: qty 2

## 2017-07-28 MED ORDER — FENTANYL CITRATE (PF) 100 MCG/2ML IJ SOLN
INTRAMUSCULAR | Status: DC | PRN
  Administered 2017-07-28 (×2): 50 ug via INTRAVENOUS

## 2017-07-28 MED ORDER — KETOROLAC TROMETHAMINE 30 MG/ML IJ SOLN
15.0000 mg | Freq: Once | INTRAMUSCULAR | Status: AC
  Administered 2017-07-28: 15 mg via INTRAVENOUS
  Filled 2017-07-28: qty 1

## 2017-07-28 MED ORDER — FENTANYL CITRATE (PF) 100 MCG/2ML IJ SOLN
INTRAMUSCULAR | Status: AC
  Filled 2017-07-28: qty 2

## 2017-07-28 MED ORDER — OXYCODONE HCL 5 MG PO TABS
5.0000 mg | ORAL_TABLET | ORAL | Status: DC | PRN
  Administered 2017-07-29: 5 mg via ORAL

## 2017-07-28 MED ORDER — LACTATED RINGERS IV SOLN
INTRAVENOUS | Status: DC | PRN
  Administered 2017-07-28: 22:00:00 via INTRAVENOUS

## 2017-07-28 MED ORDER — ONDANSETRON HCL 4 MG/2ML IJ SOLN: 4.0000 mg | Freq: Once | INTRAMUSCULAR | Status: DC | PRN

## 2017-07-28 MED ORDER — CEFAZOLIN SODIUM-DEXTROSE 2-4 GM/100ML-% IV SOLN
2.0000 g | Freq: Once | INTRAVENOUS | Status: AC
  Administered 2017-07-28: 2 g via INTRAVENOUS
  Filled 2017-07-28: qty 100

## 2017-07-28 MED ORDER — DIPHENHYDRAMINE HCL 12.5 MG/5ML PO ELIX: 12.5000 mg | ORAL_SOLUTION | Freq: Four times a day (QID) | ORAL | Status: DC | PRN

## 2017-07-28 MED ORDER — MIDAZOLAM HCL 2 MG/2ML IJ SOLN
INTRAMUSCULAR | Status: DC | PRN
  Administered 2017-07-28: 2 mg via INTRAVENOUS

## 2017-07-28 MED ORDER — MORPHINE SULFATE (PF) 4 MG/ML IV SOLN
INTRAVENOUS | Status: AC
  Administered 2017-07-28: 4 mg via INTRAVENOUS
  Filled 2017-07-28: qty 1

## 2017-07-28 MED ORDER — SODIUM CHLORIDE 0.9 % IV SOLN: INTRAVENOUS | Status: DC

## 2017-07-28 MED ORDER — PHENYLEPHRINE HCL 10 MG/ML IJ SOLN
INTRAMUSCULAR | Status: DC | PRN
  Administered 2017-07-28 (×2): 100 ug via INTRAVENOUS

## 2017-07-28 MED ORDER — GLYCOPYRROLATE 0.2 MG/ML IJ SOLN
INTRAMUSCULAR | Status: DC | PRN
  Administered 2017-07-28: 0.2 mg via INTRAVENOUS

## 2017-07-28 MED ORDER — ONDANSETRON HCL 4 MG/2ML IJ SOLN: 4.0000 mg | INTRAMUSCULAR | Status: DC | PRN

## 2017-07-28 MED ORDER — MORPHINE SULFATE (PF) 2 MG/ML IV SOLN: 2.0000 mg | INTRAVENOUS | Status: DC | PRN

## 2017-07-28 MED ORDER — ONDANSETRON HCL 4 MG/2ML IJ SOLN
INTRAMUSCULAR | Status: DC | PRN
  Administered 2017-07-28: 4 mg via INTRAVENOUS

## 2017-07-28 MED ORDER — MIDAZOLAM HCL 2 MG/2ML IJ SOLN
INTRAMUSCULAR | Status: AC
  Filled 2017-07-28: qty 2

## 2017-07-28 MED ORDER — TAMSULOSIN HCL 0.4 MG PO CAPS: 0.4000 mg | ORAL_CAPSULE | Freq: Every day | ORAL | 3 refills | Status: DC

## 2017-07-28 SURGICAL SUPPLY — 31 items
BAG DRAIN CYSTO-URO LG1000N (MISCELLANEOUS) ×3 IMPLANT
BASKET ZERO TIP 1.9FR (BASKET) ×3 IMPLANT
BRUSH SCRUB EZ  4% CHG (MISCELLANEOUS)
BRUSH SCRUB EZ 1% IODOPHOR (MISCELLANEOUS) IMPLANT
BRUSH SCRUB EZ 4% CHG (MISCELLANEOUS) IMPLANT
CATH URETL 5X70 OPEN END (CATHETERS) ×3 IMPLANT
CNTNR SPEC 2.5X3XGRAD LEK (MISCELLANEOUS) ×1
CONRAY 43 FOR UROLOGY 50M (MISCELLANEOUS) ×3 IMPLANT
CONT SPEC 4OZ STER OR WHT (MISCELLANEOUS) ×2
CONTAINER SPEC 2.5X3XGRAD LEK (MISCELLANEOUS) ×1 IMPLANT
DRAPE UTILITY 15X26 TOWEL STRL (DRAPES) ×3 IMPLANT
FIBER LASER LITHO 273 (Laser) ×3 IMPLANT
GLOVE BIO SURGEON STRL SZ 6.5 (GLOVE) ×4 IMPLANT
GLOVE BIO SURGEONS STRL SZ 6.5 (GLOVE) ×2
GOWN STRL REUS W/ TWL LRG LVL3 (GOWN DISPOSABLE) ×2 IMPLANT
GOWN STRL REUS W/TWL LRG LVL3 (GOWN DISPOSABLE) ×4
GUIDEWIRE GREEN .038 145CM (MISCELLANEOUS) ×3 IMPLANT
INFUSOR MANOMETER BAG 3000ML (MISCELLANEOUS) ×3 IMPLANT
INTRODUCER DILATOR DOUBLE (INTRODUCER) IMPLANT
KIT TURNOVER CYSTO (KITS) ×3 IMPLANT
PACK CYSTO AR (MISCELLANEOUS) ×3 IMPLANT
SENSORWIRE 0.038 NOT ANGLED (WIRE) ×3
SET CYSTO W/LG BORE CLAMP LF (SET/KITS/TRAYS/PACK) ×3 IMPLANT
SHEATH URETERAL 12FRX35CM (MISCELLANEOUS) IMPLANT
SOL .9 NS 3000ML IRR  AL (IV SOLUTION) ×2
SOL .9 NS 3000ML IRR UROMATIC (IV SOLUTION) ×1 IMPLANT
STENT URET 6FRX24 CONTOUR (STENTS) IMPLANT
STENT URET 6FRX26 CONTOUR (STENTS) ×6 IMPLANT
SURGILUBE 2OZ TUBE FLIPTOP (MISCELLANEOUS) ×3 IMPLANT
WATER STERILE IRR 1000ML POUR (IV SOLUTION) ×3 IMPLANT
WIRE SENSOR 0.038 NOT ANGLED (WIRE) ×1 IMPLANT

## 2017-07-28 NOTE — ED Notes (Signed)
Patient is being taken to PACU, by Willow Ora, EDT per [facility] request

## 2017-07-28 NOTE — Transfer of Care (Signed)
Immediate Anesthesia Transfer of Care Note  Patient: Bobby Flowers  Procedure(s) Performed: CYSTOSCOPY WITH URETEROSCOPY (Bilateral Ureter) CYSTOSCOPY WITH STENT PLACEMENT (Bilateral Ureter) CYSTOSCOPY WITH HOLMIUM LASER LITHOTRIPSY (Bilateral Ureter)  Patient Location: PACU  Anesthesia Type:General  Level of Consciousness: sedated  Airway & Oxygen Therapy: Patient connected to face mask oxygen  Post-op Assessment: Post -op Vital signs reviewed and stable  Post vital signs: stable  Last Vitals:  Vitals Value Taken Time  BP 112/75 07/28/2017 11:14 PM  Temp 36.4 C 07/28/2017 11:13 PM  Pulse 63 07/28/2017 11:14 PM  Resp 13 07/28/2017 11:14 PM  SpO2 100 % 07/28/2017 11:14 PM  Vitals shown include unvalidated device data.  Last Pain:  Vitals:   07/28/17 2313  TempSrc: Temporal  PainSc:          Complications: No apparent anesthesia complications

## 2017-07-28 NOTE — H&P (Signed)
H&P Physician requesting consult: Charlotte Crumb  Chief Complaint: Bilateral ureteral calculi  History of Present Illness: 67 year old male presented with acute right-sided flank pain.  He was found to have mildly elevated creatinine.  CT scan was performed which showed bilateral distal ureteral calculi.  The patient is currently comfortable.  He denies any fever, chill, nausea, vomiting.  He denies dysuria.  He has mild leukocytosis of 14 but urinalysis is negative.  Past Medical History:  Diagnosis Date  . Chest tightness    a. 05/2017 Cath: nl cors.  . Headache   . Hypothyroidism   . Mitral valve prolapse    a. 09/2015 Echo: EF 55-60%, no rwma, nl LA size, nl RV fxn, nl PASP.  [name]   . PAF (paroxysmal atrial fibrillation) ([place])    a. Dx in 1980's; b. CHA2DS2VASc = 1 - briefly on coumadin many years ago-->caused rash; c. Chronic Amio 100mg  QD.   Past Surgical History:  Procedure Laterality Date  . COLONOSCOPY  04/25/05  . COLONOSCOPY WITH PROPOFOL N/A 07/22/2015   Procedure: COLONOSCOPY WITH PROPOFOL;  Surgeon: Robert Bellow, MD;  Location: [facility] ENDOSCOPY;  Service: Endoscopy;  Laterality: N/A;  . HEMORROIDECTOMY    . HERNIA REPAIR Left May 22, 2015   Left inguinal hernia repair with medium Ultra Pro mesh  . INGUINAL HERNIA REPAIR Left 07/22/2015   Procedure: HERNIA REPAIR INGUINAL ADULT;  Surgeon: Robert Bellow, MD;  Location: [facility] ORS;  Service: General;  Laterality: Left;  . LEFT HEART CATH AND CORONARY ANGIOGRAPHY Left 05/29/2017   Procedure: LEFT HEART CATH AND CORONARY ANGIOGRAPHY;  Surgeon: [name], MD;  Location: [place] CV LAB;  Service: Cardiovascular;  Laterality: Left;  . NASAL SINUS SURGERY    . VASECTOMY      Home Medications:  Medications Prior to Admission  Medication Sig Dispense Refill Last Dose  . amiodarone (PACERONE) 200 MG tablet Take 1 tablet (200 mg total) by mouth daily. 90 tablet 3 unknown at unknown  . aspirin EC 81 MG  tablet Take 1 tablet (81 mg total) by mouth daily. 90 tablet 3 unknown at unknown  . atenolol (TENORMIN) 100 MG tablet TAKE 1 TABLET (100 MG TOTAL) BY MOUTH DAILY. 90 tablet 3 unknown at unknown  . latanoprost (XALATAN) 0.005 % ophthalmic solution Place 1 drop into both eyes at bedtime.    unknown at unknown  . levothyroxine (SYNTHROID, LEVOTHROID) 75 MCG tablet Take 75 mcg by mouth daily before breakfast. Reported on 08/05/2015   unknown at unknown  . SUMAtriptan (IMITREX) 100 MG tablet Take 100 mg by mouth as needed for migraine.    prn at prn   Allergies:  Allergies  Allergen Reactions  . Coumadin [Warfarin] Rash    Family History  Problem Relation Age of Onset  . Heart failure Mother   . Leukemia Father   . Alcoholism Brother    Social History:  reports that he quit smoking about 17 years ago. His smoking use included cigarettes. He has a 46.50 pack-year smoking history. He has never used smokeless tobacco. He reports that he does not drink alcohol or use drugs.  ROS: A complete review of systems was performed.  All systems are negative except for pertinent findings as noted. ROS   Physical Exam:  Vital signs in last 24 hours: Temp:  [98.5 F (36.9 C)] 98.5 F (36.9 C) (06/07 1759) Pulse Rate:  [55-70] 60 (06/07 2131) Resp:  [15-18] 18 (06/07 2131) BP: (126-148)/(80-89) 128/80 (  06/07 2131) SpO2:  [97 %-100 %] 99 % (06/07 2131) Weight:  [80.7 kg (178 lb)] 80.7 kg (178 lb) (06/07 1759) General:  Alert and oriented, No acute distress HEENT: Normocephalic, atraumatic Neck: No JVD or lymphadenopathy Cardiovascular: Regular rate and rhythm Lungs: Regular rate and effort Abdomen: Soft, nontender, nondistended, no abdominal masses Back: No CVA tenderness Extremities: No edema Neurologic: Grossly intact  Laboratory Data:  Results for orders placed or performed during the hospital encounter of 07/28/17 (from the past 24 hour(s))  Comprehensive metabolic panel     Status:  Abnormal   Collection Time: 07/28/17  6:01 PM  Result Value Ref Range   Sodium 132 (L) 135 - 145 mmol/L   Potassium 4.1 3.5 - 5.1 mmol/L   Chloride 99 (L) 101 - 111 mmol/L   CO2 24 22 - 32 mmol/L   Glucose, Bld 92 65 - 99 mg/dL   BUN 26 (H) 6 - 20 mg/dL   Creatinine, Ser 1.27 (H) 0.61 - 1.24 mg/dL   Calcium 8.5 (L) 8.9 - 10.3 mg/dL   Total Protein 6.8 6.5 - 8.1 g/dL   Albumin 4.1 3.5 - 5.0 g/dL   AST 20 15 - 41 U/L   ALT 15 (L) 17 - 63 U/L   Alkaline Phosphatase 69 38 - 126 U/L   Total Bilirubin 1.6 (H) 0.3 - 1.2 mg/dL   GFR calc non Af Amer 58 (L) >60 mL/min   GFR calc Af Amer >60 >60 mL/min   Anion gap 9 5 - 15  Urinalysis, Complete w Microscopic     Status: Abnormal   Collection Time: 07/28/17  6:01 PM  Result Value Ref Range   Color, Urine STRAW (A) YELLOW   APPearance CLEAR (A) CLEAR   Specific Gravity, Urine 1.014 1.005 - 1.030   pH 6.0 5.0 - 8.0   Glucose, UA NEGATIVE NEGATIVE mg/dL   Hgb urine dipstick SMALL (A) NEGATIVE   Bilirubin Urine NEGATIVE NEGATIVE   Ketones, ur 20 (A) NEGATIVE mg/dL   Protein, ur NEGATIVE NEGATIVE mg/dL   Nitrite NEGATIVE NEGATIVE   Leukocytes, UA NEGATIVE NEGATIVE   RBC / HPF 11-20 0 - 5 RBC/hpf   WBC, UA 0-5 0 - 5 WBC/hpf   Bacteria, UA NONE SEEN NONE SEEN   Squamous Epithelial / LPF NONE SEEN 0 - 5   Mucus PRESENT   CBC with Differential     Status: Abnormal   Collection Time: 07/28/17  6:01 PM  Result Value Ref Range   WBC 14.3 (H) 3.8 - 10.6 K/uL   RBC 5.26 4.40 - 5.90 MIL/uL   Hemoglobin 15.9 13.0 - 18.0 g/dL   HCT 46.4 40.0 - 52.0 %   MCV 88.2 80.0 - 100.0 fL   MCH 30.2 26.0 - 34.0 pg   MCHC 34.2 32.0 - 36.0 g/dL   RDW 13.8 11.5 - 14.5 %   Platelets 177 150 - 440 K/uL   Neutrophils Relative % 88 %   Neutro Abs 12.4 (H) 1.4 - 6.5 K/uL   Lymphocytes Relative 6 %   Lymphs Abs 0.9 (L) 1.0 - 3.6 K/uL   Monocytes Relative 6 %   Monocytes Absolute 0.9 0.2 - 1.0 K/uL   Eosinophils Relative 0 %   Eosinophils Absolute 0.0 0 -  0.7 K/uL   Basophils Relative 0 %   Basophils Absolute 0.1 0 - 0.1 K/uL   No results found for this or any previous visit (from the past 240 hour(s)). Creatinine: Recent Labs  07/28/17 1801  CREATININE 1.27*   CT scan personally reviewed.  He has bilateral distal ureteral calculi.  Small left lower pole nonobstructive calculus as well.  Right kidney has significant perinephric stranding possibly consistent with recent forniceal rupture.  He has hydronephrosis on the right.  No hydronephrosis on the left.  Impression/Assessment:  Bilateral distal ureteral calculi Acute renal insufficiency  Plan:  Plan for urgent bilateral ureteroscopy with laser lithotripsy and ureteral stent placement.  He understands potential risk including but not limited to bleeding, infection, injury to surrounding structures including the potential for ureteral avulsion which is rare.  He is eager to proceed.  Marton Redwood, III 07/28/2017, 9:55 PM

## 2017-07-28 NOTE — ED Triage Notes (Signed)
Pt c/o flank pain that started this afternoon - he reports that he is having difficulty voiding but that it is not pain

## 2017-07-28 NOTE — ED Notes (Signed)
Surgeon at bedside.  

## 2017-07-28 NOTE — ED Notes (Signed)
Patient is in hospital gown only, Orderly to come for patient.

## 2017-07-28 NOTE — Anesthesia Post-op Follow-up Note (Signed)
Anesthesia QCDR form completed.        

## 2017-07-28 NOTE — ED Notes (Signed)
First Nurse Note: Pt states that he is having right flank pain that radiates into his lower right abdomen. Pt is in NAD at this time.

## 2017-07-28 NOTE — Op Note (Signed)
Operative Note  Preoperative diagnosis:  1.  Bilateral ureteral calculi  Postoperative diagnosis: 1.  Bilateral ureteral calculi  Procedure(s): 1.  Cystoscopy with bilateral retrograde pyelogram with interpretation, bilateral ureteroscopy with laser lithotripsy and stone basketing, bilateral ureteral stent placement, fluoroscopy less than 1 hour  Surgeon: Link Snuffer, MD  Assistants: None  Anesthesia: General  Complications: None immediate  EBL: Minimal  Specimens: 1.  Renal calculi  Drains/Catheters: 1.  Bilateral 6 x 26 double-J ureteral stents  Intraoperative findings: Bilateral distal ureteral calculi.  Bladder and urethra were normal.  Left retrograde pyelogram revealed a well opacified kidney.  There was no obvious filling defects.  Right retrograde pyelogram revealed mild hydronephrosis with no significant filling defect in the kidney.  Indication: 67 year old male presented with acute right-sided flank pain.  CT was performed which revealed bilateral distal ureteral calculi.  He was therefore taken urgently to the operating room for the previously mentioned operation.  Description of procedure:  The patient was identified and consent was obtained.  The patient was taken to the operating room and placed in the supine position.  The patient was placed under general anesthesia.  Perioperative antibiotics were administered.  The patient was placed in dorsal lithotomy.  Patient was prepped and draped in a standard sterile fashion and a timeout was performed.  A 21 French rigid cystoscope was advanced into the urethra and into the bladder.  Complete cystoscopy was performed with no abnormal findings.  The left ureter was cannulated with a sensor wire which was advanced to the kidney under fluoroscopic guidance.  A semirigid ureteroscope was advanced alongside the wire to the stones of interest on the left.  There was one larger stone and one smaller stone.  These were both  fragmented to smaller fragments followed by basket extraction.  These were collected for specimen.  I then laser fragmented 1 of the fragment to set less than 1 mm fragments.  I inspected the entire ureter up to the level of the ureteropelvic junction.  There were no other ureteral calculi seen.  There is no ureteral injury seen.  I shot a retrograde pyelogram through the scope with the findings noted above.  I then withdrew the scope and visualize the entire ureter upon removal and again no significant calculi or injury was seen.  I backloaded the wire onto a cystoscope which was advanced into the bladder.  I then used a 6 x 26 double-J ureteral stent in a standard fashion followed by removal of the wire.  Fluoroscopy confirmed proximal placement and direct visualization confirmed a good coil within the bladder.  I drained the bladder and then cannulated the right ureter with a sensor wire which was advanced up to the kidney under fluoroscopic guidance.  I advanced a semirigid ureteroscope alongside the wire up to the stone of interest.  There was a single stone on this side.  I laser fragmented this to less than 1 mm fragments.  I then advanced the scope up to the renal pelvis and no other calculi were seen.  I shot a retrograde diagram through the scope with the findings noted above.  I then withdrew the scope and visualize the entire ureter upon removal.  There were no clinically significant calculi and no ureteral injury was identified.  I backloaded the wire onto a cystoscope and advanced that into the bladder.  I placed a 6 x 26 double-J ureteral stent in a standard fashion followed by removal of the wire.  Fluoroscopy confirmed  proximal placement and direct visualization confirmed a good coil within the bladder.  The bladder was drained and the scope withdrawn.  Patient tolerated the procedure well and was stable postoperatively.  Plan: Follow-up in 1 to 2 weeks for ureteral stent removal.

## 2017-07-28 NOTE — Discharge Instructions (Addendum)
[facility] °[phone] °Post Ureteroscopy With or Without Stent Instructions ° °Definitions: ° °Ureter: The duct that transports urine from the kidney to the bladder. °Stent:   A plastic hollow tube that is placed into the ureter, from the kidney to the                 bladder to prevent the ureter from swelling shut. ° °GENERAL INSTRUCTIONS: ° °Despite the fact that no skin incisions were used, the area around the ureter and bladder is raw and irritated. The stent is a foreign body which will further irritate the bladder wall. This irritation is manifested by increased frequency of urination, both day and night, and by an increase in the urge to urinate. In some, the urge to urinate is present almost always. Sometimes the urge is strong enough that you may not be able to stop yourself from urinating. The only real cure is to remove the stent and then give time for the bladder wall to heal which can't be done until the danger of the ureter swelling shut has passed, which varies. ° °You may see some blood in your urine while the stent is in place and a few days afterwards. Do not be alarmed, even if the urine was clear for a while. Get off your feet and drink lots of fluids until clearing occurs. If you start to pass clots or don't improve, call us. ° °DIET: °You may return to your normal diet immediately. Because of the raw surface of your bladder, alcohol, spicy foods, acid type foods and drinks with caffeine may cause irritation or frequency and should be used in moderation. To keep your urine flowing freely and to avoid constipation, drink plenty of fluids during the day ( 8-10 glasses ). °Tip: Avoid cranberry juice because it is very acidic. ° °ACTIVITY: °Your physical activity doesn't need to be restricted. However, if you are very active, you may see some blood in your urine. We suggest that you reduce your activity under these circumstances until the bleeding has stopped. ° °BOWELS: °It is  important to keep your bowels regular during the postoperative period. Straining with bowel movements can cause bleeding. A bowel movement every other day is reasonable. Use a mild laxative if needed, such as Milk of Magnesia 2-3 tablespoons, or 2 Dulcolax tablets. Call if you continue to have problems. If you have been taking narcotics for pain, before, during or after your surgery, you may be constipated. Take a laxative if necessary. ° ° °MEDICATION: °You should resume your pre-surgery medications unless told not to. °You may take oxybutynin or flomax if prescribed for bladder spasms or discomfort from the stent °Take pain medication as directed for pain refractory to conservative management ° °PROBLEMS YOU SHOULD REPORT TO US: °· Fevers over 100.5 Fahrenheit. °· Heavy bleeding, or clots ( See above notes about blood in urine ). °· Inability to urinate. °· Drug reactions ( hives, rash, nausea, vomiting, diarrhea ). °· Severe burning or pain with urination that is not improving. ° °AMBULATORY SURGERY  °DISCHARGE INSTRUCTIONS ° ° °1) The drugs that you were given will stay in your system until tomorrow so for the next 24 hours you should not: ° °A) Drive an automobile °B) Make any legal decisions °C) Drink any alcoholic beverage ° ° °2) You may resume regular meals tomorrow.  Today it is better to start with liquids and gradually work up to solid foods. ° °You may eat anything you prefer, but   it is better to start with liquids, then soup and crackers, and gradually work up to solid foods. ° ° °3) Please notify your doctor immediately if you have any unusual bleeding, trouble breathing, redness and pain at the surgery site, drainage, fever, or pain not relieved by medication. ° ° ° °4) Additional Instructions: ° ° ° °Please contact your physician with any problems or Same Day Surgery at [phone], Monday through Friday 6 am to 4 pm, or [place] [facility] [place] Main number at [phone]. ° °

## 2017-07-28 NOTE — Anesthesia Preprocedure Evaluation (Signed)
Anesthesia Evaluation  Patient identified by MRN, date of birth, ID band Patient awake    Reviewed: Allergy & Precautions, H&P , NPO status , Patient's Chart, lab work & pertinent test results  History of Anesthesia Complications Negative for: history of anesthetic complications  Airway Mallampati: III  TM Distance: <3 FB Neck ROM: limited    Dental  (+) Poor Dentition, Chipped, Caps   Pulmonary neg shortness of breath, former smoker,    Pulmonary exam normal breath sounds clear to auscultation       Cardiovascular Exercise Tolerance: Good (-) angina(-) Past MI and (-) DOE Normal cardiovascular exam+ dysrhythmias Atrial Fibrillation + Valvular Problems/Murmurs MVP  Rhythm:regular Rate:Normal     Neuro/Psych  Headaches, negative psych ROS   GI/Hepatic negative GI ROS, Neg liver ROS,   Endo/Other  Hypothyroidism   Renal/GU Renal disease  negative genitourinary   Musculoskeletal   Abdominal   Peds  Hematology negative hematology ROS (+)   Anesthesia Other Findings Past Medical History:   Mitral valve prolapse                                          Comment:With mild regurgitation   History of kidney stones                                     Hypothyroid                                                  Persistent atrial fibrillation (HCC)                         Thyroid disease                                              Chronic kidney disease                                         Comment:H/O KIDNEY STONE   Headache                                                     Dysrhythmia                                                    Comment:A-FIB  Past Surgical History:   NASAL SINUS SURGERY                                           HEMORROIDECTOMY  COLONOSCOPY                                      04/25/05       VASECTOMY                                                     BMI    Body Mass Index   24.53 kg/m 2      Reproductive/Obstetrics negative OB ROS                             Anesthesia Physical  Anesthesia Plan  ASA: III and emergent  Anesthesia Plan: General   Post-op Pain Management:    Induction: Intravenous  PONV Risk Score and Plan: 2 and Ondansetron and Dexamethasone  Airway Management Planned: LMA  Additional Equipment:   Intra-op Plan:   Post-operative Plan: Extubation in OR  Informed Consent: I have reviewed the patients History and Physical, chart, labs and discussed the procedure including the risks, benefits and alternatives for the proposed anesthesia with the patient or authorized representative who has indicated his/her understanding and acceptance.   Dental Advisory Given  Plan Discussed with: Anesthesiologist, CRNA and Surgeon  Anesthesia Plan Comments:         Anesthesia Quick Evaluation

## 2017-07-28 NOTE — ED Provider Notes (Addendum)
[facility] Emergency Department Provider Note  ____________________________________________   I have reviewed the triage vital signs and the nursing notes. Where available I have reviewed prior notes and, if possible and indicated, outside hospital notes.    HISTORY  Chief Complaint Flank Pain    HPI Bobby Flowers is a 67 y.o. male  Who is never been officially diagnosed with kidney stones with feels that he has had a before, presents today with right flank pain which began suddenly this afternoon, radiates towards the groin a little bit, nothing is better nothing makes it worse, he has not tried much to control.  He has had this for times in the past.  Not any associated dysuria numbness or weakness does not feel that his extremities are cold or on perfused, no trauma, no incontinence of bowel or bladder.  Significant discomfort is requesting pain medications.  No vomiting or other associated symptoms.      Past Medical History:  Diagnosis Date  . Chest tightness    a. 05/2017 Cath: nl cors.  . Headache   . Hypothyroidism   . Mitral valve prolapse    a. 09/2015 Echo: EF 55-60%, no rwma, nl LA size, nl RV fxn, nl PASP.  [name]   . PAF (paroxysmal atrial fibrillation) ([place])    a. Dx in 1980's; b. CHA2DS2VASc = 1 - briefly on coumadin many years ago-->caused rash; c. Chronic Amio 100mg  QD.    Patient Active Problem List   Diagnosis Date Noted  . Unstable angina ([place])   . Left inguinal hernia 07/11/2015  . Encounter for screening colonoscopy 07/01/2015  . Avulsion fracture 03/15/2015  . Tendonitis 03/15/2015  . Persistent atrial fibrillation ([place])   . Mitral valve prolapse     Past Surgical History:  Procedure Laterality Date  . COLONOSCOPY  04/25/05  . COLONOSCOPY WITH PROPOFOL N/A 07/22/2015   Procedure: COLONOSCOPY WITH PROPOFOL;  Surgeon: Robert Bellow, MD;  Location: [facility] ENDOSCOPY;  Service: Endoscopy;  Laterality: N/A;  .  HEMORROIDECTOMY    . HERNIA REPAIR Left May 22, 2015   Left inguinal hernia repair with medium Ultra Pro mesh  . INGUINAL HERNIA REPAIR Left 07/22/2015   Procedure: HERNIA REPAIR INGUINAL ADULT;  Surgeon: Robert Bellow, MD;  Location: [facility] ORS;  Service: General;  Laterality: Left;  . LEFT HEART CATH AND CORONARY ANGIOGRAPHY Left 05/29/2017   Procedure: LEFT HEART CATH AND CORONARY ANGIOGRAPHY;  Surgeon: [name], MD;  Location: [facility];  Service: Cardiovascular;  Laterality: Left;  . NASAL SINUS SURGERY    . VASECTOMY      Prior to Admission medications   Medication Sig Start Date End Date Taking? Authorizing Provider  amiodarone (PACERONE) 200 MG tablet Take 1 tablet (200 mg total) by mouth daily. 06/01/17   Theora Gianotti, NP  aspirin EC 81 MG tablet Take 1 tablet (81 mg total) by mouth daily. 05/23/17   [name], MD  atenolol (TENORMIN) 100 MG tablet TAKE 1 TABLET (100 MG TOTAL) BY MOUTH DAILY. 05/15/17   [name], MD  latanoprost (XALATAN) 0.005 % ophthalmic solution Place 1 drop into both eyes at bedtime.  04/06/16   [provider]  levothyroxine (SYNTHROID, LEVOTHROID) 75 MCG tablet Take 75 mcg by mouth daily before breakfast. Reported on 08/05/2015 05/05/13   [provider]  SUMAtriptan (IMITREX) 100 MG tablet Take 100 mg by mouth as needed for migraine.  01/18/16   [provider]    Allergies Coumadin [warfarin]  Family History  Problem Relation Age of Onset  . Heart failure Mother   . Leukemia Father   . Alcoholism Brother     Social History Social History   Tobacco Use  . Smoking status: Former Smoker    Packs/day: 1.50    Years: 31.00    Pack years: 46.50    Types: Cigarettes    Last attempt to quit: 07/14/2000    Years since quitting: 17.0  . Smokeless tobacco: Never Used  Substance Use Topics  . Alcohol use: No  . Drug use: No    Review of Systems Constitutional: No  fever/chills Eyes: No visual changes. ENT: No sore throat. No stiff neck no neck pain Cardiovascular: Denies chest pain. Respiratory: Denies shortness of breath. Gastrointestinal:   no vomiting.  No diarrhea.  No constipation. Genitourinary: Negative for dysuria. Musculoskeletal: Negative lower extremity swelling Skin: Negative for rash. Neurological: Negative for severe headaches, focal weakness or numbness.   ____________________________________________   PHYSICAL EXAM:  VITAL SIGNS: ED Triage Vitals  Enc Vitals Group     BP 07/28/17 1759 (!) 144/80     Pulse Rate 07/28/17 1759 70     Resp 07/28/17 1759 15     Temp 07/28/17 1759 98.5 F (36.9 C)     Temp Source 07/28/17 1759 Oral     SpO2 07/28/17 1759 97 %     Weight 07/28/17 1759 178 lb (80.7 kg)     Height 07/28/17 1759 5\' 9"  (1.753 m)     Head Circumference --      Peak Flow --      Pain Score 07/28/17 1758 9     Pain Loc --      Pain Edu? --      Excl. in [place]? --     Constitutional: Alert and oriented. Well appearing and in no acute distress. Eyes: Conjunctivae are normal Head: Atraumatic HEENT: No congestion/rhinnorhea. Mucous membranes are moist.  Oropharynx non-erythematous Neck:   Nontender with no meningismus, no masses, no stridor Cardiovascular: Normal rate, regular rhythm. Grossly normal heart sounds.  Good peripheral circulation. Respiratory: Normal respiratory effort.  No retractions. Lungs CTAB. Abdominal: Soft and nontender. No distention. No guarding no rebound Back:  There is no focal tenderness or step off.  there is no midline tenderness there are no lesions noted. there is right-sided positive CVA tenderness Normal external genitalia nontender, strong pulses in the inguinal region, Musculoskeletal: No lower extremity tenderness, no upper extremity tenderness. No joint effusions, no DVT signs strong distal pulses no edema Neurologic:  Normal speech and language. No gross focal neurologic deficits  are appreciated.  Skin:  Skin is warm, dry and intact. No rash noted. Psychiatric: Mood and affect are normal. Speech and behavior are normal.  ____________________________________________   LABS (all labs ordered are listed, but only abnormal results are displayed)  Labs Reviewed  COMPREHENSIVE METABOLIC PANEL - Abnormal; Notable for the following components:      Result Value   Sodium 132 (*)    Chloride 99 (*)    BUN 26 (*)    Creatinine, Ser 1.27 (*)    Calcium 8.5 (*)    ALT 15 (*)    Total Bilirubin 1.6 (*)    GFR calc non Af Amer 58 (*)    All other components within normal limits  URINALYSIS, COMPLETE (UACMP) WITH MICROSCOPIC - Abnormal; Notable for the following components:   Color, Urine STRAW (*)  APPearance CLEAR (*)    Hgb urine dipstick SMALL (*)    Ketones, ur 20 (*)    All other components within normal limits  CBC WITH DIFFERENTIAL/PLATELET - Abnormal; Notable for the following components:   WBC 14.3 (*)    Neutro Abs 12.4 (*)    Lymphs Abs 0.9 (*)    All other components within normal limits    Pertinent labs  results that were available during my care of the patient were reviewed by me and considered in my medical decision making (see chart for details). ____________________________________________  EKG  I personally interpreted any EKGs ordered by me or triage  ____________________________________________  RADIOLOGY  Pertinent labs & imaging results that were available during my care of the patient were reviewed by me and considered in my medical decision making (see chart for details). If possible, patient and/or family made aware of any abnormal findings.  No results found. ____________________________________________    PROCEDURES  Procedure(s) performed: None  Procedures  Critical Care performed: None  ____________________________________________   INITIAL IMPRESSION / ASSESSMENT AND PLAN / ED COURSE  Pertinent labs & imaging  results that were available during my care of the patient were reviewed by me and considered in my medical decision making (see chart for details).  She with hematuria, sudden onset flank pain, history of what he believes to recurrent stones although never verified by CT he states.  All symptoms are consistent with that however given we do not have imaging to support this diagnosis yet will obtain CT scan.  I will also given Toradol.  Usually I wait but at this time, very clear history and physical exam patient very uncomfortable and he is I believe driving would prefer not to have anything that limits his ability to get home.  We will start him with pain medications, CT scan urine does not show any evidence of infection or pyelonephritis, and I do not palpate a AAA he is got excellent pulses nothing to suggest dissection either  ----------------------------------------- 8:19 PM on 07/28/2017 -----------------------------------------  Still uncomfortable we will give him morphine I discussed with Dr. Gloriann Loan, of urology, they agree with management will complete stent given significant obstructing stone on the right and present and likely chronic stones on the left.  Patient understands and we will keep him n.p.o.    ____________________________________________   FINAL CLINICAL IMPRESSION(S) / ED DIAGNOSES  Final diagnoses:  None      This chart was dictated using voice recognition software.  Despite best efforts to proofread,  errors can occur which can change meaning.      Schuyler Amor, MD 07/28/17 1940    Schuyler Amor, MD 07/28/17 2019

## 2017-07-29 DIAGNOSIS — N132 Hydronephrosis with renal and ureteral calculous obstruction: Secondary | ICD-10-CM

## 2017-07-29 DIAGNOSIS — N179 Acute kidney failure, unspecified: Secondary | ICD-10-CM

## 2017-07-29 MED ORDER — OXYBUTYNIN CHLORIDE 5 MG PO TABS
ORAL_TABLET | ORAL | Status: AC
  Filled 2017-07-29: qty 1

## 2017-07-29 MED ORDER — OXYCODONE HCL 5 MG PO TABS
ORAL_TABLET | ORAL | Status: AC
  Administered 2017-07-29: 5 mg via ORAL
  Filled 2017-07-29: qty 1

## 2017-07-29 NOTE — Discharge Summary (Signed)
Physician Discharge Summary  Patient ID: GARLAN DREWES MRN: [id number] DOB/AGE: Jan 02, 1951 67 y.o.  Admit date: 07/28/2017 Discharge date: 07/29/2017  Admission Diagnoses:  Discharge Diagnoses:  Active Problems:   Ureteral calculus   Discharged Condition: good  Hospital Course: Patient was admitted to the hospital with bilateral ureteral calculi.  He went to the operating room for cystoscopy with bilateral ureteroscopy, laser lithotripsy, ureteral stent placement.  He tolerated the procedure well and was stable postoperatively.  Consults: None  Significant Diagnostic Studies: None  Treatments: surgery: As above  Discharge Exam: Blood pressure (!) 153/90, pulse 72, temperature (!) 97.4 F (36.3 C), resp. rate 18, height 5\' 9"  (1.753 m), weight 80.7 kg (178 lb), SpO2 97 %. General appearance: alert no acute distress Adequate peripheral perfusion of extremities Abdomen soft nontender nondistended   Disposition:    Allergies as of 07/29/2017      Reactions   Coumadin [warfarin] Rash      Medication List    TAKE these medications   amiodarone 200 MG tablet Commonly known as:  PACERONE Take 1 tablet (200 mg total) by mouth daily.   aspirin EC 81 MG tablet Take 1 tablet (81 mg total) by mouth daily.   atenolol 100 MG tablet Commonly known as:  TENORMIN TAKE 1 TABLET (100 MG TOTAL) BY MOUTH DAILY.   latanoprost 0.005 % ophthalmic solution Commonly known as:  XALATAN Place 1 drop into both eyes at bedtime.   levothyroxine 75 MCG tablet Commonly known as:  SYNTHROID, LEVOTHROID Take 75 mcg by mouth daily before breakfast. Reported on 08/05/2015   oxybutynin 5 MG tablet Commonly known as:  DITROPAN Take 1 tablet (5 mg total) by mouth every 8 (eight) hours as needed for bladder spasms.   oxyCODONE 5 MG immediate release tablet Commonly known as:  Oxy IR/ROXICODONE Take 1 tablet (5 mg total) by mouth every 4 (four) hours as needed for moderate pain.   SUMAtriptan 100  MG tablet Commonly known as:  IMITREX Take 100 mg by mouth as needed for migraine.   tamsulosin 0.4 MG Caps capsule Commonly known as:  FLOMAX Take 1 capsule (0.4 mg total) by mouth daily.      Follow-up Information    Lucas Mallow, MD Follow up.   Specialty:  Urology Why:  as instructed by Dr. Lucita Ferrara information: [place] [id number] [phone]           Signed: Marton Redwood, III 07/29/2017, 4:10 PM

## 2017-07-31 ENCOUNTER — Telehealth: Payer: Self-pay | Admitting: Urology

## 2017-07-31 ENCOUNTER — Encounter: Payer: Self-pay | Admitting: Urology

## 2017-07-31 NOTE — Telephone Encounter (Signed)
App made pt is aware  Bobby Flowers

## 2017-07-31 NOTE — Telephone Encounter (Signed)
-----   Message from Lucas Mallow, MD sent at 07/28/2017 11:28 PM EDT ----- Please arrange for follow-up with a provider in 1 week for cystoscopy with bilateral ureteral stent removal

## 2017-07-31 NOTE — Anesthesia Postprocedure Evaluation (Signed)
Anesthesia Post Note  Patient: Bobby Flowers  Procedure(s) Performed: CYSTOSCOPY WITH URETEROSCOPY (Bilateral Ureter) CYSTOSCOPY WITH STENT PLACEMENT (Bilateral Ureter) CYSTOSCOPY WITH HOLMIUM LASER LITHOTRIPSY (Bilateral Ureter)  Patient location during evaluation: PACU Anesthesia Type: General Level of consciousness: awake and alert Pain management: pain level controlled Vital Signs Assessment: post-procedure vital signs reviewed and stable Respiratory status: spontaneous breathing, nonlabored ventilation, respiratory function stable and patient connected to nasal cannula oxygen Cardiovascular status: blood pressure returned to baseline and stable Postop Assessment: no apparent nausea or vomiting Anesthetic complications: no     Last Vitals:  Vitals:   07/28/17 2358 07/29/17 0038  BP: (!) 144/77 (!) 153/90  Pulse: 68 72  Resp: 17 18  Temp: (!) 36.3 C   SpO2: 97% 97%    Last Pain:  Vitals:   07/29/17 0038  TempSrc:   PainSc: 2                  Martha Clan

## 2017-08-03 LAB — STONE ANALYSIS
CA OXALATE, DIHYDRATE: 3 %
Ca Oxalate,Monohydr.: 87 %
Ca phos cry stone ql IR: 10 %
Stone Weight KSTONE: 183.6 mg

## 2017-08-10 ENCOUNTER — Encounter: Payer: Self-pay | Admitting: Urology

## 2017-08-10 ENCOUNTER — Ambulatory Visit (INDEPENDENT_AMBULATORY_CARE_PROVIDER_SITE_OTHER): Payer: BLUE CROSS/BLUE SHIELD | Admitting: Urology

## 2017-08-10 VITALS — BP 121/81 | HR 57 | Ht 69.0 in | Wt 170.0 lb

## 2017-08-10 DIAGNOSIS — N201 Calculus of ureter: Secondary | ICD-10-CM | POA: Diagnosis not present

## 2017-08-10 LAB — MICROSCOPIC EXAMINATION: Epithelial Cells (non renal): NONE SEEN /hpf (ref 0–10)

## 2017-08-10 LAB — URINALYSIS, COMPLETE
BILIRUBIN UA: NEGATIVE
Ketones, UA: NEGATIVE
NITRITE UA: NEGATIVE
PH UA: 5.5 (ref 5.0–7.5)
Specific Gravity, UA: 1.02 (ref 1.005–1.030)
UUROB: 0.2 mg/dL (ref 0.2–1.0)

## 2017-08-10 MED ORDER — CIPROFLOXACIN HCL 500 MG PO TABS: 500.0000 mg | ORAL_TABLET | Freq: Once | ORAL | Status: DC

## 2017-08-10 MED ORDER — LIDOCAINE HCL URETHRAL/MUCOSAL 2 % EX GEL: 1.0000 "application " | Freq: Once | CUTANEOUS | Status: DC

## 2017-08-10 NOTE — Progress Notes (Signed)
Indications: Patient is 67 y.o., male who recently underwent bilateral ureteroscopic removal of distal ureteral calculi on 07/28/2017 by Dr. Gloriann Loan.   He had no postoperative problems.   The patient is presenting today for stent removal.  Procedure:  Flexible Cystoscopy with stent removal (92957)  Timeout was performed and the correct patient, procedure and participants were identified.    Description:  The patient was prepped and draped in the usual sterile fashion. Flexible cystosopy was performed.  The stent was visualized, grasped, and removed intact without difficulty.  The cystoscope was repassed and the second stent was removed in a similar fashion.  The patient tolerated the procedure well.  A single dose of oral antibiotics was given.  Complications:  None  Plan: He was instructed to call for development of flank pain post stent removal.  His stone analysis was mixed calcium with 87% calcium oxalate monohydrate.  He is a recurrent stone former and does have nonobstructing right lower pole renal calculi.  He denies previous metabolic evaluation which I have recommended.  He will be notified with results and a 38-month follow-up KUB was also scheduled.

## 2017-10-18 ENCOUNTER — Other Ambulatory Visit: Payer: Self-pay | Admitting: Sports Medicine

## 2017-10-18 DIAGNOSIS — M4126 Other idiopathic scoliosis, lumbar region: Secondary | ICD-10-CM

## 2017-10-18 DIAGNOSIS — M5442 Lumbago with sciatica, left side: Principal | ICD-10-CM

## 2017-10-18 DIAGNOSIS — G8929 Other chronic pain: Secondary | ICD-10-CM

## 2017-10-18 DIAGNOSIS — M5136 Other intervertebral disc degeneration, lumbar region: Secondary | ICD-10-CM

## 2017-10-18 DIAGNOSIS — G5711 Meralgia paresthetica, right lower limb: Secondary | ICD-10-CM

## 2017-11-02 ENCOUNTER — Ambulatory Visit
Admission: RE | Admit: 2017-11-02 | Discharge: 2017-11-02 | Disposition: A | Discharge status: Home / Self Care | Payer: BLUE CROSS/BLUE SHIELD | Source: Ambulatory Visit | Attending: Sports Medicine | Admitting: Sports Medicine

## 2017-11-02 DIAGNOSIS — G5711 Meralgia paresthetica, right lower limb: Secondary | ICD-10-CM | POA: Insufficient documentation

## 2017-11-02 DIAGNOSIS — M5136 Other intervertebral disc degeneration, lumbar region: Secondary | ICD-10-CM

## 2017-11-02 DIAGNOSIS — M5127 Other intervertebral disc displacement, lumbosacral region: Secondary | ICD-10-CM | POA: Insufficient documentation

## 2017-11-02 DIAGNOSIS — G8929 Other chronic pain: Secondary | ICD-10-CM | POA: Diagnosis not present

## 2017-11-02 DIAGNOSIS — M47816 Spondylosis without myelopathy or radiculopathy, lumbar region: Secondary | ICD-10-CM | POA: Diagnosis not present

## 2017-11-02 DIAGNOSIS — M5442 Lumbago with sciatica, left side: Secondary | ICD-10-CM

## 2017-11-02 DIAGNOSIS — M5116 Intervertebral disc disorders with radiculopathy, lumbar region: Secondary | ICD-10-CM | POA: Insufficient documentation

## 2017-11-02 DIAGNOSIS — M48061 Spinal stenosis, lumbar region without neurogenic claudication: Secondary | ICD-10-CM | POA: Diagnosis not present

## 2017-11-02 DIAGNOSIS — M4126 Other idiopathic scoliosis, lumbar region: Secondary | ICD-10-CM

## 2017-11-08 ENCOUNTER — Ambulatory Visit (INDEPENDENT_AMBULATORY_CARE_PROVIDER_SITE_OTHER): Payer: BLUE CROSS/BLUE SHIELD | Admitting: Urology

## 2017-11-08 ENCOUNTER — Ambulatory Visit
Admission: RE | Admit: 2017-11-08 | Discharge: 2017-11-08 | Disposition: A | Discharge status: Home / Self Care | Payer: BLUE CROSS/BLUE SHIELD | Source: Ambulatory Visit | Attending: Urology | Admitting: Urology

## 2017-11-08 ENCOUNTER — Encounter: Payer: Self-pay | Admitting: Urology

## 2017-11-08 ENCOUNTER — Other Ambulatory Visit: Payer: Self-pay | Admitting: Urology

## 2017-11-08 VITALS — BP 139/79 | HR 54 | Ht 69.0 in | Wt 176.7 lb

## 2017-11-08 DIAGNOSIS — N200 Calculus of kidney: Secondary | ICD-10-CM

## 2017-11-08 NOTE — Progress Notes (Signed)
11/08/2017 9:24 AM   Bobby Flowers 1950-07-04 [id number]  Referring provider: [name], MD [name] [place], [place] [address]  Chief Complaint  Patient presents with  . Nephrolithiasis    HPI: 67 year old male presents for follow-up of nephrolithiasis.  He underwent bilateral distal ureteroscopic stone removal by Dr. Gloriann Loan in June 2019.  The CT did show small, nonobstructing left lower pole calculi.  A metabolic evaluation was recommended and the patient states he sent this in 2 weeks ago.  The report is pending.  Since his last visit he states he has been doing well.  Denies flank, abdominal, pelvic or scrotal pain.  He has no voiding complaints and denies gross hematuria.  KUB performed today was reviewed and there are no calcifications suspicious for urinary tract stones.   PMH: Past Medical History:  Diagnosis Date  . Chest tightness    a. 05/2017 Cath: nl cors.  . Headache   . Hypothyroidism   . Mitral valve prolapse    a. 09/2015 Echo: EF 55-60%, no rwma, nl LA size, nl RV fxn, nl PASP.  [name]   . PAF (paroxysmal atrial fibrillation) ([place])    a. Dx in 1980's; b. CHA2DS2VASc = 1 - briefly on coumadin many years ago-->caused rash; c. Chronic Amio 100mg  QD.    Surgical History: Past Surgical History:  Procedure Laterality Date  . COLONOSCOPY  04/25/05  . COLONOSCOPY WITH PROPOFOL N/A 07/22/2015   Procedure: COLONOSCOPY WITH PROPOFOL;  Surgeon: Robert Bellow, MD;  Location[facility] ENDOSCOPY;  Service: Endoscopy;  Laterality: N/A;  . CYSTOSCOPY WITH HOLMIUM LASER LITHOTRIPSY Bilateral 07/28/2017   Procedure: CYSTOSCOPY WITH HOLMIUM LASER LITHOTRIPSY;  Surgeon: Lucas Mallow, MD;  Location: [facility] ORS;  Service: Urology;  Laterality: Bilateral;  . CYSTOSCOPY WITH STENT PLACEMENT Bilateral 07/28/2017   Procedure: CYSTOSCOPY WITH STENT PLACEMENT;  Surgeon: Lucas Mallow, MD;  Location: [facility] ORS;  Service: Urology;  Laterality:  Bilateral;  . CYSTOSCOPY WITH URETEROSCOPY Bilateral 07/28/2017   Procedure: CYSTOSCOPY WITH URETEROSCOPY;  Surgeon: Lucas Mallow, MD;  Location: [facility] ORS;  Service: Urology;  Laterality: Bilateral;  . HEMORROIDECTOMY    . HERNIA REPAIR Left May 22, 2015   Left inguinal hernia repair with medium Ultra Pro mesh  . INGUINAL HERNIA REPAIR Left 07/22/2015   Procedure: HERNIA REPAIR INGUINAL ADULT;  Surgeon: Robert Bellow, MD;  Location: [facility] ORS;  Service: General;  Laterality: Left;  . LEFT HEART CATH AND CORONARY ANGIOGRAPHY Left 05/29/2017   Procedure: LEFT HEART CATH AND CORONARY ANGIOGRAPHY;  Surgeon: [name], MD;  Location: [facility] CV LAB;  Service: Cardiovascular;  Laterality: Left;  . NASAL SINUS SURGERY    . VASECTOMY      Home Medications:  Allergies as of 11/08/2017      Reactions   Coumadin [warfarin] Rash      Medication List        Accurate as of 11/08/17  9:24 AM. Always use your most recent med list.          acetaZOLAMIDE 500 MG capsule Commonly known as:  DIAMOX TAKE 1 CAPSULE BY MOUTH NOW THEN TAKE 1 CAPSULE TONIGHT AND 1 TOMORROW MORNING   amiodarone 200 MG tablet Commonly known as:  PACERONE Take 1 tablet (200 mg total) by mouth daily.   aspirin EC 81 MG tablet Take 1 tablet (81 mg total) by mouth daily.   atenolol 100 MG tablet Commonly known  as:  TENORMIN TAKE 1 TABLET (100 MG TOTAL) BY MOUTH DAILY.   etodolac 500 MG tablet Commonly known as:  LODINE Take 500 mg by mouth 2 (two) times daily.   gabapentin 300 MG capsule Commonly known as:  NEURONTIN TAKE 1 CAPSULE BY MOUTH EVERY DAY AT NIGHT   latanoprost 0.005 % ophthalmic solution Commonly known as:  XALATAN Place 1 drop into both eyes at bedtime.   levothyroxine 75 MCG tablet Commonly known as:  SYNTHROID, LEVOTHROID Take 75 mcg by mouth daily before breakfast. Reported on 08/05/2015   methocarbamol 500 MG tablet Commonly known as:  ROBAXIN TAKE 1 TABLET (500 MG  TOTAL) BY MOUTH 2 (TWO) TIMES DAILY AS NEEDED (MUSCLE SPASMS)   oxyCODONE 5 MG immediate release tablet Commonly known as:  Oxy IR/ROXICODONE Take 1 tablet (5 mg total) by mouth every 4 (four) hours as needed for moderate pain.   prednisoLONE acetate 1 % ophthalmic suspension Commonly known as:  PRED FORTE INSTILL 1 DROP INTO RIGHT EYE 4 TIMES A DAY   SUMAtriptan 100 MG tablet Commonly known as:  IMITREX Take 100 mg by mouth as needed for migraine.       Allergies:  Allergies  Allergen Reactions  . Coumadin [Warfarin] Rash    Family History: Family History  Problem Relation Age of Onset  . Heart failure Mother   . Leukemia Father   . Alcoholism Brother     Social History:  reports that he quit smoking about 17 years ago. His smoking use included cigarettes. He has a 46.50 pack-year smoking history. He has never used smokeless tobacco. He reports that he does not drink alcohol or use drugs.  ROS: UROLOGY Frequent Urination?: No Hard to postpone urination?: No Burning/pain with urination?: No Get up at night to urinate?: No Leakage of urine?: No Urine stream starts and stops?: No Trouble starting stream?: No Do you have to strain to urinate?: No Blood in urine?: No Urinary tract infection?: No Sexually transmitted disease?: No Injury to kidneys or bladder?: No Painful intercourse?: No Weak stream?: No Erection problems?: No Penile pain?: No  Gastrointestinal Nausea?: No Vomiting?: No Indigestion/heartburn?: No Diarrhea?: No Constipation?: No  Constitutional Fever: No Night sweats?: No Weight loss?: No Fatigue?: No  Skin Skin rash/lesions?: No Itching?: No  Eyes Blurred vision?: No Double vision?: No  Ears/Nose/Throat Sore throat?: No Sinus problems?: No  Hematologic/Lymphatic Swollen glands?: No Easy bruising?: No  Cardiovascular Leg swelling?: No Chest pain?: No  Respiratory Cough?: No Shortness of breath?:  No  Endocrine Excessive thirst?: No  Musculoskeletal Back pain?: No Joint pain?: No  Neurological Headaches?: No Dizziness?: No  Psychologic Depression?: No Anxiety?: No  Physical Exam: BP 139/79 (BP Location: Left Arm, Patient Position: Sitting, Cuff Size: Normal)   Pulse (!) 54   Ht 5\' 9"  (1.753 m)   Wt 176 lb 11.2 oz (80.2 kg)   BMI 26.09 kg/m   Constitutional:  Alert and oriented, No acute distress. HEENT: [place] AT, moist mucus membranes.  Trachea midline, no masses. Cardiovascular: No clubbing, cyanosis, or edema. Respiratory: Normal respiratory effort, no increased work of breathing. GI: Abdomen is soft, nontender, nondistended, no abdominal masses GU: No CVA tenderness Lymph: No cervical or inguinal lymphadenopathy. Skin: No rashes, bruises or suspicious lesions. Neurologic: Grossly intact, no focal deficits, moving all 4 extremities. Psychiatric: Normal mood and affect.   Assessment & Plan:   67 year old male with nonobstructing or pole renal calculi which are not visualized on KUB today.  He recently returned to his 24 urine study and will be notified with results when they are available.  If no significant abnormalities recommend a one-year follow-up with KUB.  Return in about 1 year (around 11/09/2018) for KUB, Recheck.  Abbie Sons, [place] [address] [place], [place] [address] [phone]

## 2018-04-17 ENCOUNTER — Ambulatory Visit (INDEPENDENT_AMBULATORY_CARE_PROVIDER_SITE_OTHER): Payer: BLUE CROSS/BLUE SHIELD

## 2018-04-17 ENCOUNTER — Ambulatory Visit (INDEPENDENT_AMBULATORY_CARE_PROVIDER_SITE_OTHER): Payer: BLUE CROSS/BLUE SHIELD | Admitting: Podiatry

## 2018-04-17 ENCOUNTER — Encounter: Payer: Self-pay | Admitting: Podiatry

## 2018-04-17 DIAGNOSIS — M722 Plantar fascial fibromatosis: Secondary | ICD-10-CM

## 2018-04-17 DIAGNOSIS — E079 Disorder of thyroid, unspecified: Secondary | ICD-10-CM | POA: Insufficient documentation

## 2018-04-17 DIAGNOSIS — G43909 Migraine, unspecified, not intractable, without status migrainosus: Secondary | ICD-10-CM | POA: Insufficient documentation

## 2018-04-17 MED ORDER — MELOXICAM 15 MG PO TABS: 15.0000 mg | ORAL_TABLET | Freq: Every day | ORAL | 1 refills | Status: AC

## 2018-04-20 NOTE — Progress Notes (Signed)
   Subjective: 68 year old male presenting today for follow up evaluation of right foot pain secondary to plantar fasciitis that began flaring up about 3 weeks ago. He states he has not had any issues until recently when the pain returned. Walking for long periods of time increases the pain. He has not done anything for treatment at this time. Patient is here for further evaluation and treatment.   Past Medical History:  Diagnosis Date  . Chest tightness    a. 05/2017 Cath: nl cors.  . Headache   . Hypothyroidism   . Mitral valve prolapse    a. 09/2015 Echo: EF 55-60%, no rwma, nl LA size, nl RV fxn, nl PASP.  [name]   . PAF (paroxysmal atrial fibrillation) ([place])    a. Dx in 1980's; b. CHA2DS2VASc = 1 - briefly on coumadin many years ago-->caused rash; c. Chronic Amio 100mg  QD.     Objective: Physical Exam General: The patient is alert and oriented x3 in no acute distress.  Dermatology: Skin is warm, dry and supple bilateral lower extremities. Negative for open lesions or macerations bilateral.   Vascular: Dorsalis Pedis and Posterior Tibial pulses palpable bilateral.  Capillary fill time is immediate to all digits.  Neurological: Epicritic and protective threshold intact bilateral.   Musculoskeletal: Tenderness to palpation to the plantar aspect of the right heel along the plantar fascia. All other joints range of motion within normal limits bilateral. Strength 5/5 in all groups bilateral.   Radiographic exam: Normal osseous mineralization. Joint spaces preserved. No fracture/dislocation/boney destruction. No other soft tissue abnormalities or radiopaque foreign bodies.   Assessment: 1. Plantar fasciitis right   Plan of Care:  1. Patient evaluated. Xrays reviewed.   2. Injection of 0.5cc Celestone soluspan injected into the right plantar fascia  3. Plantar fascial band(s) dispensed 4. Prescription for Meloxicam provided to patient. 5. Instructed patient regarding  therapies and modalities at home to alleviate symptoms.  6. Return to clinic in 4 weeks.    Works at [employer].    Edrick Kins, DPM [facility]  Dr. Edrick Kins, DPM    [address], [place] [address]                Office [phone]  Fax [phone]

## 2018-05-09 ENCOUNTER — Telehealth: Payer: Self-pay

## 2018-05-09 MED ORDER — ATENOLOL 100 MG PO TABS: 100.0000 mg | ORAL_TABLET | Freq: Every day | ORAL | 0 refills | Status: DC

## 2018-05-09 NOTE — Telephone Encounter (Signed)
Left a message on patient's answering machine to call back regarding a follow up appointment.  Refill sent for Atenolol 100 mg one tablet daily for 90 day supply.

## 2018-05-14 NOTE — Telephone Encounter (Signed)
Patient returning call  Please advise on where to schedule if patient will need an appointment

## 2018-05-14 NOTE — Telephone Encounter (Signed)
   Cardiac Questionnaire:    Since your last visit or hospitalization:    1. Have you been having new or worsening chest pain? No   2. Have you been having new or worsening shortness of breath? No 3. Have you been having new or worsening leg swelling, wt gain, or increase in abdominal girth (pants fitting more tightly)? No   4. Have you had any passing out spells? No  Patient will be put on list to call for follow up in 2-3 months.

## 2018-05-15 ENCOUNTER — Encounter: Payer: Self-pay | Admitting: Podiatry

## 2018-05-15 ENCOUNTER — Other Ambulatory Visit: Payer: Self-pay

## 2018-05-15 ENCOUNTER — Ambulatory Visit (INDEPENDENT_AMBULATORY_CARE_PROVIDER_SITE_OTHER): Payer: BLUE CROSS/BLUE SHIELD | Admitting: Podiatry

## 2018-05-15 DIAGNOSIS — M722 Plantar fascial fibromatosis: Secondary | ICD-10-CM | POA: Diagnosis not present

## 2018-05-16 NOTE — Progress Notes (Signed)
   Subjective: 68 year old male presenting today for follow up evaluation of plantar fasciitis of the right foot. He states the pain is improving and has almost resolved. He notes being on his feet for long periods of time exacerbates his symptoms. He has been taking Meloxicam for treatment. Patient is here for further evaluation and treatment.   Past Medical History:  Diagnosis Date  . Chest tightness    a. 05/2017 Cath: nl cors.  . Headache   . Hypothyroidism   . Mitral valve prolapse    a. 09/2015 Echo: EF 55-60%, no rwma, nl LA size, nl RV fxn, nl PASP.  [name]   . PAF (paroxysmal atrial fibrillation) ([place])    a. Dx in 1980's; b. CHA2DS2VASc = 1 - briefly on coumadin many years ago-->caused rash; c. Chronic Amio 100mg  QD.     Objective: Physical Exam General: The patient is alert and oriented x3 in no acute distress.  Dermatology: Skin is warm, dry and supple bilateral lower extremities. Negative for open lesions or macerations bilateral.   Vascular: Dorsalis Pedis and Posterior Tibial pulses palpable bilateral.  Capillary fill time is immediate to all digits.  Neurological: Epicritic and protective threshold intact bilateral.   Musculoskeletal: Tenderness to palpation to the plantar aspect of the right heel along the plantar fascia. All other joints range of motion within normal limits bilateral. Strength 5/5 in all groups bilateral.   Assessment: 1. Plantar fasciitis right - improved    Plan of Care:  1. Patient evaluated.    2. Injection of 0.5cc Celestone soluspan injected into the right plantar fascia  3. Continue taking Meloxicam as needed.  4. Continue using custom orthotics.  5. Return to clinic as needed.   Works at [employer].    Edrick Kins, DPM [facility]  Dr. Edrick Kins, DPM    [address], [place] [address]                Office [phone]  Fax [phone]

## 2018-07-03 ENCOUNTER — Other Ambulatory Visit: Payer: Self-pay

## 2018-07-03 MED ORDER — AMIODARONE HCL 200 MG PO TABS: 200.0000 mg | ORAL_TABLET | Freq: Every day | ORAL | 0 refills | Status: DC

## 2018-07-03 NOTE — Telephone Encounter (Signed)
Refill sent for Amiodarone 200 mg take one tablet daily.

## 2018-08-03 ENCOUNTER — Other Ambulatory Visit: Payer: Self-pay | Admitting: Cardiovascular Disease

## 2018-08-22 ENCOUNTER — Other Ambulatory Visit: Payer: Self-pay | Admitting: Podiatry

## 2018-08-27 ENCOUNTER — Telehealth: Payer: Self-pay

## 2018-08-27 MED ORDER — MELOXICAM 15 MG PO TABS: 15.0000 mg | ORAL_TABLET | Freq: Every day | ORAL | 5 refills | Status: DC

## 2018-08-27 NOTE — Telephone Encounter (Signed)
Patient called requesting refill for his Meloxicam 15mg .  Per Dr. Amalia Hailey verbal order, ok to refill.  Script has been sent to pharmacy

## 2018-08-28 ENCOUNTER — Telehealth: Payer: Self-pay

## 2018-08-28 NOTE — Telephone Encounter (Signed)

## 2018-08-29 ENCOUNTER — Encounter: Payer: Self-pay | Admitting: Nurse Practitioner

## 2018-08-29 ENCOUNTER — Ambulatory Visit (INDEPENDENT_AMBULATORY_CARE_PROVIDER_SITE_OTHER): Payer: BC Managed Care – PPO | Admitting: Nurse Practitioner

## 2018-08-29 ENCOUNTER — Other Ambulatory Visit: Payer: Self-pay

## 2018-08-29 VITALS — BP 106/60 | HR 49 | Ht 70.0 in | Wt 178.0 lb

## 2018-08-29 DIAGNOSIS — I341 Nonrheumatic mitral (valve) prolapse: Secondary | ICD-10-CM

## 2018-08-29 DIAGNOSIS — I712 Thoracic aortic aneurysm, without rupture: Secondary | ICD-10-CM | POA: Diagnosis not present

## 2018-08-29 DIAGNOSIS — I7121 Aneurysm of the ascending aorta, without rupture: Secondary | ICD-10-CM

## 2018-08-29 DIAGNOSIS — R001 Bradycardia, unspecified: Secondary | ICD-10-CM | POA: Diagnosis not present

## 2018-08-29 DIAGNOSIS — I480 Paroxysmal atrial fibrillation: Secondary | ICD-10-CM | POA: Diagnosis not present

## 2018-08-29 MED ORDER — AMIODARONE HCL 200 MG PO TABS: 200.0000 mg | ORAL_TABLET | Freq: Every day | ORAL | 3 refills | Status: DC

## 2018-08-29 MED ORDER — ATENOLOL 100 MG PO TABS: 50.0000 mg | ORAL_TABLET | Freq: Every day | ORAL | 3 refills | Status: DC

## 2018-08-29 NOTE — Patient Instructions (Signed)
Medication Instructions:  Your physician has recommended you make the following change in your medication:  1- DECREASE Atenolol to 0.5 tablet ( 50 mg total) once daily  If you need a refill on your cardiac medications before your next appointment, please call your pharmacy.   Lab work: Your physician recommends that you have lab work today(CBC, CMET, TSH)   If you have labs (blood work) drawn today and your tests are completely normal, you will receive your results only by: Marland Kitchen MyChart Message (if you have MyChart) OR . A paper copy in the mail If you have any lab test that is abnormal or we need to change your treatment, we will call you to review the results.  Testing/Procedures: 1- Pulmonary Function Test Call Specialty Scheduling for appt [phone] Your physician has recommended that you have a pulmonary function test. Pulmonary Function Tests are a group of tests that measure how well air moves in and out of your lungs. Appt date_____ Time_______ [place] aprx 1 hr - No smoking within 1 hr - No alcohol 4 hrs prior - No vigorous exercise 30 min prior - No tight clothing, no girtles - No Albuterol, Xopenex or any other nebulized medications or inhalers 4 hours prior to testing.  - No caffeine 12 hours prior to testing  2- CT Aorta A CT Aorta has been ordered by your provider, Christell Faith, PA.  Please call imaging to schedule at [phone].  CT Angiography (CTA), is a special type of CT scan that uses a computer to produce multi-dimensional views of major blood vessels throughout the body. In CT angiography, a contrast material is injected through an IV to help visualize the blood vessels  Please arrive at the [facility] at 30-45 minutes prior to test start time.   Please follow these instructions carefully (unless otherwise directed):   On the Night Before the Test: . Be sure to Drink plenty of water. . Do not consume any  caffeinated/decaffeinated beverages or chocolate 12 hours prior to your test. . Do not take any antihistamines 12 hours prior to your test.  On the Day of the Test: . Drink plenty of water. Do not drink any water within one hour of the test. . Do not eat any food 4 hours prior to the test. . You may take your regular medications prior to the test.  . Take metoprolol (Lopressor) two hours prior to test. (atenolol)       After the Test: . Drink plenty of water. . After receiving IV contrast, you may experience a mild flushed feeling. This is normal. . On occasion, you may experience a mild rash up to 24 hours after the test. This is not dangerous. If this occurs, you can take Benadryl 25 mg and increase your fluid intake. . If you experience trouble breathing, this can be serious. If it is severe call 911 IMMEDIATELY. If it is mild, please call our office. . If you take any of these medications: Glipizide/Metformin, Avandament, Glucavance, please do not take 48 hours after completing test.   Follow-Up: At [facility], you and your health needs are our priority.  As part of our continuing mission to provide you with exceptional heart care, we have created designated Provider Care Teams.  These Care Teams include your primary Cardiologist (physician) and Advanced Practice Providers (APPs -  Physician Assistants and Nurse Practitioners) who all work together to provide you with the care you need, when you  need it. You will need a follow up appointment in 12 months.  Please call our office 2 months in advance to schedule this appointment.  Please see [name], MD.

## 2018-08-29 NOTE — Progress Notes (Signed)
Office Visit    Patient Name: Bobby Flowers Date of Encounter: 08/29/2018  Primary Care Provider:  [name], MD Primary Cardiologist:  [name], MD  Chief Complaint    68 year old male with a history of paroxysmal atrial fibrillation, mitral valve prolapse, hypothyroidism, dilated aortic root, and chest pain with normal coronary arteries by catheterization in April 2019, who presents for follow-up of paroxysmal atrial fibrillation.  Past Medical History    Past Medical History:  Diagnosis Date   Chest tightness    a. 05/2017 Cath: nl cors.   Dilated aortic root ([place])    a. 06/2017 Echo: Ao root 4.4cm.   Headache    Hypothyroidism    Mitral valve prolapse    a. 09/2015 Echo: EF 55-60%, no rwma, nl LA size, nl RV fxn, nl PASP; b. 06/2017 Echo: EF 55-60%, no rwma, Gr2 DD. Asc Ao 4.4cm. Mild MR (? mild prolapse). Mildly dil LA.   Nephrolithiasis    a. 07/2017 CT abd: 4mm obstructing distal R ureteral calculus s/p lithotripsy and stenting.   PAF (paroxysmal atrial fibrillation) ([place])    a. Dx in 1980's; b. CHA2DS2VASc = 1 - briefly on coumadin many years ago-->caused rash; c. Chronic Amio 200mg  QD.   Past Surgical History:  Procedure Laterality Date   COLONOSCOPY  04/25/05   COLONOSCOPY WITH PROPOFOL N/A 07/22/2015   Procedure: COLONOSCOPY WITH PROPOFOL;  Surgeon: Robert Bellow, MD;  Location: [facility] ENDOSCOPY;  Service: Endoscopy;  Laterality: N/A;   CYSTOSCOPY WITH HOLMIUM LASER LITHOTRIPSY Bilateral 07/28/2017   Procedure: CYSTOSCOPY WITH HOLMIUM LASER LITHOTRIPSY;  Surgeon: Lucas Mallow, MD;  Location: [facility] ORS;  Service: Urology;  Laterality: Bilateral;   CYSTOSCOPY WITH STENT PLACEMENT Bilateral 07/28/2017   Procedure: CYSTOSCOPY WITH STENT PLACEMENT;  Surgeon: Lucas Mallow, MD;  Location: [facility] ORS;  Service: Urology;  Laterality: Bilateral;   CYSTOSCOPY WITH URETEROSCOPY Bilateral 07/28/2017   Procedure: CYSTOSCOPY WITH URETEROSCOPY;  Surgeon:  Lucas Mallow, MD;  Location: [facility] ORS;  Service: Urology;  Laterality: Bilateral;   HEMORROIDECTOMY     HERNIA REPAIR Left May 22, 2015   Left inguinal hernia repair with medium Ultra Pro mesh   INGUINAL HERNIA REPAIR Left 07/22/2015   Procedure: HERNIA REPAIR INGUINAL ADULT;  Surgeon: Robert Bellow, MD;  Location: [facility] ORS;  Service: General;  Laterality: Left;   LEFT HEART CATH AND CORONARY ANGIOGRAPHY Left 05/29/2017   Procedure: LEFT HEART CATH AND CORONARY ANGIOGRAPHY;  Surgeon: [name], MD;  Location: [facility] CV LAB;  Service: Cardiovascular;  Laterality: Left;   NASAL SINUS SURGERY     VASECTOMY      Allergies  Allergies  Allergen Reactions   Coumadin [Warfarin] Rash    History of Present Illness    68 year old male with the above past medical history including paroxysmal atrial fibrillation dating back to the 1980s.  At one point he was on warfarin but had an allergic reaction with a rash.  His chads BASC is 1 in the setting of age of 31 and he has not been anticoagulated.  He is on chronic amiodarone therapy.  He was previously evaluated in the atrial fibrillation clinic in 2015 and was felt that given stability on low-dose amiodarone, continued medical therapy was appropriate.  In April 2019, he reported symptoms of unstable angina and underwent diagnostic catheterization which revealed normal coronary arteries.  Subsequent echo in May 2018 showed normal LV function with grade 2 diastolic dysfunction.  His amiodarone dose required titration to 200 mg daily in April 2019 in the setting of increased frequency of paroxysmal A. fib, with improved symptoms.  Since his last visit in May 2019, he has done well.  He continues to work at [employer] and he says this requires a fair amount of exertion.  He tolerates this well but says that since he has been wearing a mask, he does sometimes experience dyspnea on exertion at higher levels of activity.  He  occasionally have palpitations but these are typically brief and infrequent.  Overall, he does not think that A. fib is much of a problem for him.  He denies chest pain, PND, orthopnea, dizziness, syncope, edema, or early satiety.  Home Medications    Prior to Admission medications   Medication Sig Start Date End Date Taking? Authorizing Provider  acetaZOLAMIDE (DIAMOX) 500 MG capsule TAKE 1 CAPSULE BY MOUTH NOW THEN TAKE 1 CAPSULE TONIGHT AND 1 TOMORROW MORNING 08/01/17   [provider]  amiodarone (PACERONE) 200 MG tablet Take 1 tablet (200 mg total) by mouth daily. 07/03/18   Theora Gianotti, NP  amoxicillin (AMOXIL) 250 MG capsule TAKE ONE CAPSULE BY MOUTH 3 TIMES A DAY UNTIL FINISHED 01/28/18   [provider]  aspirin EC 81 MG tablet Take 1 tablet (81 mg total) by mouth daily. 05/23/17   [name], MD  atenolol (TENORMIN) 100 MG tablet TAKE 1 TABLET (100 MG TOTAL) BY MOUTH DAILY. PLEASE CONTACT THE OFFICE FOR A FOLLOW UP APPOINTMENT. 08/03/18   [name], MD  etodolac (LODINE) 500 MG tablet Take 500 mg by mouth 2 (two) times daily. 10/20/17   [provider]  gabapentin (NEURONTIN) 300 MG capsule TAKE 1 CAPSULE BY MOUTH EVERY DAY AT NIGHT 11/01/17   [provider]  latanoprost (XALATAN) 0.005 % ophthalmic solution Place 1 drop into both eyes at bedtime.  04/06/16   [provider]  levothyroxine (SYNTHROID, LEVOTHROID) 75 MCG tablet Take 75 mcg by mouth daily before breakfast. Reported on 08/05/2015 05/05/13   [provider]  meloxicam (MOBIC) 15 MG tablet Take 1 tablet (15 mg total) by mouth daily. 08/27/18   Edrick Kins, DPM  methocarbamol (ROBAXIN) 500 MG tablet TAKE 1 TABLET (500 MG TOTAL) BY MOUTH 2 (TWO) TIMES DAILY AS NEEDED (MUSCLE SPASMS) 10/16/17   [provider]  oxyCODONE (OXY IR/ROXICODONE) 5 MG immediate release tablet Take 1 tablet (5 mg total) by mouth every 4 (four) hours as needed for moderate  pain. 07/28/17   Marton Redwood III, MD  prednisoLONE acetate (PRED FORTE) 1 % ophthalmic suspension INSTILL 1 DROP INTO RIGHT EYE 4 TIMES A DAY 08/01/17   [provider]  SUMAtriptan (IMITREX) 100 MG tablet Take 100 mg by mouth as needed for migraine.  01/18/16   [provider]    Review of Systems    Occas dyspnea @ higher levels of activity.  Occas palps, but stable.  He denies chest pain, pnd, orthopnea, n, v, dizziness, syncope, edema, weight gain, or early satiety.  All other systems reviewed and are otherwise negative except as noted above.  Physical Exam    VS:  BP 106/60 (BP Location: Left Arm, Patient Position: Sitting, Cuff Size: Normal)    Pulse (!) 49    Ht 5\' 10"  (1.778 m)    Wt 178 lb (80.7 kg)    BMI 25.54 kg/m  , BMI Body mass index is 25.54 kg/m. GEN:  Well nourished, well developed, in no acute distress. HEENT: normal. Neck: Supple, no JVD, carotid bruits, or masses. Cardiac: RRR, 2/6 Syst murmur @ the LLSB to the apex, no rubs, or gallops. No clubbing, cyanosis, edema.  Radials/PT 2+ and equal bilaterally.  Respiratory:  Respirations regular and unlabored, clear to auscultation bilaterally. GI: Soft, nontender, nondistended, BS + x 4. MS: no deformity or atrophy. Skin: warm and dry, no rash. Neuro:  Strength and sensation are intact. Psych: Normal affect.  Accessory Clinical Findings    ECG personally reviewed by me today - sinus brady, 49, 1st deg avb, RAD, poor R progression.  QTc 408 msec - no acute changes.  Lab Results  Component Value Date   WBC 14.3 (H) 07/28/2017   HGB 15.9 07/28/2017   HCT 46.4 07/28/2017   MCV 88.2 07/28/2017   PLT 177 07/28/2017   Lab Results  Component Value Date   CREATININE 1.27 (H) 07/28/2017   BUN 26 (H) 07/28/2017   NA 132 (L) 07/28/2017   K 4.1 07/28/2017   CL 99 (L) 07/28/2017   CO2 24 07/28/2017   Lab Results  Component Value Date   ALT 15 (L) 07/28/2017   AST 20 07/28/2017   ALKPHOS 69  07/28/2017   BILITOT 1.6 (H) 07/28/2017     Assessment & Plan    1.  Paroxysmal atrial fibrillation: Patient has been doing well over the past year with very few episodes of palpitations and no sustained A. fib.  He remains on amiodarone 200 mg daily as well as atenolol 100 mg daily.  He is bradycardic at a rate of 49 and a blood pressure of 106/60 today.  He has had some fatigue with activity and in that setting, I am going to reduce his atenolol to 50 mg daily.  He will continue to follow symptoms and if necessary, we could potentially go back up to 75 mg daily.  CHA2DS2VASc equals 1 for age of 27.  He is not currently on oral anticoagulation (rash with Coumadin in the 80s).  Follow-up routine amiodarone surveillance labs today including complete metabolic panel, TSH, and pulmonary function testing, which he has not had since the 1980s.  2.  Dilated ascending aorta: 4.4 cm by echo last year.  As previously planned, I will arrange for a CTA of the thoracic aorta to re-eval.  Follow-up renal function today pre-CT.  Heart rate and blood pressure stable/soft.  Adjusting beta-blocker as above.  3.  Mitral valve prolapse: Mild mitral irritation noted on echo last year.  Asymptomatic.  4.  Hypothyroidism: Following up TSH today in the setting of ongoing amiodarone therapy.  5.  Sinus bradycardia: Reducing atenolol to 50 mg daily in the setting of heart rate of 49.  6.  Disposition: Follow-up complete metabolic panel, TSH, CBC, pulmonary function testing, and CTA of the thoracic aorta as outlined above.  Follow-up in clinic in 1 year or sooner if necessary.  Murray Hodgkins, NP 08/29/2018, 3:28 PM

## 2018-08-30 LAB — CBC
Hematocrit: 45 % (ref 37.5–51.0)
Hemoglobin: 15.1 g/dL (ref 13.0–17.7)
MCH: 30.1 pg (ref 26.6–33.0)
MCHC: 33.6 g/dL (ref 31.5–35.7)
MCV: 90 fL (ref 79–97)
Platelets: 174 10*3/uL (ref 150–450)
RBC: 5.01 x10E6/uL (ref 4.14–5.80)
RDW: 12.9 % (ref 11.6–15.4)
WBC: 8.3 10*3/uL (ref 3.4–10.8)

## 2018-08-30 LAB — COMPREHENSIVE METABOLIC PANEL
ALT: 13 IU/L (ref 0–44)
AST: 13 IU/L (ref 0–40)
Albumin/Globulin Ratio: 2.3 — ABNORMAL HIGH (ref 1.2–2.2)
Albumin: 4.4 g/dL (ref 3.8–4.8)
Alkaline Phosphatase: 72 IU/L (ref 39–117)
BUN/Creatinine Ratio: 26 — ABNORMAL HIGH (ref 10–24)
BUN: 24 mg/dL (ref 8–27)
Bilirubin Total: 1.2 mg/dL (ref 0.0–1.2)
CO2: 23 mmol/L (ref 20–29)
Calcium: 8.8 mg/dL (ref 8.6–10.2)
Chloride: 103 mmol/L (ref 96–106)
Creatinine, Ser: 0.92 mg/dL (ref 0.76–1.27)
GFR calc Af Amer: 99 mL/min/{1.73_m2} (ref >59)
GFR calc non Af Amer: 86 mL/min/{1.73_m2} (ref >59)
Globulin, Total: 1.9 g/dL (ref 1.5–4.5)
Glucose: 92 mg/dL (ref 65–99)
Potassium: 4.7 mmol/L (ref 3.5–5.2)
Sodium: 139 mmol/L (ref 134–144)
Total Protein: 6.3 g/dL (ref 6.0–8.5)

## 2018-08-30 LAB — TSH: TSH: 9.37 u[IU]/mL — ABNORMAL HIGH (ref 0.450–4.500)

## 2018-08-31 ENCOUNTER — Other Ambulatory Visit: Payer: Self-pay

## 2018-08-31 ENCOUNTER — Telehealth: Payer: Self-pay

## 2018-08-31 DIAGNOSIS — I4819 Other persistent atrial fibrillation: Secondary | ICD-10-CM

## 2018-08-31 DIAGNOSIS — I480 Paroxysmal atrial fibrillation: Secondary | ICD-10-CM

## 2018-08-31 NOTE — Progress Notes (Unsigned)
pft  

## 2018-08-31 NOTE — Telephone Encounter (Signed)
Call received from pulmonary dept. The pt PFT order needs to be changed from hospital to auxiliary performed. Updated the order in Epic. Pt contacted them to schedule his PFT. Their 1st available appt will probably be late July. Pt will need a COVID test prior to the PFT. Order placed. They will contact our office once the pt PFT is scheduled so that his COVID testing can be coordinated.

## 2018-09-05 ENCOUNTER — Other Ambulatory Visit: Payer: Self-pay

## 2018-09-05 ENCOUNTER — Ambulatory Visit
Admission: RE | Admit: 2018-09-05 | Discharge: 2018-09-05 | Disposition: A | Discharge status: Home / Self Care | Payer: BC Managed Care – PPO | Source: Ambulatory Visit | Attending: Nurse Practitioner | Admitting: Nurse Practitioner

## 2018-09-05 DIAGNOSIS — I712 Thoracic aortic aneurysm, without rupture: Secondary | ICD-10-CM | POA: Diagnosis not present

## 2018-09-05 DIAGNOSIS — I7121 Aneurysm of the ascending aorta, without rupture: Secondary | ICD-10-CM

## 2018-09-05 MED ORDER — IOHEXOL 350 MG/ML SOLN
75.0000 mL | Freq: Once | INTRAVENOUS | Status: AC | PRN
  Administered 2018-09-05: 16:00:00 75 mL via INTRAVENOUS

## 2018-09-07 ENCOUNTER — Telehealth: Payer: Self-pay | Admitting: Cardiovascular Disease

## 2018-09-07 ENCOUNTER — Telehealth: Payer: Self-pay | Admitting: Nurse Practitioner

## 2018-09-07 DIAGNOSIS — I712 Thoracic aortic aneurysm, without rupture: Secondary | ICD-10-CM

## 2018-09-07 DIAGNOSIS — I7121 Aneurysm of the ascending aorta, without rupture: Secondary | ICD-10-CM

## 2018-09-07 NOTE — Telephone Encounter (Signed)
Attempting to place the referral to [facility]- noticed referral already placed.   Referral was placed yesterday by our [place]. The patient has an appointment on 10/17/18 for Dr. Cyndia Bent.

## 2018-09-07 NOTE — Telephone Encounter (Signed)
° °  Patient was calling to go over the results of his recent CT scan.

## 2018-09-07 NOTE — Telephone Encounter (Signed)
I spoke with the patient regarding his CTA results. He is aware of Ignacia Bayley, NP's recommendations to have a consult with cardiothoracic surgery.  He is agreeable and I advised I will place the referral to the cardiothoracic surgeons in [place] and they will be in touch with him to schedule.  He voices understanding.

## 2018-09-07 NOTE — Telephone Encounter (Signed)
Pt aware of CT results and cardiothoracic referral ordered Pt aware if does not here from surgeons office in the next week or so to call back ./cy

## 2018-09-07 NOTE — Telephone Encounter (Signed)
Notes recorded by Theora Gianotti, NP on 09/06/2018 at 11:42 AM EDT  Ascending aortic aneurysm is slightly larger than what was noted on echocardiogram last year - now 4.5cm. At this point, please refer to cardiothoracic surgery, who will continue to follow with serial CTs over the years.

## 2018-09-25 ENCOUNTER — Other Ambulatory Visit: Payer: Self-pay

## 2018-09-25 LAB — T4: T4, Total: 10.1 ug/dL (ref 4.5–12.0)

## 2018-09-25 LAB — SPECIMEN STATUS REPORT

## 2018-09-25 MED ORDER — AMIODARONE HCL 200 MG PO TABS: 200.0000 mg | ORAL_TABLET | Freq: Every day | ORAL | 3 refills | Status: DC

## 2018-09-27 ENCOUNTER — Encounter: Payer: Self-pay | Admitting: General Surgery

## 2018-10-17 ENCOUNTER — Other Ambulatory Visit: Payer: Self-pay

## 2018-10-17 ENCOUNTER — Encounter: Payer: Self-pay | Admitting: Surgery

## 2018-10-17 ENCOUNTER — Institutional Professional Consult (permissible substitution) (INDEPENDENT_AMBULATORY_CARE_PROVIDER_SITE_OTHER): Payer: BC Managed Care – PPO | Admitting: Surgery

## 2018-10-17 VITALS — BP 126/79 | HR 58 | Temp 98.1°F | Resp 18 | Ht 70.0 in | Wt 178.7 lb

## 2018-10-17 DIAGNOSIS — I712 Thoracic aortic aneurysm, without rupture: Secondary | ICD-10-CM | POA: Diagnosis not present

## 2018-10-17 DIAGNOSIS — I7121 Aneurysm of the ascending aorta, without rupture: Secondary | ICD-10-CM

## 2018-10-17 NOTE — Progress Notes (Signed)
Cardiothoracic Surgery Consultation  PCP is [name], MD Referring Provider is Wilder Glade*  Chief Complaint  Patient presents with  . Thoracic Aortic Aneurysm    Surgical eval Chest CTA 09/05/18, ECHO 06/23/18    HPI:  The patient is a 68 year old gentleman with history of paroxysmal atrial fibrillation, mitral valve prolapse with minimal regurgitation, hypothyroidism, and ascending aortic aneurysm who had a recent CTA of the chest to follow-up on the aneurysm.  This showed the maximum diameter of the ascending aorta to be 4.5 cm without evidence of aortic dissection.  Review of his EPIC records show that he had an echocardiogram on 10/09/2015 where the ascending aorta was measured at 31 mm and a follow-up echo on 06/22/2017 where the ascending aorta was measured at 40 mm.  He had some atypical chest pain in 2019 and underwent cardiac catheterization on 05/29/2017 which showed normal coronary arteries.  The patient denies any chest or back pain.  He has no family history of aortic aneurysm or aortic dissection or connective tissue disorder.  Past Medical History:  Diagnosis Date  . Chest tightness    a. 05/2017 Cath: nl cors.  . Dilated aortic root ([place])    a. 06/2017 Echo: Ao root 4.4cm.  Marland Kitchen Headache   . Hypothyroidism   . Mitral valve prolapse    a. 09/2015 Echo: EF 55-60%, no rwma, nl LA size, nl RV fxn, nl PASP; b. 06/2017 Echo: EF 55-60%, no rwma, Gr2 DD. Asc Ao 4.4cm. Mild MR (? mild prolapse). Mildly dil LA.  . Nephrolithiasis    a. 07/2017 CT abd: 75mm obstructing distal R ureteral calculus s/p lithotripsy and stenting.  Marland Kitchen PAF (paroxysmal atrial fibrillation) ([place])    a. Dx in 1980's; b. CHA2DS2VASc = 1 - briefly on coumadin many years ago-->caused rash; c. Chronic Amio 200mg  QD.    Past Surgical History:  Procedure Laterality Date  . COLONOSCOPY  04/25/05  . COLONOSCOPY WITH PROPOFOL N/A 07/22/2015   Procedure: COLONOSCOPY WITH PROPOFOL;  Surgeon: Robert Bellow,  MD;  Location: [facility] ENDOSCOPY;  Service: Endoscopy;  Laterality: N/A;  . CYSTOSCOPY WITH HOLMIUM LASER LITHOTRIPSY Bilateral 07/28/2017   Procedure: CYSTOSCOPY WITH HOLMIUM LASER LITHOTRIPSY;  Surgeon: Lucas Mallow, MD;  Location: [facility] ORS;  Service: Urology;  Laterality: Bilateral;  . CYSTOSCOPY WITH STENT PLACEMENT Bilateral 07/28/2017   Procedure: CYSTOSCOPY WITH STENT PLACEMENT;  Surgeon: Lucas Mallow, MD;  Location: [facility] ORS;  Service: Urology;  Laterality: Bilateral;  . CYSTOSCOPY WITH URETEROSCOPY Bilateral 07/28/2017   Procedure: CYSTOSCOPY WITH URETEROSCOPY;  Surgeon: Lucas Mallow, MD;  Location: [facility] ORS;  Service: Urology;  Laterality: Bilateral;  . HEMORROIDECTOMY    . HERNIA REPAIR Left May 22, 2015   Left inguinal hernia repair with medium Ultra Pro mesh  . INGUINAL HERNIA REPAIR Left 07/22/2015   Procedure: HERNIA REPAIR INGUINAL ADULT;  Surgeon: Robert Bellow, MD;  Location: [facility] ORS;  Service: General;  Laterality: Left;  . LEFT HEART CATH AND CORONARY ANGIOGRAPHY Left 05/29/2017   Procedure: LEFT HEART CATH AND CORONARY ANGIOGRAPHY;  Surgeon: [name], MD;  Location: [facility] [facility] LAB;  Service: Cardiovascular;  Laterality: Left;  . NASAL SINUS SURGERY    . VASECTOMY      Family History  Problem Relation Age of Onset  . Heart failure Mother   . Leukemia Father   . Alcoholism Brother     Social History Social History   Tobacco Use  .  Smoking status: Former Smoker    Packs/day: 1.50    Years: 31.00    Pack years: 46.50    Types: Cigarettes    Quit date: 07/14/2000    Years since quitting: 18.2  . Smokeless tobacco: Never Used  Substance Use Topics  . Alcohol use: No  . Drug use: No    Current Outpatient Medications  Medication Sig Dispense Refill  . amiodarone (PACERONE) 200 MG tablet Take 1 tablet (200 mg total) by mouth daily. 90 tablet 3  . aspirin EC 81 MG tablet Take 1 tablet (81 mg total) by mouth daily. 90 tablet 3  .  atenolol (TENORMIN) 100 MG tablet Take 0.5 tablets (50 mg total) by mouth daily. Please contact the office for a follow up appointment. 45 tablet 3  . gabapentin (NEURONTIN) 300 MG capsule 600 mg.   0  . latanoprost (XALATAN) 0.005 % ophthalmic solution Place 1 drop into both eyes at bedtime.     Marland Kitchen levothyroxine (SYNTHROID, LEVOTHROID) 75 MCG tablet Take 75 mcg by mouth daily before breakfast. Reported on 08/05/2015     Current Facility-Administered Medications  Medication Dose Route Frequency Provider Last Rate Last Dose  . ciprofloxacin (CIPRO) tablet 500 mg  500 mg Oral Once Stoioff, Scott C, MD      . lidocaine (XYLOCAINE) 2 % jelly 1 application  1 application Urethral Once Stoioff, Ronda Fairly, MD        Allergies  Allergen Reactions  . Coumadin [Warfarin] Rash    Review of Systems  Constitutional: Negative for fatigue.  HENT: Negative.   Eyes: Negative.   Respiratory: Positive for shortness of breath.   Cardiovascular: Positive for chest pain and palpitations.  Gastrointestinal: Negative.   Endocrine: Negative.   Genitourinary: Negative.   Musculoskeletal: Negative.   Allergic/Immunologic: Negative.   Neurological: Negative.   Hematological: Negative.   Psychiatric/Behavioral: Negative.     BP 126/79 (BP Location: Right Arm, Patient Position: Sitting, Cuff Size: Normal)   Pulse (!) 58   Temp 98.1 F (36.7 C)   Resp 18   Ht 5\' 10"  (1.778 m)   Wt 178 lb 11.2 oz (81.1 kg)   SpO2 96% Comment: RA  BMI 25.64 kg/m  Physical Exam Constitutional:      Appearance: Normal appearance. He is normal weight.  HENT:     Head: Normocephalic and atraumatic.  Eyes:     Extraocular Movements: Extraocular movements intact.     Conjunctiva/sclera: Conjunctivae normal.     Pupils: Pupils are equal, round, and reactive to light.  Neck:     Musculoskeletal: Normal range of motion and neck supple.     Vascular: No carotid bruit.  Cardiovascular:     Rate and Rhythm: Normal rate and  regular rhythm.     Heart sounds: Normal heart sounds. No murmur.  Pulmonary:     Effort: Pulmonary effort is normal.     Breath sounds: Normal breath sounds.  Musculoskeletal:        General: No swelling.  Lymphadenopathy:     Cervical: No cervical adenopathy.  Skin:    General: Skin is warm and dry.  Neurological:     General: No focal deficit present.     Mental Status: He is alert and oriented to person, place, and time.  Psychiatric:        Mood and Affect: Mood normal.        Behavior: Behavior normal.      Diagnostic Tests:  CLINICAL DATA:  Ascending aortic aneurysm.  EXAM: CT ANGIOGRAPHY CHEST WITH CONTRAST  TECHNIQUE: Multidetector CT imaging of the chest was performed using the standard protocol during bolus administration of intravenous contrast. Multiplanar CT image reconstructions and MIPs were obtained to evaluate the vascular anatomy.  CONTRAST:  57mL OMNIPAQUE IOHEXOL 350 MG/ML SOLN  COMPARISON:  Radiographs of May 23, 2017.  FINDINGS: Cardiovascular: 4.5 cm ascending thoracic aortic aneurysm is noted without dissection. Great vessels are widely patent without significant stenosis. Transverse aortic arch measures 2.8 cm. Proximal descending thoracic aorta measures 2.6 cm. Mild cardiomegaly is noted without pericardial effusion.  Mediastinum/Nodes: No enlarged mediastinal, hilar, or axillary lymph nodes. Thyroid gland, trachea, and esophagus demonstrate no significant findings.  Lungs/Pleura: No pneumothorax or pleural effusion is noted. Emphysematous disease is noted in both lungs. Mild subsegmental atelectasis is noted in the left lingular and lower lobes.  Upper Abdomen: No acute abnormality.  Musculoskeletal: No chest wall abnormality. No acute or significant osseous findings.  Review of the MIP images confirms the above findings.  IMPRESSION: 4.5 cm ascending thoracic aortic aneurysm is noted. Ascending thoracic aortic  aneurysm. Recommend semi-annual imaging followup by CTA or MRA and referral to cardiothoracic surgery if not already obtained. This recommendation follows 2010 ACCF/AHA/AATS/ACR/ASA/SCA/SCAI/SIR/STS/SVM Guidelines for the Diagnosis and Management of Patients With Thoracic Aortic Disease. Circulation. 2010; [id number]. Aortic aneurysm NOS (ICD10-I71.9).  Mild subsegmental atelectasis is noted in the left lingular segment and left lower lobe.  Emphysema (ICD10-J43.9).   Electronically Signed   By: Marijo Conception M.D.   On: 09/06/2018 08:32   Impression:  This 68 year old gentleman has a 4.5 cm fusiform ascending aortic aneurysm.  He has no prior CT scans to compare to.  He did have an echocardiogram in May 2017 which showed the aorta to measure 3.1 cm and a follow-up echocardiogram and 06/2017 measured the aorta at 4.0 cm.  This would suggest that his aorta may be enlarging although comparing echo and CT may be inaccurate.  I reviewed the CTA study with him and answered his questions.  I have recommended that we do a follow-up CTA in 1 year.  I stressed the importance of good blood pressure control and preventing further enlargement and acute aortic dissection.  I recommended that he avoid any heavy lifting of more than 35 pounds which may lead to a Valsalva maneuver and sudden rise in his blood pressure.   Plan:  He will return to see me in 1 year with a CTA of the chest.  I spent 30 minutes performing this consultation and > 50% of this time was spent face to face counseling and coordinating the care of this patient's ascending aortic aneurysm.  Gaye Pollack, MD [facility] [phone]

## 2018-11-08 ENCOUNTER — Ambulatory Visit (INDEPENDENT_AMBULATORY_CARE_PROVIDER_SITE_OTHER): Payer: BC Managed Care – PPO | Admitting: Urology

## 2018-11-08 ENCOUNTER — Encounter: Payer: Self-pay | Admitting: Urology

## 2018-11-08 ENCOUNTER — Other Ambulatory Visit: Payer: Self-pay

## 2018-11-08 VITALS — BP 142/79 | HR 62 | Ht 69.0 in | Wt 180.9 lb

## 2018-11-08 DIAGNOSIS — N200 Calculus of kidney: Secondary | ICD-10-CM | POA: Diagnosis not present

## 2018-11-08 NOTE — Progress Notes (Signed)
11/08/2018 1:36 PM   Bobby Flowers [birth year] [id number]  Referring provider: [name], MD [address] [facility] [place],  [place] [address]  Chief Complaint  Patient presents with  . Nephrolithiasis    Urologic history: 1.  Nephrolithiasis  -Bilateral ureteroscopic stone removal by Dr. Gloriann Loan 07/2017  -Small, nonobstructing left lower pole calculi   HPI: 68 y.o. male presents for annual follow-up of nephrolithiasis.  He denies flank, abdominal, pelvic pain.  He has no bothersome lower urinary tract symptoms and denies gross hematuria.    Last PSA was performed by his PCP June 2018 and was 1.58.   PMH: Past Medical History:  Diagnosis Date  . Chest tightness    a. 05/2017 Cath: nl cors.  . Dilated aortic root ([place])    a. 06/2017 Echo: Ao root 4.4cm.  Marland Kitchen Headache   . Hypothyroidism   . Mitral valve prolapse    a. 09/2015 Echo: EF 55-60%, no rwma, nl LA size, nl RV fxn, nl PASP; b. 06/2017 Echo: EF 55-60%, no rwma, Gr2 DD. Asc Ao 4.4cm. Mild MR (? mild prolapse). Mildly dil LA.  . Nephrolithiasis    a. 07/2017 CT abd: 30mm obstructing distal R ureteral calculus s/p lithotripsy and stenting.  Marland Kitchen PAF (paroxysmal atrial fibrillation) ([place])    a. Dx in 1980's; b. CHA2DS2VASc = 1 - briefly on coumadin many years ago-->caused rash; c. Chronic Amio 200mg  QD.    Surgical History: Past Surgical History:  Procedure Laterality Date  . COLONOSCOPY  04/25/05  . COLONOSCOPY WITH PROPOFOL N/A 07/22/2015   Procedure: COLONOSCOPY WITH PROPOFOL;  Surgeon: Robert Bellow, MD;  Location: [facility] ENDOSCOPY;  Service: Endoscopy;  Laterality: N/A;  . CYSTOSCOPY WITH HOLMIUM LASER LITHOTRIPSY Bilateral 07/28/2017   Procedure: CYSTOSCOPY WITH HOLMIUM LASER LITHOTRIPSY;  Surgeon: Lucas Mallow, MD;  Location: [facility] ORS;  Service: Urology;  Laterality: Bilateral;  . CYSTOSCOPY WITH STENT PLACEMENT Bilateral 07/28/2017   Procedure: CYSTOSCOPY WITH STENT PLACEMENT;  Surgeon: Lucas Mallow, MD;  Location: [facility] ORS;  Service: Urology;  Laterality: Bilateral;  . CYSTOSCOPY WITH URETEROSCOPY Bilateral 07/28/2017   Procedure: CYSTOSCOPY WITH URETEROSCOPY;  Surgeon: Lucas Mallow, MD;  Location: [facility] ORS;  Service: Urology;  Laterality: Bilateral;  . HEMORROIDECTOMY    . HERNIA REPAIR Left May 22, 2015   Left inguinal hernia repair with medium Ultra Pro mesh  . INGUINAL HERNIA REPAIR Left 07/22/2015   Procedure: HERNIA REPAIR INGUINAL ADULT;  Surgeon: Robert Bellow, MD;  Location: [facility] ORS;  Service: General;  Laterality: Left;  . LEFT HEART CATH AND CORONARY ANGIOGRAPHY Left 05/29/2017   Procedure: LEFT HEART CATH AND CORONARY ANGIOGRAPHY;  Surgeon: [name], MD;  Location: [facility];  Service: Cardiovascular;  Laterality: Left;  . NASAL SINUS SURGERY    . VASECTOMY      Home Medications:  Allergies as of 11/08/2018      Reactions   Coumadin [warfarin] Rash      Medication List       Accurate as of November 08, 2018  1:36 PM. If you have any questions, ask your nurse or doctor.        amiodarone 200 MG tablet Commonly known as: PACERONE Take 1 tablet (200 mg total) by mouth daily.   aspirin EC 81 MG tablet Take 1 tablet (81 mg total) by mouth daily.   atenolol 100 MG tablet Commonly known as: TENORMIN Take 0.5 tablets (50  mg total) by mouth daily. Please contact the office for a follow up appointment.   gabapentin 300 MG capsule Commonly known as: NEURONTIN 600 mg.   latanoprost 0.005 % ophthalmic solution Commonly known as: XALATAN Place 1 drop into both eyes at bedtime.   levothyroxine 75 MCG tablet Commonly known as: SYNTHROID Take 75 mcg by mouth daily before breakfast. Reported on 08/05/2015   meloxicam 15 MG tablet Commonly known as: MOBIC Take 15 mg by mouth daily.       Allergies:  Allergies  Allergen Reactions  . Coumadin [Warfarin] Rash    Family History: Family History  Problem Relation Age of Onset   . Heart failure Mother   . Leukemia Father   . Alcoholism Brother     Social History:  reports that he quit smoking about 18 years ago. His smoking use included cigarettes. He has a 46.50 pack-year smoking history. He has never used smokeless tobacco. He reports that he does not drink alcohol or use drugs.  ROS: UROLOGY Frequent Urination?: No Hard to postpone urination?: No Burning/pain with urination?: No Get up at night to urinate?: No Leakage of urine?: No Urine stream starts and stops?: No Trouble starting stream?: No Do you have to strain to urinate?: No Blood in urine?: No Urinary tract infection?: No Sexually transmitted disease?: No Injury to kidneys or bladder?: No Painful intercourse?: No Weak stream?: No Erection problems?: No Penile pain?: No  Gastrointestinal Nausea?: No Vomiting?: No Indigestion/heartburn?: No Diarrhea?: No Constipation?: No  Constitutional Fever: No Night sweats?: No Weight loss?: No Fatigue?: No  Skin Skin rash/lesions?: No Itching?: No  Eyes Blurred vision?: No Double vision?: No  Ears/Nose/Throat Sore throat?: No Sinus problems?: No  Hematologic/Lymphatic Swollen glands?: No Easy bruising?: No  Cardiovascular Leg swelling?: No Chest pain?: No  Respiratory Cough?: No Shortness of breath?: No  Endocrine Excessive thirst?: No  Musculoskeletal Back pain?: No Joint pain?: No  Neurological Headaches?: No Dizziness?: No  Psychologic Depression?: No Anxiety?: No  Physical Exam: BP (!) 142/79 (BP Location: Left Arm, Patient Position: Sitting, Cuff Size: Normal)   Pulse 62   Ht 5\' 9"  (1.753 m)   Wt 180 lb 14.4 oz (82.1 kg)   BMI 26.71 kg/m   Constitutional:  Alert and oriented, No acute distress. HEENT: [place] AT, moist mucus membranes.  Trachea midline, no masses. Cardiovascular: No clubbing, cyanosis, or edema. Respiratory: Normal respiratory effort, no increased work of breathing. Skin: No rashes,  bruises or suspicious lesions. Neurologic: Grossly intact, no focal deficits, moving all 4 extremities. Psychiatric: Normal mood and affect.   Assessment & Plan:    - Nephrolithiasis Asymptomatic.  KUB was ordered and he will be notified with results.  If stable continue annual follow-up.  - Prostate cancer screening His PSA has been low and stable with the last one in 2018.  We discussed current recommendation for prostate cancer screening guidelines up until age 59.  He stated he was close enough to 67 and has elected to discontinue screening at this time.   Abbie Sons, [place] [address] [place], [place] [address] [phone]

## 2018-11-09 ENCOUNTER — Ambulatory Visit: Payer: BLUE CROSS/BLUE SHIELD | Admitting: Urology

## 2018-11-09 ENCOUNTER — Encounter: Payer: Self-pay | Admitting: Urology

## 2018-11-09 DIAGNOSIS — N200 Calculus of kidney: Secondary | ICD-10-CM | POA: Insufficient documentation

## 2018-12-06 ENCOUNTER — Other Ambulatory Visit: Payer: Self-pay

## 2018-12-06 ENCOUNTER — Encounter: Payer: Self-pay | Admitting: *Deleted

## 2018-12-06 NOTE — Discharge Instructions (Signed)
Bobby Flowers DISCHARGE INSTRUCTIONS FOR MYRINGOTOMY AND TUBE INSERTION  [facility] [name], M.D. [name], M.D. [name], M.D. [name], M.D.  Diet:   After surgery, the patient should take only liquids and foods as tolerated.  The patient may then have a regular diet after the effects of anesthesia have worn off, usually about four to six hours after surgery.  Activities:   The patient should rest until the effects of anesthesia have worn off.  After this, there are no restrictions on the normal daily activities.  Medications:   You will be given antibiotic drops to be used in the ears postoperatively.  It is recommended to use 4 drops 3 times a day for 3 days, then the drops should be saved for possible future use.  The tubes should not cause any discomfort to the patient, but if there is any question, Tylenol should be given according to the instructions for the age of the patient.  Other medications should be continued normally.  Precautions:   Should there be recurrent drainage after the tubes are placed, the drops should be used for approximately 3-4 days.  If it does not clear, you should call the ENT office.  Earplugs:   Earplugs are only needed for those who are going to be submerged under water.  When taking a bath or shower and using a cup or showerhead to rinse hair, it is not necessary to wear earplugs.  These come in a variety of fashions, all of which can be obtained at our office.  However, if one is not able to come by the office, then silicone plugs can be found at most pharmacies.  It is not advised to stick anything in the ear that is not approved as an earplug.  Silly putty is not to be used as an earplug.  Swimming is allowed in patients after ear tubes are inserted, however, they must wear earplugs if they are going to be submerged under water.  For those children who are going to be swimming a lot, it is  recommended to use a fitted ear mold, which can be made by our audiologist.  If discharge is noticed from the ears, this most likely represents an ear infection.  We would recommend getting your eardrops and using them as indicated above.  If it does not clear, then you should call the ENT office.  For follow up, the patient should return to the ENT office three weeks postoperatively and then every six months as required by the doctor.   General Anesthesia, Adult, Care After This sheet gives you information about how to care for yourself after your procedure. Your health care provider may also give you more specific instructions. If you have problems or questions, contact your health care provider. What can I expect after the procedure? After the procedure, the following side effects are common:  Pain or discomfort at the IV site.  Nausea.  Vomiting.  Sore throat.  Trouble concentrating.  Feeling cold or chills.  Weak or tired.  Sleepiness and fatigue.  Soreness and body aches. These side effects can affect parts of the body that were not involved in surgery. Follow these instructions at home:  For at least 24 hours after the procedure:  Have a responsible adult stay with you. It is important to have someone help care for you until you are awake and alert.  Rest as needed.  Do not: ? Participate in  activities in which you could fall or become injured. °? Drive. °? Use heavy machinery. °? Drink alcohol. °? Take sleeping pills or medicines that cause drowsiness. °? Make important decisions or sign legal documents. °? Take care of children on your own. °Eating and drinking °· Follow any instructions from your health care provider about eating or drinking restrictions. °· When you feel hungry, start by eating small amounts of foods that are soft and easy to digest (bland), such as toast. Gradually return to your regular diet. °· Drink enough fluid to keep your urine pale yellow. °· If  you vomit, rehydrate by drinking water, juice, or clear broth. °General instructions °· If you have sleep apnea, surgery and certain medicines can increase your risk for breathing problems. Follow instructions from your health care provider about wearing your sleep device: °? Anytime you are sleeping, including during daytime naps. °? While taking prescription pain medicines, sleeping medicines, or medicines that make you drowsy. °· Return to your normal activities as told by your health care provider. Ask your health care provider what activities are safe for you. °· Take over-the-counter and prescription medicines only as told by your health care provider. °· If you smoke, do not smoke without supervision. °· Keep all follow-up visits as told by your health care provider. This is important. °Contact a health care provider if: °· You have nausea or vomiting that does not get better with medicine. °· You cannot eat or drink without vomiting. °· You have pain that does not get better with medicine. °· You are unable to pass urine. °· You develop a skin rash. °· You have a fever. °· You have redness around your IV site that gets worse. °Get help right away if: °· You have difficulty breathing. °· You have chest pain. °· You have blood in your urine or stool, or you vomit blood. °Summary °· After the procedure, it is common to have a sore throat or nausea. It is also common to feel tired. °· Have a responsible adult stay with you for the first 24 hours after general anesthesia. It is important to have someone help care for you until you are awake and alert. °· When you feel hungry, start by eating small amounts of foods that are soft and easy to digest (bland), such as toast. Gradually return to your regular diet. °· Drink enough fluid to keep your urine pale yellow. °· Return to your normal activities as told by your health care provider. Ask your health care provider what activities are safe for you. °This  information is not intended to replace advice given to you by your health care provider. Make sure you discuss any questions you have with your health care provider. °Document Released: 05/16/2000 Document Revised: 02/10/2017 Document Reviewed: 09/23/2016 °Elsevier Patient Education © 2020 [employer] Inc[employer] ° °

## 2018-12-10 ENCOUNTER — Other Ambulatory Visit
Admission: RE | Admit: 2018-12-10 | Discharge: 2018-12-10 | Disposition: A | Discharge status: Home / Self Care | Payer: BC Managed Care – PPO | Source: Ambulatory Visit | Attending: Otolaryngology | Admitting: Otolaryngology

## 2018-12-10 ENCOUNTER — Other Ambulatory Visit: Payer: Self-pay

## 2018-12-10 DIAGNOSIS — Z20828 Contact with and (suspected) exposure to other viral communicable diseases: Secondary | ICD-10-CM | POA: Diagnosis not present

## 2018-12-10 DIAGNOSIS — Z01812 Encounter for preprocedural laboratory examination: Secondary | ICD-10-CM | POA: Insufficient documentation

## 2018-12-11 LAB — SARS CORONAVIRUS 2 (TAT 6-24 HRS): SARS Coronavirus 2: NEGATIVE

## 2018-12-13 ENCOUNTER — Encounter
Admission: RE | Disposition: A | Discharge status: Home / Self Care | Payer: Self-pay | Source: Home / Self Care | Attending: Otolaryngology

## 2018-12-13 ENCOUNTER — Ambulatory Visit
Admission: RE | Admit: 2018-12-13 | Discharge: 2018-12-13 | Disposition: A | Discharge status: Home / Self Care | Payer: BC Managed Care – PPO | Attending: Otolaryngology | Admitting: Otolaryngology

## 2018-12-13 ENCOUNTER — Ambulatory Visit: Payer: BC Managed Care – PPO | Admitting: Anesthesiology

## 2018-12-13 ENCOUNTER — Other Ambulatory Visit: Payer: Self-pay

## 2018-12-13 DIAGNOSIS — Z7982 Long term (current) use of aspirin: Secondary | ICD-10-CM | POA: Insufficient documentation

## 2018-12-13 DIAGNOSIS — H6982 Other specified disorders of Eustachian tube, left ear: Secondary | ICD-10-CM | POA: Insufficient documentation

## 2018-12-13 DIAGNOSIS — Z79899 Other long term (current) drug therapy: Secondary | ICD-10-CM | POA: Diagnosis not present

## 2018-12-13 DIAGNOSIS — Z79890 Hormone replacement therapy (postmenopausal): Secondary | ICD-10-CM | POA: Diagnosis not present

## 2018-12-13 DIAGNOSIS — H6980 Other specified disorders of Eustachian tube, unspecified ear: Secondary | ICD-10-CM | POA: Diagnosis present

## 2018-12-13 HISTORY — PX: NASOPHARYNGOSCOPY EUSTATION TUBE BALLOON DILATION: SHX6729

## 2018-12-13 HISTORY — PX: MYRINGOTOMY WITH TUBE PLACEMENT: SHX5663

## 2018-12-13 HISTORY — DX: Presence of spectacles and contact lenses: Z97.3

## 2018-12-13 SURGERY — MYRINGOTOMY WITH TUBE PLACEMENT
Anesthesia: General | Site: Nose | Laterality: Left

## 2018-12-13 MED ORDER — DEXAMETHASONE SODIUM PHOSPHATE 4 MG/ML IJ SOLN
INTRAMUSCULAR | Status: DC | PRN
  Administered 2018-12-13: 10 mg via INTRAVENOUS

## 2018-12-13 MED ORDER — LACTATED RINGERS IV SOLN
100.0000 mL/h | INTRAVENOUS | Status: DC
  Administered 2018-12-13: 07:00:00 100 mL/h via INTRAVENOUS

## 2018-12-13 MED ORDER — ONDANSETRON HCL 4 MG/2ML IJ SOLN
INTRAMUSCULAR | Status: DC | PRN
  Administered 2018-12-13: 4 mg via INTRAVENOUS

## 2018-12-13 MED ORDER — LIDOCAINE HCL (CARDIAC) PF 100 MG/5ML IV SOSY
PREFILLED_SYRINGE | INTRAVENOUS | Status: DC | PRN
  Administered 2018-12-13: 50 mg via INTRATRACHEAL

## 2018-12-13 MED ORDER — CIPROFLOXACIN-DEXAMETHASONE 0.3-0.1 % OT SUSP
OTIC | Status: DC | PRN
  Administered 2018-12-13: 4 [drp] via OTIC

## 2018-12-13 MED ORDER — PROPOFOL 10 MG/ML IV BOLUS
INTRAVENOUS | Status: DC | PRN
  Administered 2018-12-13: 140 mg via INTRAVENOUS

## 2018-12-13 MED ORDER — MIDAZOLAM HCL 5 MG/5ML IJ SOLN
INTRAMUSCULAR | Status: DC | PRN
  Administered 2018-12-13: 2 mg via INTRAVENOUS

## 2018-12-13 MED ORDER — PHENYLEPHRINE HCL 0.5 % NA SOLN
NASAL | Status: DC | PRN
  Administered 2018-12-13: 30 mL via TOPICAL

## 2018-12-13 MED ORDER — HYDROCODONE-ACETAMINOPHEN 5-325 MG PO TABS: 1.0000 | ORAL_TABLET | ORAL | 0 refills | Status: AC | PRN

## 2018-12-13 MED ORDER — EPHEDRINE SULFATE 50 MG/ML IJ SOLN
INTRAMUSCULAR | Status: DC | PRN
  Administered 2018-12-13: 10 mg via INTRAVENOUS
  Administered 2018-12-13: 5 mg via INTRAVENOUS
  Administered 2018-12-13: 10 mg via INTRAVENOUS

## 2018-12-13 MED ORDER — FENTANYL CITRATE (PF) 100 MCG/2ML IJ SOLN
INTRAMUSCULAR | Status: DC | PRN
  Administered 2018-12-13: 50 ug via INTRAVENOUS

## 2018-12-13 MED ORDER — ONDANSETRON HCL 4 MG PO TABS: 4.0000 mg | ORAL_TABLET | Freq: Three times a day (TID) | ORAL | 0 refills | Status: DC | PRN

## 2018-12-13 MED ORDER — GLYCOPYRROLATE 0.2 MG/ML IJ SOLN
INTRAMUSCULAR | Status: DC | PRN
  Administered 2018-12-13: 0.1 mg via INTRAVENOUS

## 2018-12-13 SURGICAL SUPPLY — 19 items
BALLN CATH EUST TUBE 6X16 (BALLOONS) ×4
BLADE MYR LANCE NRW W/HDL (BLADE) ×4 IMPLANT
CANISTER SUCT 1200ML W/VALVE (MISCELLANEOUS) ×4 IMPLANT
CATH BALLOON EUST TUBE 6X16 (BALLOONS) ×2 IMPLANT
COTTONBALL LRG STERILE PKG (GAUZE/BANDAGES/DRESSINGS) ×4 IMPLANT
DEVICE INFLATION SEID (MISCELLANEOUS) ×4 IMPLANT
DRAPE HEAD BAR (DRAPES) ×4 IMPLANT
ELECT REM PT RETURN 9FT ADLT (ELECTROSURGICAL) ×4
ELECTRODE REM PT RTRN 9FT ADLT (ELECTROSURGICAL) ×2 IMPLANT
GLOVE BIO SURGEON STRL SZ7.5 (GLOVE) ×8 IMPLANT
GOWN STRL REUS W/ TWL LRG LVL3 (GOWN DISPOSABLE) ×2 IMPLANT
GOWN STRL REUS W/TWL LRG LVL3 (GOWN DISPOSABLE) ×2
NS IRRIG 500ML POUR BTL (IV SOLUTION) ×4 IMPLANT
PACK ENT CUSTOM (PACKS) ×4 IMPLANT
PATTIES SURGICAL .5 X3 (DISPOSABLE) ×4 IMPLANT
SOL ANTI-FOG 6CC FOG-OUT (MISCELLANEOUS) ×2 IMPLANT
SOL FOG-OUT ANTI-FOG 6CC (MISCELLANEOUS) ×2
STRAP BODY AND KNEE 60X3 (MISCELLANEOUS) ×4 IMPLANT
TUBE EAR T 1.27X5.3 BFLY (OTOLOGIC RELATED) ×2 IMPLANT

## 2018-12-13 NOTE — Anesthesia Preprocedure Evaluation (Signed)
Anesthesia Evaluation  Patient identified by MRN, date of birth, ID band Patient awake    Reviewed: Allergy & Precautions, H&P , NPO status , Patient's Chart, lab work & pertinent test results  Airway Mallampati: II  TM Distance: >3 FB Neck ROM: full    Dental no notable dental hx.    Pulmonary former smoker,    Pulmonary exam normal breath sounds clear to auscultation       Cardiovascular + angina Atrial Fibrillation + Valvular Problems/Murmurs MVP  Rhythm:regular Rate:Normal     Neuro/Psych    GI/Hepatic   Endo/Other  Hypothyroidism   Renal/GU      Musculoskeletal   Abdominal   Peds  Hematology   Anesthesia Other Findings   Reproductive/Obstetrics                             Anesthesia Physical Anesthesia Plan  ASA: III  Anesthesia Plan: General LMA   Post-op Pain Management:    Induction:   PONV Risk Score and Plan: 2 and Ondansetron, Dexamethasone and Treatment may vary due to age or medical condition  Airway Management Planned:   Additional Equipment:   Intra-op Plan:   Post-operative Plan:   Informed Consent: I have reviewed the patients History and Physical, chart, labs and discussed the procedure including the risks, benefits and alternatives for the proposed anesthesia with the patient or authorized representative who has indicated his/her understanding and acceptance.       Plan Discussed with: CRNA  Anesthesia Plan Comments:         Anesthesia Quick Evaluation

## 2018-12-13 NOTE — Op Note (Signed)
..  12/13/2018  8:08 AM    Holli Humbles  [id number]   Pre-Op Dx:  Verdie Drown tube dysfunction  Post-op Dx: Eustachian tube dysfunction  Proc: 1)  Nasopharyngoscopy  2)  Left turbinate outfracture  3)  Left myringotomy and BUTTERFLY tube placement  Surg: Chardai Gangemi  Anes:  General by laryngeal mask airway  EBL:  None  Comp:  None  Findings:  Left mildly retracted drum.  Butterfly tube placed posterior inferiorly.  Successful dilation of left eustachian tube  Procedure: With the patient in a comfortable supine position, general laryngeal mask anesthesia was administered.  At an appropriate level, microscope and speculum were used to examine and clean the patient's LEFT ear canal.  The findings were as described above.  An posterior inferior radial myringotomy incision was sharply executed.  Middle ear contents were suctioned clear with a size 5 otologic suction.  A BUTTERFLY PE tube was placed without difficulty using a Rosen pick and Animal nutritionist.  Ciprodex otic solution was instilled into the external canal, and insufflated into the middle ear.  A cotton ball was placed at the external meatus. Hemostasis was observed.  This side was completed.  After completing, attention was directed to the nasopharynx and the eustachian tube dilation.  A zero degree endoscope was inserted into the patient's left nasal cavity.  Afrin soaked pledgets were removed and correct in number.  The left inferior turbinate was outfractured with Soil scientist.  The eustachian tube opening was identified with nasopharyngoscopy and found to be without mass or lesion.  The Acclarent Eustachian tuboplasty balloon device was brought onto the field.  The dilator tip was advanced under endoscopic visualization into the left eustachian tube orifice without difficulty until the entire balloon was in the eustachian tube.  This was inflated to 12cm H20 for 2 minutes.  The balloon was deflated and gently removed.  No  bleeding was noted.  Following this, the patient was returned to anesthesia, awakened, and transferred to recovery in stable condition.  Dispo:  PACU to home  Plan: Routine drop use and water precautions.  Recheck my office 1 month   Deni Lefever 8:08 AM 12/13/2018

## 2018-12-13 NOTE — H&P (Signed)
..  History and Physical paper copy reviewed and updated date of procedure and will be scanned into system.  Patient seen and examined.  

## 2018-12-13 NOTE — Anesthesia Procedure Notes (Signed)
Procedure Name: LMA Insertion Date/Time: 12/13/2018 7:43 AM Performed by: Mayme Genta, CRNA Pre-anesthesia Checklist: Patient identified, Emergency Drugs available, Suction available, Timeout performed and Patient being monitored Patient Re-evaluated:Patient Re-evaluated prior to induction Oxygen Delivery Method: Circle system utilized Preoxygenation: Pre-oxygenation with 100% oxygen Induction Type: IV induction LMA: LMA inserted LMA Size: 4.0 Number of attempts: 1 Placement Confirmation: positive ETCO2 and breath sounds checked- equal and bilateral Tube secured with: Tape

## 2018-12-13 NOTE — Anesthesia Postprocedure Evaluation (Signed)
Anesthesia Post Note  Patient: Bobby Flowers  Procedure(s) Performed: MYRINGOTOMY WITH T-TUBE PLACEMENT (Left Ear) NASOPHARYNGOSCOPY EUSTATION TUBE BALLOON DILATION WITH OUTFRACTURE OF TURBINATES (Left Nose)  Patient location during evaluation: PACU Anesthesia Type: General Level of consciousness: awake and alert and oriented Pain management: satisfactory to patient Vital Signs Assessment: post-procedure vital signs reviewed and stable Respiratory status: spontaneous breathing, nonlabored ventilation and respiratory function stable Cardiovascular status: blood pressure returned to baseline and stable Postop Assessment: Adequate PO intake and No signs of nausea or vomiting Anesthetic complications: no    Raliegh Ip

## 2018-12-13 NOTE — Transfer of Care (Signed)
Immediate Anesthesia Transfer of Care Note  Patient: Bobby Flowers  Procedure(s) Performed: MYRINGOTOMY WITH T-TUBE PLACEMENT (Left Ear) NASOPHARYNGOSCOPY EUSTATION TUBE BALLOON DILATION WITH OUTFRACTURE OF TURBINATES (Left Nose)  Patient Location: PACU  Anesthesia Type: General LMA  Level of Consciousness: awake, alert  and patient cooperative  Airway and Oxygen Therapy: Patient Spontanous Breathing and Patient connected to supplemental oxygen  Post-op Assessment: Post-op Vital signs reviewed, Patient's Cardiovascular Status Stable, Respiratory Function Stable, Patent Airway and No signs of Nausea or vomiting  Post-op Vital Signs: Reviewed and stable  Complications: No apparent anesthesia complications

## 2018-12-14 ENCOUNTER — Encounter: Payer: Self-pay | Admitting: Otolaryngology

## 2019-01-06 ENCOUNTER — Other Ambulatory Visit: Payer: Self-pay | Admitting: Urology

## 2019-01-06 DIAGNOSIS — N200 Calculus of kidney: Secondary | ICD-10-CM

## 2019-01-24 ENCOUNTER — Other Ambulatory Visit: Payer: Self-pay | Admitting: Cardiovascular Disease

## 2019-04-07 ENCOUNTER — Other Ambulatory Visit: Payer: Self-pay | Admitting: Podiatry

## 2019-04-08 ENCOUNTER — Other Ambulatory Visit: Payer: Self-pay | Admitting: Podiatry

## 2019-04-08 MED ORDER — MELOXICAM 15 MG PO TABS: 15.0000 mg | ORAL_TABLET | Freq: Every day | ORAL | 1 refills | Status: DC

## 2019-04-08 NOTE — Telephone Encounter (Signed)
Pt hasn't been seen since March of 2020

## 2019-04-08 NOTE — Telephone Encounter (Signed)
Rx sent. Please inform patient.  - Dr. Amalia Hailey

## 2019-04-22 ENCOUNTER — Telehealth: Payer: Self-pay | Admitting: Cardiovascular Disease

## 2019-04-22 ENCOUNTER — Other Ambulatory Visit: Payer: Self-pay

## 2019-04-22 MED ORDER — ATENOLOL 100 MG PO TABS: 50.0000 mg | ORAL_TABLET | Freq: Every day | ORAL | 1 refills | Status: DC

## 2019-04-22 NOTE — Telephone Encounter (Signed)
*  STAT* If patient is at the pharmacy, call can be transferred to refill team.   1. Which medications need to be refilled? (please list name of each medication and dose if known) atenolol 100 MG 0.5 tablet daily   2. Which pharmacy/location (including street and city if local pharmacy) is medication to be sent to? [facility] in [place]   3. Do they need a 30 day or 90 day supply? 30 day

## 2019-05-20 ENCOUNTER — Other Ambulatory Visit: Payer: Self-pay | Admitting: Physical Medicine and Rehabilitation

## 2019-05-20 DIAGNOSIS — M5416 Radiculopathy, lumbar region: Secondary | ICD-10-CM

## 2019-05-27 ENCOUNTER — Other Ambulatory Visit: Payer: Self-pay

## 2019-05-27 ENCOUNTER — Ambulatory Visit
Admission: RE | Admit: 2019-05-27 | Discharge: 2019-05-27 | Disposition: A | Discharge status: Home / Self Care | Payer: Medicare HMO | Source: Ambulatory Visit | Attending: Physical Medicine and Rehabilitation | Admitting: Physical Medicine and Rehabilitation

## 2019-05-27 DIAGNOSIS — M5416 Radiculopathy, lumbar region: Secondary | ICD-10-CM | POA: Insufficient documentation

## 2019-05-28 ENCOUNTER — Telehealth: Payer: Self-pay | Admitting: Cardiovascular Disease

## 2019-05-28 NOTE — Telephone Encounter (Signed)
Message fwd to Dr. Arida to advise. 

## 2019-05-28 NOTE — Telephone Encounter (Signed)
[facility] Dr. Valetta Fuller office calling on behalf of patient to see if he would be able to take celebrex. Office states it would be for inflamation and they want to know if it would be okay to take daily  Please advise at [phone]

## 2019-05-28 NOTE — Telephone Encounter (Signed)
Celebrex is fine from my standpoint.

## 2019-05-29 NOTE — Telephone Encounter (Signed)
Attempted to return the call to the phone number provided for Dr. Valetta Fuller office [phone]. Phone rings out.

## 2019-05-29 NOTE — Telephone Encounter (Signed)
Spoke with [facility] Dr. Valetta Fuller office and made her aware of Dr. Fletcher Anon response.

## 2019-06-05 ENCOUNTER — Other Ambulatory Visit: Payer: Self-pay | Admitting: Neurosurgery

## 2019-06-21 ENCOUNTER — Encounter
Admission: RE | Admit: 2019-06-21 | Discharge: 2019-06-21 | Disposition: A | Discharge status: Home / Self Care | Payer: Medicare HMO | Source: Ambulatory Visit | Attending: Neurosurgery | Admitting: Neurosurgery

## 2019-06-21 ENCOUNTER — Other Ambulatory Visit: Payer: Self-pay

## 2019-06-21 NOTE — Patient Instructions (Signed)
INSTRUCTIONS FOR SURGERY     Your surgery is scheduled for:   Monday MAY 10th     To find out your arrival time for the day of surgery,          please call [phone] between 1 pm and 3 pm on :  Friday, MAY 7th     When you arrive for surgery, report to the [facility].       Do NOT stop on the first floor to register.    REMEMBER: Instructions that are not followed completely may result in serious medical risk,  up to and including death, or upon the discretion of your surgeon and anesthesiologist,            your surgery may need to be rescheduled.  __X__ 1. Do not eat food after midnight the night before your procedure.                    No gum, candy, lozenger, tic tacs, tums or hard candies.                  ABSOLUTELY NOTHING SOLID IN YOUR MOUTH AFTER MIDNIGHT                    You may drink unlimited clear liquids up to 2 hours before you are scheduled to arrive for surgery.                   Do not drink anything within those 2 hours unless you need to take medicine, then take the                   smallest amount you need.  Clear liquids include:  water, apple juice without pulp,                   any flavor Gatorade, Black coffee, black tea.  Sugar may be added but no dairy/ honey /lemon.                        Broth and jello is not considered a clear liquid.  __x__  2. On the morning of surgery, please brush your teeth with toothpaste and water. You may rinse with                  mouthwash if you wish but DO NOT SWALLOW TOOTHPASTE OR MOUTHWASH  __X___3. NO alcohol for 24 hours before or after surgery.  __x___ 4.  Do NOT smoke or use e-cigarettes for 24 HOURS PRIOR TO SURGERY.                      DO NOT Use any chewable tobacco products for at least 6 hours prior to surgery.  __x___ 5. If you start any new medication after this appointment and prior to surgery, please                    Bring it with you on the day of surgery.  ___x__ 6. Notify your doctor if there is any change in  your medical condition, such as fever,                  infection, vomitting,diarrhea or any open sores.  __x___ 7.  USE the CHG SOAP as instructed, the night before surgery and the day of surgery.                   Once you have washed with this soap, do NOT use any of the following: Powders, perfumes                    or lotions. Please do not wear make up, hairpins, clips or nail polish. You MAY  NOT wear deodorant.                   Men may shave their face and neck.  Women need to shave 48 hours prior to surgery.                   DO NOT wear ANY jewelry on the day of surgery. If there are rings that are too tight to                    remove easily, please address this prior to the surgery day. Piercings need to be removed.                                                                     NO METAL ON YOUR BODY.                    Do NOT bring any valuables.  If you came to Pre-Admit testing then you will not need license,                     insurance card or credit card.  If you will be staying overnight, please either leave your things in                     the car or have your family be responsible for these items.                     [facility] IS NOT RESPONSIBLE FOR BELONGINGS OR VALUABLES.  ___X__ 8. DO NOT wear contact lenses on surgery day.  You may not have dentures,                     Hearing aides, contacts or glasses in the operating room. These items can be                    Placed in the Recovery Room to receive immediately after surgery.  __x___ 9. IF YOU ARE SCHEDULED TO GO HOME ON THE SAME DAY, YOU MUST                   Have someone to drive you home and to stay with you  for the first 24 hours.                    Have an arrangement prior to arriving on surgery day.  ___x__ 10. Take the following medications on  the morning of surgery with a sip of water:                               1. AMIODARONE                     2. ATENOLOL                     3. GABAPENTIN                     4. SYNTHROID                     5.                   _____ 11.  Follow any instructions provided to you by your surgeon.                        Such as enema, clear liquid bowel prep  __X__  12. STOP ASPIRIN ONE WEEK BEFORE SURGERY.  STOP ALL                       ASPIRIN PRODUCTS ALSO. THIS INCLUDES BC POWDERS / GOODIES POWDER  __x___ 13. STOP Anti-inflammatories as of: ONE WEEK BEFORE SURGERY                      This includes IBUPROFEN / MOTRIN / ADVIL / ALEVE/ NAPROXYN / CELEBREX / MOBIC                    YOU MAY TAKE TYLENOL ANY TIME PRIOR TO SURGERY.  __X____18. If staying overnight, please have appropriate shoes to wear to be able to walk around the unit.                   Wear clean and comfortable clothing to the hospital.  [place]. BRING PHONE NUMBERS FOR CONTACT PEOPLE. HAVE A CELL PHONE CHARGER IN CASE YOU DO STAY OVERNIGHT.

## 2019-06-27 ENCOUNTER — Other Ambulatory Visit: Payer: Self-pay

## 2019-06-27 ENCOUNTER — Encounter
Admission: RE | Admit: 2019-06-27 | Discharge: 2019-06-27 | Disposition: A | Discharge status: Home / Self Care | Payer: Medicare HMO | Source: Ambulatory Visit | Attending: Neurosurgery | Admitting: Neurosurgery

## 2019-06-27 DIAGNOSIS — Z01818 Encounter for other preprocedural examination: Secondary | ICD-10-CM | POA: Insufficient documentation

## 2019-06-27 DIAGNOSIS — R9431 Abnormal electrocardiogram [ECG] [EKG]: Secondary | ICD-10-CM | POA: Diagnosis not present

## 2019-06-27 DIAGNOSIS — Z20822 Contact with and (suspected) exposure to covid-19: Secondary | ICD-10-CM | POA: Insufficient documentation

## 2019-06-27 DIAGNOSIS — R001 Bradycardia, unspecified: Secondary | ICD-10-CM | POA: Diagnosis not present

## 2019-06-27 DIAGNOSIS — I440 Atrioventricular block, first degree: Secondary | ICD-10-CM | POA: Diagnosis not present

## 2019-06-27 LAB — CBC
HCT: 47.9 % (ref 39.0–52.0)
Hemoglobin: 16 g/dL (ref 13.0–17.0)
MCH: 29.9 pg (ref 26.0–34.0)
MCHC: 33.4 g/dL (ref 30.0–36.0)
MCV: 89.5 fL (ref 80.0–100.0)
Platelets: 195 10*3/uL (ref 150–400)
RBC: 5.35 MIL/uL (ref 4.22–5.81)
RDW: 13.1 % (ref 11.5–15.5)
WBC: 8.8 10*3/uL (ref 4.0–10.5)
nRBC: 0 % (ref 0.0–0.2)

## 2019-06-27 LAB — URINALYSIS, ROUTINE W REFLEX MICROSCOPIC
Bilirubin Urine: NEGATIVE
Glucose, UA: NEGATIVE mg/dL
Hgb urine dipstick: NEGATIVE
Ketones, ur: NEGATIVE mg/dL
Leukocytes,Ua: NEGATIVE
Nitrite: NEGATIVE
Protein, ur: NEGATIVE mg/dL
Specific Gravity, Urine: 1.013 (ref 1.005–1.030)
pH: 5 (ref 5.0–8.0)

## 2019-06-27 LAB — BASIC METABOLIC PANEL
Anion gap: 7 (ref 5–15)
BUN: 17 mg/dL (ref 8–23)
CO2: 31 mmol/L (ref 22–32)
Calcium: 9.1 mg/dL (ref 8.9–10.3)
Chloride: 103 mmol/L (ref 98–111)
Creatinine, Ser: 0.97 mg/dL (ref 0.61–1.24)
GFR calc Af Amer: >60 mL/min (ref >60)
GFR calc non Af Amer: >60 mL/min (ref >60)
Glucose, Bld: 80 mg/dL (ref 70–99)
Potassium: 4.3 mmol/L (ref 3.5–5.1)
Sodium: 141 mmol/L (ref 135–145)

## 2019-06-27 LAB — SURGICAL PCR SCREEN
MRSA, PCR: NEGATIVE
Staphylococcus aureus: NEGATIVE

## 2019-06-27 LAB — TYPE AND SCREEN
ABO/RH(D): A POS
Antibody Screen: NEGATIVE

## 2019-06-27 LAB — SARS CORONAVIRUS 2 (TAT 6-24 HRS): SARS Coronavirus 2: NEGATIVE

## 2019-06-27 LAB — PROTIME-INR
INR: 1 (ref 0.8–1.2)
Prothrombin Time: 12.5 seconds (ref 11.4–15.2)

## 2019-06-27 LAB — APTT: aPTT: 33 seconds (ref 24–36)

## 2019-07-01 ENCOUNTER — Other Ambulatory Visit: Payer: Self-pay

## 2019-07-01 ENCOUNTER — Ambulatory Visit: Payer: Medicare HMO

## 2019-07-01 ENCOUNTER — Ambulatory Visit: Payer: Medicare HMO | Admitting: Anesthesiology

## 2019-07-01 ENCOUNTER — Encounter: Payer: Self-pay | Admitting: Neurosurgery

## 2019-07-01 ENCOUNTER — Ambulatory Visit
Admission: RE | Admit: 2019-07-01 | Discharge: 2019-07-01 | Disposition: A | Discharge status: Home / Self Care | Payer: Medicare HMO | Attending: Neurosurgery | Admitting: Neurosurgery

## 2019-07-01 ENCOUNTER — Encounter
Admission: RE | Disposition: A | Discharge status: Home / Self Care | Payer: Self-pay | Source: Home / Self Care | Attending: Neurosurgery

## 2019-07-01 DIAGNOSIS — Z419 Encounter for procedure for purposes other than remedying health state, unspecified: Secondary | ICD-10-CM

## 2019-07-01 DIAGNOSIS — Z811 Family history of alcohol abuse and dependence: Secondary | ICD-10-CM | POA: Insufficient documentation

## 2019-07-01 DIAGNOSIS — Z7982 Long term (current) use of aspirin: Secondary | ICD-10-CM | POA: Diagnosis not present

## 2019-07-01 DIAGNOSIS — R0602 Shortness of breath: Secondary | ICD-10-CM | POA: Insufficient documentation

## 2019-07-01 DIAGNOSIS — E039 Hypothyroidism, unspecified: Secondary | ICD-10-CM | POA: Insufficient documentation

## 2019-07-01 DIAGNOSIS — I341 Nonrheumatic mitral (valve) prolapse: Secondary | ICD-10-CM | POA: Insufficient documentation

## 2019-07-01 DIAGNOSIS — Z8249 Family history of ischemic heart disease and other diseases of the circulatory system: Secondary | ICD-10-CM | POA: Insufficient documentation

## 2019-07-01 DIAGNOSIS — M5116 Intervertebral disc disorders with radiculopathy, lumbar region: Secondary | ICD-10-CM | POA: Diagnosis not present

## 2019-07-01 DIAGNOSIS — Z91048 Other nonmedicinal substance allergy status: Secondary | ICD-10-CM | POA: Diagnosis not present

## 2019-07-01 DIAGNOSIS — Z87442 Personal history of urinary calculi: Secondary | ICD-10-CM | POA: Insufficient documentation

## 2019-07-01 DIAGNOSIS — Z791 Long term (current) use of non-steroidal anti-inflammatories (NSAID): Secondary | ICD-10-CM | POA: Diagnosis not present

## 2019-07-01 DIAGNOSIS — Z806 Family history of leukemia: Secondary | ICD-10-CM | POA: Diagnosis not present

## 2019-07-01 DIAGNOSIS — Z87891 Personal history of nicotine dependence: Secondary | ICD-10-CM | POA: Diagnosis not present

## 2019-07-01 DIAGNOSIS — Z888 Allergy status to other drugs, medicaments and biological substances status: Secondary | ICD-10-CM | POA: Insufficient documentation

## 2019-07-01 DIAGNOSIS — M48061 Spinal stenosis, lumbar region without neurogenic claudication: Secondary | ICD-10-CM | POA: Insufficient documentation

## 2019-07-01 DIAGNOSIS — Z79899 Other long term (current) drug therapy: Secondary | ICD-10-CM | POA: Insufficient documentation

## 2019-07-01 DIAGNOSIS — I480 Paroxysmal atrial fibrillation: Secondary | ICD-10-CM | POA: Insufficient documentation

## 2019-07-01 HISTORY — PX: LUMBAR LAMINECTOMY/DECOMPRESSION MICRODISCECTOMY: SHX5026

## 2019-07-01 SURGERY — LUMBAR LAMINECTOMY/DECOMPRESSION MICRODISCECTOMY
Anesthesia: General | Laterality: Right

## 2019-07-01 MED ORDER — LACTATED RINGERS IV SOLN: INTRAVENOUS | Status: DC

## 2019-07-01 MED ORDER — FENTANYL CITRATE (PF) 100 MCG/2ML IJ SOLN
INTRAMUSCULAR | Status: AC
  Filled 2019-07-01: qty 2

## 2019-07-01 MED ORDER — REMIFENTANIL HCL 1 MG IV SOLR
INTRAVENOUS | Status: AC
  Filled 2019-07-01: qty 1000

## 2019-07-01 MED ORDER — MIDAZOLAM HCL 2 MG/2ML IJ SOLN
INTRAMUSCULAR | Status: DC | PRN
  Administered 2019-07-01: 2 mg via INTRAVENOUS

## 2019-07-01 MED ORDER — OXYCODONE HCL 5 MG PO TABS: 5.0000 mg | ORAL_TABLET | Freq: Once | ORAL | Status: DC | PRN

## 2019-07-01 MED ORDER — REMIFENTANIL HCL 1 MG IV SOLR
INTRAVENOUS | Status: DC | PRN
  Administered 2019-07-01 (×2): .1 ug/kg/min via INTRAVENOUS

## 2019-07-01 MED ORDER — SUCCINYLCHOLINE CHLORIDE 20 MG/ML IJ SOLN
INTRAMUSCULAR | Status: DC | PRN
  Administered 2019-07-01: 100 mg via INTRAVENOUS

## 2019-07-01 MED ORDER — EPINEPHRINE PF 1 MG/ML IJ SOLN
INTRAMUSCULAR | Status: AC
  Filled 2019-07-01: qty 1

## 2019-07-01 MED ORDER — FENTANYL CITRATE (PF) 100 MCG/2ML IJ SOLN
INTRAMUSCULAR | Status: DC | PRN
  Administered 2019-07-01 (×2): 50 ug via INTRAVENOUS

## 2019-07-01 MED ORDER — THROMBIN 5000 UNITS EX SOLR
CUTANEOUS | Status: DC | PRN
  Administered 2019-07-01: 5000 [IU] via TOPICAL

## 2019-07-01 MED ORDER — LIDOCAINE HCL (CARDIAC) PF 100 MG/5ML IV SOSY
PREFILLED_SYRINGE | INTRAVENOUS | Status: DC | PRN
  Administered 2019-07-01: 100 mg via INTRAVENOUS

## 2019-07-01 MED ORDER — VASOPRESSIN 20 UNIT/ML IV SOLN
INTRAVENOUS | Status: DC | PRN
Start: 2019-07-01 — End: 2019-07-01
  Administered 2019-07-01 (×3): 2 [IU] via INTRAVENOUS

## 2019-07-01 MED ORDER — BUPIVACAINE HCL (PF) 0.5 % IJ SOLN
INTRAMUSCULAR | Status: AC
  Filled 2019-07-01: qty 30

## 2019-07-01 MED ORDER — DEXAMETHASONE SODIUM PHOSPHATE 10 MG/ML IJ SOLN
INTRAMUSCULAR | Status: DC | PRN
  Administered 2019-07-01: 10 mg via INTRAVENOUS

## 2019-07-01 MED ORDER — GELATIN ABSORBABLE 12-7 MM EX MISC
CUTANEOUS | Status: AC
  Filled 2019-07-01: qty 1

## 2019-07-01 MED ORDER — FAMOTIDINE 20 MG PO TABS
20.0000 mg | ORAL_TABLET | Freq: Once | ORAL | Status: AC
  Administered 2019-07-01: 20 mg via ORAL

## 2019-07-01 MED ORDER — EPHEDRINE SULFATE 50 MG/ML IJ SOLN
INTRAMUSCULAR | Status: DC | PRN
  Administered 2019-07-01 (×3): 10 mg via INTRAVENOUS

## 2019-07-01 MED ORDER — FENTANYL CITRATE (PF) 100 MCG/2ML IJ SOLN: 25.0000 ug | INTRAMUSCULAR | Status: DC | PRN

## 2019-07-01 MED ORDER — OXYCODONE HCL 5 MG/5ML PO SOLN: 5.0000 mg | Freq: Once | ORAL | Status: DC | PRN

## 2019-07-01 MED ORDER — ACETAMINOPHEN 10 MG/ML IV SOLN
INTRAVENOUS | Status: AC
  Filled 2019-07-01: qty 100

## 2019-07-01 MED ORDER — CEFAZOLIN SODIUM-DEXTROSE 2-4 GM/100ML-% IV SOLN
2.0000 g | Freq: Once | INTRAVENOUS | Status: AC
  Administered 2019-07-01: 2 g via INTRAVENOUS

## 2019-07-01 MED ORDER — ACETAMINOPHEN 10 MG/ML IV SOLN
INTRAVENOUS | Status: DC | PRN
  Administered 2019-07-01: 1000 mg via INTRAVENOUS

## 2019-07-01 MED ORDER — CEFAZOLIN SODIUM-DEXTROSE 2-4 GM/100ML-% IV SOLN
INTRAVENOUS | Status: AC
  Filled 2019-07-01: qty 100

## 2019-07-01 MED ORDER — OXYCODONE HCL 5 MG PO TABS: 5.0000 mg | ORAL_TABLET | ORAL | 0 refills | Status: DC | PRN

## 2019-07-01 MED ORDER — MIDAZOLAM HCL 2 MG/2ML IJ SOLN
INTRAMUSCULAR | Status: AC
  Filled 2019-07-01: qty 2

## 2019-07-01 MED ORDER — METHYLPREDNISOLONE ACETATE 40 MG/ML IJ SUSP
INTRAMUSCULAR | Status: AC
  Filled 2019-07-01: qty 1

## 2019-07-01 MED ORDER — ONDANSETRON HCL 4 MG/2ML IJ SOLN
INTRAMUSCULAR | Status: DC | PRN
  Administered 2019-07-01: 4 mg via INTRAVENOUS

## 2019-07-01 MED ORDER — GLYCOPYRROLATE 0.2 MG/ML IJ SOLN
INTRAMUSCULAR | Status: DC | PRN
  Administered 2019-07-01 (×2): .2 mg via INTRAVENOUS

## 2019-07-01 MED ORDER — PHENYLEPHRINE HCL (PRESSORS) 10 MG/ML IV SOLN
INTRAVENOUS | Status: DC | PRN
  Administered 2019-07-01: 100 ug via INTRAVENOUS
  Administered 2019-07-01: 200 ug via INTRAVENOUS

## 2019-07-01 MED ORDER — THROMBIN 5000 UNITS EX SOLR
CUTANEOUS | Status: AC
  Filled 2019-07-01: qty 5000

## 2019-07-01 MED ORDER — FAMOTIDINE 20 MG PO TABS
ORAL_TABLET | ORAL | Status: AC
  Filled 2019-07-01: qty 1

## 2019-07-01 MED ORDER — METHOCARBAMOL 500 MG PO TABS: 500.0000 mg | ORAL_TABLET | Freq: Three times a day (TID) | ORAL | 0 refills | Status: AC | PRN

## 2019-07-01 MED ORDER — SODIUM CHLORIDE 0.9 % IV SOLN
INTRAVENOUS | Status: DC | PRN
  Administered 2019-07-01: 15 ug/min via INTRAVENOUS

## 2019-07-01 MED ORDER — PROPOFOL 10 MG/ML IV BOLUS
INTRAVENOUS | Status: DC | PRN
  Administered 2019-07-01: 150 mg via INTRAVENOUS

## 2019-07-01 MED ORDER — LACTATED RINGERS IV SOLN: INTRAVENOUS | Status: DC | PRN

## 2019-07-01 SURGICAL SUPPLY — 67 items
BLADE BOVIE TIP EXT 4 (BLADE) ×3 IMPLANT
BUR NEURO DRILL SOFT 3.0X3.8M (BURR) ×3 IMPLANT
CANISTER SUCT 1200ML W/VALVE (MISCELLANEOUS) ×3 IMPLANT
CHLORAPREP W/TINT 26 (MISCELLANEOUS) ×5 IMPLANT
CNTNR SPEC 2.5X3XGRAD LEK (MISCELLANEOUS) ×1
CONT SPEC 4OZ STER OR WHT (MISCELLANEOUS) ×2
CONTAINER SPEC 2.5X3XGRAD LEK (MISCELLANEOUS) ×1 IMPLANT
COUNTER NEEDLE 20/40 LG (NEEDLE) ×3 IMPLANT
COVER LIGHT HANDLE STERIS (MISCELLANEOUS) ×6 IMPLANT
COVER WAND RF STERILE (DRAPES) ×3 IMPLANT
DERMABOND ADVANCED (GAUZE/BANDAGES/DRESSINGS) ×2
DERMABOND ADVANCED .7 DNX12 (GAUZE/BANDAGES/DRESSINGS) ×1 IMPLANT
DRAPE C-ARM 42X70 (DRAPES) ×6 IMPLANT
DRAPE C-ARM 42X72 X-RAY (DRAPES) ×6 IMPLANT
DRAPE LAPAROTOMY 100X77 ABD (DRAPES) ×3 IMPLANT
DRAPE MICROSCOPE SPINE 48X150 (DRAPES) IMPLANT
DRAPE POUCH INSTRU U-SHP 10X18 (DRAPES) ×3 IMPLANT
DRAPE SURG 17X11 SM STRL (DRAPES) ×6 IMPLANT
DURASEAL APPLICATOR TIP (TIP) IMPLANT
DURASEAL SPINE SEALANT 3ML (MISCELLANEOUS) IMPLANT
ELECT CAUTERY BLADE TIP 2.5 (TIP) ×3
ELECT EZSTD 165MM 6.5IN (MISCELLANEOUS) ×3
ELECT REM PT RETURN 9FT ADLT (ELECTROSURGICAL) ×3
ELECTRODE CAUTERY BLDE TIP 2.5 (TIP) ×1 IMPLANT
ELECTRODE EZSTD 165MM 6.5IN (MISCELLANEOUS) ×1 IMPLANT
ELECTRODE REM PT RTRN 9FT ADLT (ELECTROSURGICAL) ×1 IMPLANT
GAUZE SPONGE 4X4 12PLY STRL (GAUZE/BANDAGES/DRESSINGS) ×3 IMPLANT
GLOVE BIO SURGEON STRL SZ 6.5 (GLOVE) ×2 IMPLANT
GLOVE BIO SURGEONS STRL SZ 6.5 (GLOVE) ×1
GLOVE BIOGEL PI IND STRL 7.0 (GLOVE) ×2 IMPLANT
GLOVE BIOGEL PI IND STRL 8 (GLOVE) ×1 IMPLANT
GLOVE BIOGEL PI INDICATOR 7.0 (GLOVE) ×4
GLOVE BIOGEL PI INDICATOR 8 (GLOVE) ×2
GLOVE INDICATOR 7.0 STRL GRN (GLOVE) ×3 IMPLANT
GLOVE INDICATOR 8.0 STRL GRN (GLOVE) ×3 IMPLANT
GLOVE SURG SYN 7.0 (GLOVE) ×12 IMPLANT
GLOVE SURG SYN 7.0 PF PI (GLOVE) ×4 IMPLANT
GLOVE SURG SYN 8.0 (GLOVE) ×6 IMPLANT
GLOVE SURG SYN 8.0 PF PI (GLOVE) ×2 IMPLANT
GOWN STRL REUS W/ TWL LRG LVL3 (GOWN DISPOSABLE) ×1 IMPLANT
GOWN STRL REUS W/ TWL XL LVL3 (GOWN DISPOSABLE) ×2 IMPLANT
GOWN STRL REUS W/TWL LRG LVL3 (GOWN DISPOSABLE) ×2
GOWN STRL REUS W/TWL MED LVL3 (GOWN DISPOSABLE) ×3 IMPLANT
GOWN STRL REUS W/TWL XL LVL3 (GOWN DISPOSABLE) ×4
GRADUATE 1200CC STRL 31836 (MISCELLANEOUS) ×3 IMPLANT
KIT TURNOVER KIT A (KITS) ×3 IMPLANT
KIT WILSON FRAME (KITS) ×3 IMPLANT
KNIFE BAYONET SHORT DISCETOMY (MISCELLANEOUS) ×2 IMPLANT
MARKER SKIN DUAL TIP RULER LAB (MISCELLANEOUS) ×6 IMPLANT
NDL SAFETY ECLIPSE 18X1.5 (NEEDLE) ×1 IMPLANT
NEEDLE HYPO 18GX1.5 SHARP (NEEDLE) ×2
NEEDLE HYPO 22GX1.5 SAFETY (NEEDLE) ×3 IMPLANT
NS IRRIG 1000ML POUR BTL (IV SOLUTION) ×3 IMPLANT
PACK LAMINECTOMY NEURO (CUSTOM PROCEDURE TRAY) ×3 IMPLANT
PAD ARMBOARD 7.5X6 YLW CONV (MISCELLANEOUS) ×3 IMPLANT
SPOGE SURGIFLO 8M (HEMOSTASIS) ×2
SPONGE SURGIFLO 8M (HEMOSTASIS) IMPLANT
SUT NURALON 4 0 TR CR/8 (SUTURE) IMPLANT
SUT POLYSORB 2-0 5X18 GS-10 (SUTURE) ×8 IMPLANT
SUT VIC AB 0 CT1 18XCR BRD 8 (SUTURE) ×1 IMPLANT
SUT VIC AB 0 CT1 8-18 (SUTURE) ×4
SYR 20ML LL LF (SYRINGE) ×3 IMPLANT
SYR 3ML 18GX1 1/2 (SYRINGE) ×4 IMPLANT
TOWEL OR 17X26 4PK STRL BLUE (TOWEL DISPOSABLE) ×9 IMPLANT
TRAY FOLEY MTR SLVR 16FR STAT (SET/KITS/TRAYS/PACK) IMPLANT
TUBING CONNECTING 10 (TUBING) ×2 IMPLANT
TUBING CONNECTING 10' (TUBING) ×1

## 2019-07-01 NOTE — Op Note (Signed)
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 Gazda to the clinic on4/13with ongoingright leg pain.MRIrevealed right side stenosis at L3/4 and L4/5with a large disc herniation at L4/5.He had been through prescription pain medication, ESI, PT, and steroidswithout improvement.Lumbardecompressionwith discectomy and decompressionwas discussed to relieve symptoms.Therisks of surgery were explained to include hematoma, infection, damage to nerve roots, CSF leak, weakness, numbness, pain, need for future surgery including fusion, heart attack, and stroke. He elected to proceed with surgery for symptom relief.   Procedure The patient was brought to the OR after informed consent was obtained.He was given general anesthesia and intubated by the anesthesia service. Vascular access lines were placed.The patient was then placed prone on a Wilson frameensuring all pressure points were padded. A time-out was performed per protocol.   The patient was sterilely prepped and draped.Amidlineincision wasfound using fluoroscopy andmarked andinstilled withlocal anesthetic with epinephrine. The skin was opened sharply and the dissection taken to the fascia. This was incised and cautery was used to dissect the subperiosteal plane to expose the spinous processes and lamina of L3-L5. Retractors were inserted and hemostasis was achieved. X-ray was used to confirm location.  Next, a  matchstickdrill bit was used to remove theentire caudal L4laminaand superiorL5lamina to expose the ligament. We removed the ligament until normal dura seen. The dura was seen to be expanding. We retracted the dura medially and there was a firm disc protrusion. This was entered sharply and small fragments of disc removed. There was no soft disc protrusion. The epidural space was inspected and no obvious compression remained. We irrigated the space and removed any free tissue.   Next, we moved the retractors to L3/4 and remove the caudal portion of L3 lamina to expose ligament. This ligament was removed and dura seen. We then removed the medial facet overgrowth and used a curette to remove the ligament. Once the dura was free and no stenosis seen laterally, we irrigated the space and gained hemostasis.  Once the dura appeared free from all the bone edges, hemostasis was obtained with Floseal and cautery.The wound was irrigated profusely. Next, Depomedrol was placed at the exposed dura sites.Thefascia and musclewas then closed using 0 vicryl.Next, multiple subcutaneous and dermal layers were closed with 2-0 vicryl until the epidermis was well approximated. The skin was closed withDermabond  The patient was returned to supine position and extubated by the anesthesia service. The patient was then taken to the PACU for post-operative care wherehe was moving extremities symmetrically.   ESTIMATED BLOOD LOSS: 50cc  SPECIMENS None  IMPLANT None   I performed the case in its entiretywith the assistance of [place], [place],  Deetta Perla, [place]

## 2019-07-01 NOTE — Anesthesia Preprocedure Evaluation (Signed)
Anesthesia Evaluation  Patient identified by MRN, date of birth, ID band Patient awake    Reviewed: Allergy & Precautions, H&P , NPO status , Patient's Chart, lab work & pertinent test results  History of Anesthesia Complications Negative for: history of anesthetic complications  Airway Mallampati: III  TM Distance: <3 FB Neck ROM: limited    Dental  (+) Chipped, Poor Dentition   Pulmonary neg pulmonary ROS, neg shortness of breath, former smoker,    Pulmonary exam normal        Cardiovascular Exercise Tolerance: Good (-) angina(-) Past MI Normal cardiovascular exam+ dysrhythmias Atrial Fibrillation      Neuro/Psych  Headaches, negative psych ROS   GI/Hepatic negative GI ROS, Neg liver ROS,   Endo/Other  Hypothyroidism   Renal/GU Renal disease     Musculoskeletal   Abdominal   Peds  Hematology negative hematology ROS (+)   Anesthesia Other Findings Past Medical History: No date: Chest tightness     Comment:  a. 05/2017 Cath: nl cors. No date: Dilated aortic root ([place])     Comment:  a. 06/2017 Echo: Ao root 4.4cm. No date: Dysrhythmia     Comment:  afib No date: Headache     Comment:  migraines in past. none for 6-8 yrs. No date: Hypothyroidism No date: Mitral valve prolapse     Comment:  a. 09/2015 Echo: EF 55-60%, no rwma, nl LA size, nl RV               fxn, nl PASP; b. 06/2017 Echo: EF 55-60%, no rwma, Gr2 DD.              Asc Ao 4.4cm. Mild MR (? mild prolapse). Mildly dil LA. No date: Nephrolithiasis     Comment:  a. 07/2017 CT abd: 75mm obstructing distal R ureteral               calculus s/p lithotripsy and stenting. No date: PAF (paroxysmal atrial fibrillation) (HCC)     Comment:  a. Dx in 1980's; b. CHA2DS2VASc = 1 - briefly on               coumadin many years ago-->caused rash; c. Chronic Amio               200mg  QD. No date: Wears contact lenses  Past Surgical History: No date: CARDIAC  CATHETERIZATION 04/25/05: COLONOSCOPY 07/22/2015: COLONOSCOPY WITH PROPOFOL; N/A     Comment:  Procedure: COLONOSCOPY WITH PROPOFOL;  Surgeon: Robert Bellow, MD;  Location: [facility];  Service:               Endoscopy;  Laterality: N/A; 07/28/2017: CYSTOSCOPY WITH HOLMIUM LASER LITHOTRIPSY; Bilateral     Comment:  Procedure: CYSTOSCOPY WITH HOLMIUM LASER LITHOTRIPSY;                Surgeon: Lucas Mallow, MD;  Location: [facility];                Service: Urology;  Laterality: Bilateral; 07/28/2017: CYSTOSCOPY WITH STENT PLACEMENT; Bilateral     Comment:  Procedure: [place];  Surgeon:               Lucas Mallow, MD;  Location: [facility] OR[facility];  Service:               Urology;  Laterality: Bilateral; 07/28/2017: CYSTOSCOPY WITH URETEROSCOPY; Bilateral  Comment:  Procedure: CYSTOSCOPY WITH URETEROSCOPY;  Surgeon: Lucas Mallow, MD;  Location: [facility] ORS;  Service: Urology;              Laterality: Bilateral; No date: EYE SURGERY; Left     Comment:  cataract surgery No date: HEMORROIDECTOMY May 22, 2015: HERNIA REPAIR; Left     Comment:  Left inguinal hernia repair with medium Ultra Pro mesh 07/22/2015: INGUINAL HERNIA REPAIR; Left     Comment:  Procedure: HERNIA REPAIR INGUINAL ADULT;  Surgeon:               Robert Bellow, MD;  Location: [facility] OR[facility];  Service:               General;  Laterality: Left; 05/29/2017: LEFT HEART CATH AND CORONARY ANGIOGRAPHY; Left     Comment:  Procedure: LEFT HEART CATH AND CORONARY ANGIOGRAPHY;                Surgeon: [name], MD;  Location: [facility]               [facility];  Service: Cardiovascular;  Laterality: Left; No date: myringotomy     Comment:  with tubes x 5.  tubes fall out frequently 12/13/2018: MYRINGOTOMY WITH TUBE PLACEMENT; Left     Comment:  Procedure: MYRINGOTOMY WITH T-TUBE PLACEMENT;  Surgeon:               [name], MD;  Location: [facility];                 Service: ENT;  Laterality: Left;  needs to stay first               case vaught / juengel doing case together No date: NASAL SINUS SURGERY 12/13/2018: NASOPHARYNGOSCOPY EUSTATION TUBE BALLOON DILATION; Left     Comment:  Procedure: NASOPHARYNGOSCOPY EUSTATION TUBE BALLOON               DILATION WITH OUTFRACTURE OF TURBINATES;  Surgeon:               [name], MD;  Location: [facility];                Service: ENT;  Laterality: Left; No date: VASECTOMY  BMI    Body Mass Index: 25.99 kg/m      Reproductive/Obstetrics negative OB ROS                             Anesthesia Physical Anesthesia Plan  ASA: III  Anesthesia Plan: General ETT   Post-op Pain Management:    Induction: Intravenous  PONV Risk Score and Plan: Ondansetron, Dexamethasone, Midazolam and Treatment may vary due to age or medical condition  Airway Management Planned: Oral ETT  Additional Equipment:   Intra-op Plan:   Post-operative Plan: Extubation in OR  Informed Consent: I have reviewed the patients History and Physical, chart, labs and discussed the procedure including the risks, benefits and alternatives for the proposed anesthesia with the patient or authorized representative who has indicated his/her understanding and acceptance.     Dental Advisory Given  Plan Discussed with: Anesthesiologist, CRNA and Surgeon  Anesthesia Plan Comments: (Patient consented for risks of anesthesia including but not limited to:  - adverse reactions to medications - damage to eyes, teeth, lips or other oral  mucosa - nerve damage due to positioning  - sore throat or hoarseness - Damage to heart, brain, nerves, lungs or loss of life  Patient voiced understanding.)        Anesthesia Quick Evaluation

## 2019-07-01 NOTE — Anesthesia Procedure Notes (Signed)
Procedure Name: Intubation Performed by: Fletcher-Harrison, Jahzaria Vary, CRNA Pre-anesthesia Checklist: Patient identified, Emergency Drugs available, Suction available and Patient being monitored Patient Re-evaluated:Patient Re-evaluated prior to induction Oxygen Delivery Method: Circle system utilized Preoxygenation: Pre-oxygenation with 100% oxygen Induction Type: IV induction Ventilation: Mask ventilation without difficulty Laryngoscope Size: McGraph and 3 Grade View: Grade I Tube type: Oral Tube size: 7.0 mm Number of attempts: 1 Airway Equipment and Method: Stylet Placement Confirmation: ETT inserted through vocal cords under direct vision,  positive ETCO2,  CO2 detector and breath sounds checked- equal and bilateral Secured at: 21 cm Tube secured with: Tape Dental Injury: Teeth and Oropharynx as per pre-operative assessment        

## 2019-07-01 NOTE — Transfer of Care (Signed)
Immediate Anesthesia Transfer of Care Note  Patient: Bobby Flowers  Procedure(s) Performed: OPEN L4 LAMINECTOMY, L3/4 DISCECTOMY ON RIGHT, L4/5 DISCECTOMY (Right )  Patient Location: PACU  Anesthesia Type:General  Level of Consciousness: awake, drowsy and patient cooperative  Airway & Oxygen Therapy: Patient Spontanous Breathing and Patient connected to face mask oxygen  Post-op Assessment: Report given to RN and Post -op Vital signs reviewed and stable  Post vital signs: Reviewed and stable  Last Vitals:  Vitals Value Taken Time  BP 138/70 07/01/19 1201  Temp 36.1 C 07/01/19 1201  Pulse 74 07/01/19 1211  Resp 20 07/01/19 1211  SpO2 97 % 07/01/19 1211  Vitals shown include unvalidated device data.  Last Pain:  Vitals:   07/01/19 1201  TempSrc:   PainSc: Asleep         Complications: No apparent anesthesia complications

## 2019-07-01 NOTE — Discharge Instructions (Signed)
AMBULATORY SURGERY  DISCHARGE INSTRUCTIONS   1) The drugs that you were given will stay in your system until tomorrow so for the next 24 hours you should not:  A) Drive an automobile B) Make any legal decisions C) Drink any alcoholic beverage   2) You may resume regular meals tomorrow.  Today it is better to start with liquids and gradually work up to solid foods.  You may eat anything you prefer, but it is better to start with liquids, then soup and crackers, and gradually work up to solid foods.   3) Please notify your doctor immediately if you have any unusual bleeding, trouble breathing, redness and pain at the surgery site, drainage, fever, or pain not relieved by medication.    4) Additional Instructions:        Please contact your physician with any problems or Same Day Surgery at [phone], Monday through Friday 6 am to 4 pm, or  at [facility] number at [phone].   Your surgeon has performed an operation on your lumbar spine (low back) to relieve pressure on one or more nerves. Many times, patients feel better immediately after surgery and can "overdo it." Even if you feel well, it is important that you follow these activity guidelines. If you do not let your back heal properly from the surgery, you can increase the chance of a disc herniation and/or return of your symptoms. The following are instructions to help in your recovery once you have been discharged from the hospital.  * Do not take anti-inflammatory medications for 3 days after surgery (naproxen [Aleve], ibuprofen [Advil, Motrin])  PLEASE DO NOT TAKE YOUR ASPIRIN UNTIL POD 7 (07/08/2019)   Activity    No bending, lifting, or twisting ("BLT"). Avoid lifting objects heavier than 10 pounds (gallon milk jug).  Where possible, avoid household activities that involve lifting, bending, pushing, or pulling such as laundry, vacuuming, grocery shopping, and childcare. Try to arrange for help from  friends and family for these activities while your back heals.  PLEASE WEAR YOUR BACK BRACE FOR SUPPORT UNTIL YOUR POST OP VISIT IN 2 WEEKS  Increase physical activity slowly as tolerated.  Taking short walks is encouraged, but avoid strenuous exercise. Do not jog, run, bicycle, lift weights, or participate in any other exercises unless specifically allowed by your doctor. Avoid prolonged sitting, including car rides.  Talk to your doctor before resuming sexual activity.  You should not drive until cleared by your doctor.  Until released by your doctor, you should not return to work or school.  You should rest at home and let your body heal.   You may shower two days after your surgery.  After showering, lightly dab your incision dry. Do not take a tub bath or go swimming for 3 weeks, or until approved by your doctor at your follow-up appointment.  If you smoke, we strongly recommend that you quit.  Smoking has been proven to interfere with normal healing in your back and will dramatically reduce the success rate of your surgery. Please contact [facility] (800-QUIT-NOW) and use the resources at www.[facility].com for assistance in stopping smoking.  Surgical Incision   If you have a dressing on your incision, you may remove it three days after your surgery. Keep your incision area clean and dry.  If you have staples or stitches on your incision, you should have a follow up scheduled for removal. If you do not have staples or stitches, you will have steri-strips (small  pieces of surgical tape) or Dermabond glue. The steri-strips/glue should begin to peel away within about a week (it is fine if the steri-strips fall off before then). If the strips are still in place one week after your surgery, you may gently remove them.  Diet            You may return to your usual diet. Be sure to stay hydrated.  When to Contact us  Although your surgery and recovery will likely be uneventful, you may  have some residual numbness, aches, and pains in your back and/or legs. This is normal and should improve in the next few weeks.  However, should you experience any of the following, contact us immediately: . New numbness or weakness . Pain that is progressively getting worse, and is not relieved by your pain medications or rest . Bleeding, redness, swelling, pain, or drainage from surgical incision . Chills or flu-like symptoms . Fever greater than 101.0 F (38.3 C) . Problems with bowel or bladder functions . Difficulty breathing or shortness of breath . Warmth, tenderness, or swelling in your calf  Contact Information . During office hours (Monday-Friday 9 am to 5 pm), please call your physician at [phone] . After hours and weekends, please call [phone] and an answering service will put you in touch with either Dr. Lacinda Axon or Dr. Izora Ribas.  . For a life-threatening emergency, call 911

## 2019-07-01 NOTE — Discharge Summary (Signed)
  Procedure: OPEN L4 LAMINECTOMY, L3/4 DISCECTOMY ON RIGHT, L4/5 DISCECTOMY Procedure date: 07/01/2019 Diagnosis: Lumbar radiculopathy   History: Bobby Flowers is s/p OPEN L4 LAMINECTOMY, L3/4 DISCECTOMY ON RIGHT, L4/5 DISCECTOMY POD: Tolerated procedure well. Evaluated in post op recovery still disoriented from anesthesia but able to answer questions and obey commands.   Physical Exam: Vitals:   07/01/19 1201 07/01/19 1216  BP: 138/70 135/78  Pulse: 80   Resp: 15   Temp: (!) 97 F (36.1 C)   SpO2: 98% 93%    General: Alert and oriented, sitting in bed Strength:5/5 throughout  Sensation: intact and symmetric throughout  Skin: Dermabond to incision, clean, dry, intact  Data:  Recent Labs  Lab 06/27/19 1104  NA 141  K 4.3  CL 103  CO2 31  BUN 17  CREATININE 0.97  GLUCOSE 80  CALCIUM 9.1   No results for input(s): AST, ALT, ALKPHOS in the last 168 hours.  Invalid input(s): TBILI   Recent Labs  Lab 06/27/19 1104  WBC 8.8  HGB 16.0  HCT 47.9  PLT 195   Recent Labs  Lab 06/27/19 1104  APTT 33  INR 1.0           Assessment/Plan:  Bobby Flowers is POD0 s/p OPEN L4 LAMINECTOMY, L3/4 DISCECTOMY ON RIGHT, L4/5 DISCECTOMY. Moving all extremities.  Denies numbness or tingling.  Pain well controlled.  Once able to urinate and ambulate, patient acceptable for discharge home.  Must hold baby aspirin for 7 days postoperatively, and wear back brace which he has at bedside.  We will see him in clinic in 2 weeks.     Lonell Face, NP  Department of Neurosurgery

## 2019-07-01 NOTE — H&P (Signed)
Bobby Flowers is an 69 y.o. male.   Chief Complaint: Right leg pain HPI: Bobby Flowers is here for evaluation of ongoing right leg pain. He states he was previously having some left-sided leg pain but this seemed to improved with injections. The right leg pain is not improved overall. This pain will primarily go from the buttocks area down the posterior leg. He states it can be worsened with movement and walking but is very persistent otherwise. He does have some chronic numbness in the anterior side of his right leg which is been there for years. He also endorses a multiyear history of low back pain. Currently, the leg pain far outweighs the back pain. When he was having left leg pain, it was a not a similar distribution as the posterior right leg pain. He has started physical therapy but has not noticed any improvement with this. He does take gabapentin and Celebrex which he says does overall help him tolerate the pain. 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 He is here after having a MRI and we discussed a right L3/4 and L4/5 decompression with possible L4 laminectomy   Past Medical History:  Diagnosis Date  . Chest tightness    a. 05/2017 Cath: nl cors.  . Dilated aortic root ([place])    a. 06/2017 Echo: Ao root 4.4cm.  Marland Kitchen Dysrhythmia    afib  . Headache    migraines in past. none for 6-8 yrs.  . Hypothyroidism   . Mitral valve prolapse    a. 09/2015 Echo: EF 55-60%, no rwma, nl LA size, nl RV fxn, nl PASP; b. 06/2017 Echo: EF 55-60%, no rwma, Gr2 DD. Asc Ao 4.4cm. Mild MR (? mild prolapse). Mildly dil LA.  . Nephrolithiasis    a. 07/2017 CT abd: 35mm obstructing distal R ureteral calculus s/p lithotripsy and stenting.  Marland Kitchen PAF (paroxysmal atrial fibrillation) ([place])    a. Dx in 1980's; b. CHA2DS2VASc = 1 - briefly on coumadin many years ago-->caused rash; c. Chronic Amio 200mg  QD.  Marland Kitchen Wears contact lenses     Past Surgical History:   Procedure Laterality Date  . CARDIAC CATHETERIZATION    . COLONOSCOPY  04/25/05  . COLONOSCOPY WITH PROPOFOL N/A 07/22/2015   Procedure: COLONOSCOPY WITH PROPOFOL;  Surgeon: Robert Bellow, MD;  Location: [facility] ENDOSCOPY;  Service: Endoscopy;  Laterality: N/A;  . CYSTOSCOPY WITH HOLMIUM LASER LITHOTRIPSY Bilateral 07/28/2017   Procedure: CYSTOSCOPY WITH HOLMIUM LASER LITHOTRIPSY;  Surgeon: Lucas Mallow, MD;  Location: [facility] ORS;  Service: Urology;  Laterality: Bilateral;  . CYSTOSCOPY WITH STENT PLACEMENT Bilateral 07/28/2017   Procedure: CYSTOSCOPY WITH STENT PLACEMENT;  Surgeon: Lucas Mallow, MD;  Location: [facility] ORS;  Service: Urology;  Laterality: Bilateral;  . CYSTOSCOPY WITH URETEROSCOPY Bilateral 07/28/2017   Procedure: CYSTOSCOPY WITH URETEROSCOPY;  Surgeon: Lucas Mallow, MD;  Location: [facility] ORS;  Service: Urology;  Laterality: Bilateral;  . EYE SURGERY Left    cataract surgery  . HEMORROIDECTOMY    . HERNIA REPAIR Left May 22, 2015   Left inguinal hernia repair with medium Ultra Pro mesh  . INGUINAL HERNIA REPAIR Left 07/22/2015   Procedure: HERNIA REPAIR INGUINAL ADULT;  Surgeon: Robert Bellow, MD;  Location: [facility] ORS;  Service: General;  Laterality: Left;  . LEFT HEART CATH AND CORONARY ANGIOGRAPHY Left 05/29/2017   Procedure: LEFT HEART CATH AND CORONARY ANGIOGRAPHY;  Surgeon: [name]  A, MD;  Location: [place] CV LAB;  Service: Cardiovascular;  Laterality: Left;  . myringotomy     with tubes x 5.  tubes fall out frequently  . MYRINGOTOMY WITH TUBE PLACEMENT Left 12/13/2018   Procedure: MYRINGOTOMY WITH T-TUBE PLACEMENT;  Surgeon: [name], MD;  Location: [place];  Service: ENT;  Laterality: Left;  needs to stay first case vaught / juengel doing case together  . NASAL SINUS SURGERY    . NASOPHARYNGOSCOPY EUSTATION TUBE BALLOON DILATION Left 12/13/2018   Procedure: NASOPHARYNGOSCOPY EUSTATION TUBE BALLOON DILATION WITH OUTFRACTURE  OF TURBINATES;  Surgeon: [name], MD;  Location: [place];  Service: ENT;  Laterality: Left;  Marland Kitchen VASECTOMY      Family History  Problem Relation Age of Onset  . Heart failure Mother   . Leukemia Father   . Alcoholism Brother    Social History:  reports that he quit smoking about 18 years ago. His smoking use included cigarettes. He has a 46.50 pack-year smoking history. He has never used smokeless tobacco. He reports that he does not drink alcohol or use drugs.  Allergies:  Allergies  Allergen Reactions  . Adhesive [Tape] Rash    Paper tape okay  . Coumadin [Warfarin] Rash    Medications Prior to Admission  Medication Sig Dispense Refill  . amiodarone (PACERONE) 200 MG tablet Take 1 tablet (200 mg total) by mouth daily. 90 tablet 3  . aspirin EC 81 MG tablet Take 1 tablet (81 mg total) by mouth daily. 90 tablet 3  . atenolol (TENORMIN) 100 MG tablet Take 0.5 tablets (50 mg total) by mouth daily. 45 tablet 1  . celecoxib (CELEBREX) 200 MG capsule Take 200 mg by mouth daily.    Marland Kitchen gabapentin (NEURONTIN) 300 MG capsule Take 300-600 mg by mouth See admin instructions. 300 mg in the morning, 600 mg at bedtime  0  . latanoprost (XALATAN) 0.005 % ophthalmic solution Place 1 drop into both eyes at bedtime.     Marland Kitchen levothyroxine (SYNTHROID, LEVOTHROID) 75 MCG tablet Take 75 mcg by mouth See admin instructions. Monday through Saturday    . meloxicam (MOBIC) 15 MG tablet Take 1 tablet (15 mg total) by mouth daily. 30 tablet 1  . SUMAtriptan (IMITREX) 100 MG tablet Take 100 mg by mouth every 2 (two) hours as needed for migraine. May repeat in 2 hours if headache persists or recurs.    . ondansetron (ZOFRAN) 4 MG tablet Take 1 tablet (4 mg total) by mouth every 8 (eight) hours as needed for up to 10 doses for nausea or vomiting. (Patient not taking: Reported on 06/11/2019) 10 tablet 0    Results for orders placed or performed during the hospital encounter of 07/01/19 (from the  past 48 hour(s))  ABO/Rh     Status: None   Collection Time: 07/01/19  7:50 AM  Result Value Ref Range   ABO/RH(D)      A POS Performed at [facility], [address] [place], [place] [address]    No results found.  Review of Systems General ROS: Negative Psychological ROS: Negative Ophthalmic ROS: Negative ENT ROS: Negative Hematological and Lymphatic ROS: Negative  Endocrine ROS: Negative Respiratory ROS: Negative Cardiovascular ROS: Negative Gastrointestinal ROS: Negative Genito-Urinary ROS: Negative Musculoskeletal ROS: Positive for back pain Neurological ROS: Positive for leg pain, right leg numbness Dermatological ROS: Negative  Blood pressure 126/73, pulse (!) 50, temperature 98.6 F (37 C), temperature source Temporal, resp. rate 16, height  5\' 9"  (1.753 m), weight 79.8 kg, SpO2 99 %. Physical Exam  General appearance: Alert, cooperative, in no acute distress Head: Normocephalic, atraumatic Eyes: Normal, EOM intact Oropharynx: Wearing facemask CV: Regular rate and rhythm Pulm: Clear to auscultation Back: No tenderness to palpation of the midline or paramedian regions Ext: No edema in LE bilaterally  Neurologic exam:  Mental status: alertness: alert, affect: normal Speech: fluent and clear Motor:strength symmetric 5/5 in bilateral hip flexion, knee flexion, knee sense of dorsiflexion and plantarflexion Sensory: intact to light touch in bilateral lower extremities Reflexes: 1+ and symmetric bilaterally for patella Gait: normal    Imaging: MRI lumbar spine: There is a normal daughter curvature. There is a normal alignment with the exception of a mild grade 1 listhesis of L4 anteriorly on L5. There is some moderate degenerative disease noted from L3-S1. This does result in a disc protrusion eccentric to the right at L3-4 causing lateral recess stenosis. There is a large disc protrusion at L4-5 causing moderate to severe central stenosis. There is a disc  protrusion eccentric to the left at L5-S1 causing lateral recess stenosis.   Assessment/Plan -We will start proceed with L4 laminectomy with L3-4 L4-5 decompression    Deetta Perla, MD 07/01/2019, 8:29 AM

## 2019-07-01 NOTE — Interval H&P Note (Signed)
History and Physical Interval Note:  07/01/2019 8:32 AM  Mauro Kaufmann  has presented today for surgery, with the diagnosis of lumbar radiculopathy, lumbar stenosis.  The various methods of treatment have been discussed with the patient and family. After consideration of risks, benefits and other options for treatment, the patient has consented to  Procedure(s): OPEN L4 LAMINECTOMY, L3/4 DISCECTOMY ON RIGHT, L4/5 DISCECTOMY (Bilateral) as a surgical intervention.  The patient's history has been reviewed, patient examined, no change in status, stable for surgery.  I have reviewed the patient's chart and labs.  Questions were answered to the patient's satisfaction.     Deetta Perla

## 2019-07-01 NOTE — Anesthesia Postprocedure Evaluation (Signed)
Anesthesia Post Note  Patient: Bobby Flowers  Procedure(s) Performed: OPEN L4 LAMINECTOMY, L3/4 DISCECTOMY ON RIGHT, L4/5 DISCECTOMY (Right )  Patient location during evaluation: PACU Anesthesia Type: General Level of consciousness: awake and alert Pain management: pain level controlled Vital Signs Assessment: post-procedure vital signs reviewed and stable Respiratory status: spontaneous breathing, nonlabored ventilation, respiratory function stable and patient connected to nasal cannula oxygen Cardiovascular status: blood pressure returned to baseline and stable Postop Assessment: no apparent nausea or vomiting Anesthetic complications: no     Last Vitals:  Vitals:   07/01/19 1304 07/01/19 1327  BP: 127/75 122/80  Pulse: 70 75  Resp:  18  Temp: (!) 36.4 C   SpO2: 100% 95%    Last Pain:  Vitals:   07/01/19 1304  TempSrc: Temporal  PainSc: 0-No pain                 Precious Haws Deltha Bernales

## 2019-07-02 LAB — ABO/RH: ABO/RH(D): A POS

## 2019-07-26 ENCOUNTER — Other Ambulatory Visit: Payer: Self-pay | Admitting: Podiatry

## 2019-08-21 ENCOUNTER — Other Ambulatory Visit: Payer: Self-pay | Admitting: Nurse Practitioner

## 2019-08-21 DIAGNOSIS — Z9889 Other specified postprocedural states: Secondary | ICD-10-CM

## 2019-08-23 ENCOUNTER — Other Ambulatory Visit: Payer: Self-pay

## 2019-08-23 ENCOUNTER — Encounter: Payer: Self-pay | Admitting: Family

## 2019-08-23 ENCOUNTER — Ambulatory Visit: Payer: Medicare HMO | Admitting: Family

## 2019-08-23 VITALS — BP 108/70 | HR 54 | Ht 69.0 in | Wt 178.2 lb

## 2019-08-23 DIAGNOSIS — I480 Paroxysmal atrial fibrillation: Secondary | ICD-10-CM

## 2019-08-23 DIAGNOSIS — I712 Thoracic aortic aneurysm, without rupture: Secondary | ICD-10-CM | POA: Diagnosis not present

## 2019-08-23 DIAGNOSIS — I341 Nonrheumatic mitral (valve) prolapse: Secondary | ICD-10-CM

## 2019-08-23 DIAGNOSIS — I7121 Aneurysm of the ascending aorta, without rupture: Secondary | ICD-10-CM

## 2019-08-23 MED ORDER — AMIODARONE HCL 200 MG PO TABS: 200.0000 mg | ORAL_TABLET | Freq: Every day | ORAL | 3 refills | Status: DC

## 2019-08-23 MED ORDER — ATENOLOL 25 MG PO TABS
25.0000 mg | ORAL_TABLET | Freq: Every day | ORAL | 3 refills | Status: DC
Start: 2019-08-23 — End: 2020-10-20

## 2019-08-23 NOTE — Patient Instructions (Addendum)
Medication Instructions:  Your physician has recommended you make the following change in your medication:   CHANGE Atenolol to 25mg  daily   *If you need a refill on your cardiac medications before your next appointment, please call your pharmacy*   Lab Work: Your physician recommends that you return for lab work today: CMET, TSH, Free T4, CBC  If you have labs (blood work) drawn today and your tests are completely normal, you will receive your results only by: Marland Kitchen MyChart Message (if you have MyChart) OR . A paper copy in the mail If you have any lab test that is abnormal or we need to change your treatment, we will call you to review the results.   Testing/Procedures:  Your EKG today shows sinus bradycardia which is a slow but regular heart rhythm  Follow-Up: At [facility], you and your health needs are our priority.  As part of our continuing mission to provide you with exceptional heart care, we have created designated Provider Care Teams.  These Care Teams include your primary Cardiologist (physician) and Advanced Practice Providers (APPs -  Physician Assistants and Nurse Practitioners) who all work together to provide you with the care you need, when you need it.  We recommend signing up for the patient portal called "MyChart".  Sign up information is provided on this After Visit Summary.  MyChart is used to connect with patients for Virtual Visits (Telemedicine).  Patients are able to view lab/test results, encounter notes, upcoming appointments, etc.  Non-urgent messages can be sent to your provider as well.   To learn more about what you can do with MyChart, go to [email or web address].    Your next appointment:  In 1 year with Dr. Fletcher Anon   Recommend calling Dr. Vivi Martens office to set up your CT scan and annual appointment. Their phone number is [phone]

## 2019-08-23 NOTE — Progress Notes (Signed)
Office Visit    Patient Name: Bobby Flowers Date of Encounter: 08/23/2019  Primary Care Provider:  [name], MD Primary Cardiologist:  [name], MD Electrophysiologist:  None   Chief Complaint    Bobby Flowers is a 69 y.o. male with a hx of PAF, MV prolapse, hypothyroidism, dilated aortic root, chest pain with normal coronary arteries by catheterization April 2019 presents today for follow up of PAF   Past Medical History    Past Medical History:  Diagnosis Date  . Chest tightness    a. 05/2017 Cath: nl cors.  . Dilated aortic root ([place])    a. 06/2017 Echo: Ao root 4.4cm.  Marland Kitchen Dysrhythmia    afib  . Headache    migraines in past. none for 6-8 yrs.  . Hypothyroidism   . Mitral valve prolapse    a. 09/2015 Echo: EF 55-60%, no rwma, nl LA size, nl RV fxn, nl PASP; b. 06/2017 Echo: EF 55-60%, no rwma, Gr2 DD. Asc Ao 4.4cm. Mild MR (? mild prolapse). Mildly dil LA.  . Nephrolithiasis    a. 07/2017 CT abd: 43mm obstructing distal R ureteral calculus s/p lithotripsy and stenting.  Marland Kitchen PAF (paroxysmal atrial fibrillation) ([place])    a. Dx in 1980's; b. CHA2DS2VASc = 1 - briefly on coumadin many years ago-->caused rash; c. Chronic Amio 200mg  QD.  Marland Kitchen Wears contact lenses    Past Surgical History:  Procedure Laterality Date  . CARDIAC CATHETERIZATION    . COLONOSCOPY  04/25/05  . COLONOSCOPY WITH PROPOFOL N/A 07/22/2015   Procedure: COLONOSCOPY WITH PROPOFOL;  Surgeon: Robert Bellow, MD;  [facility] ENDOSCOPY;  Service: Endoscopy;  Laterality: N/A;  . CYSTOSCOPY WITH HOLMIUM LASER LITHOTRIPSY Bilateral 07/28/2017   Procedure: CYSTOSCOPY WITH HOLMIUM LASER LITHOTRIPSY;  Surgeon: Lucas Mallow, MD;  Location: [facility] ORS;  Service: Urology;  Laterality: Bilateral;  . CYSTOSCOPY WITH STENT PLACEMENT Bilateral 07/28/2017   Procedure: CYSTOSCOPY WITH STENT PLACEMENT;  Surgeon: Lucas Mallow, MD;  Location: [facility] ORS;  Service: Urology;  Laterality: Bilateral;  . CYSTOSCOPY WITH  URETEROSCOPY Bilateral 07/28/2017   Procedure: CYSTOSCOPY WITH URETEROSCOPY;  Surgeon: Lucas Mallow, MD;  Location: [facility] ORS;  Service: Urology;  Laterality: Bilateral;  . EYE SURGERY Left    cataract surgery  . HEMORROIDECTOMY    . HERNIA REPAIR Left May 22, 2015   Left inguinal hernia repair with medium Ultra Pro mesh  . INGUINAL HERNIA REPAIR Left 07/22/2015   Procedure: HERNIA REPAIR INGUINAL ADULT;  Surgeon: Robert Bellow, MD;  Location: [facility] ORS;  Service: General;  Laterality: Left;  . LEFT HEART CATH AND CORONARY ANGIOGRAPHY Left 05/29/2017   Procedure: LEFT HEART CATH AND CORONARY ANGIOGRAPHY;  Surgeon: [name], MD;  Location: [place] CV LAB;  Service: Cardiovascular;  Laterality: Left;  . LUMBAR LAMINECTOMY/DECOMPRESSION MICRODISCECTOMY Right 07/01/2019   Procedure: OPEN L4 LAMINECTOMY, L3/4 DISCECTOMY ON RIGHT, L4/5 DISCECTOMY;  Surgeon: Deetta Perla, MD;  Location: [facility] ORS;  Service: Neurosurgery;  Laterality: Right;  . myringotomy     with tubes x 5.  tubes fall out frequently  . MYRINGOTOMY WITH TUBE PLACEMENT Left 12/13/2018   Procedure: MYRINGOTOMY WITH T-TUBE PLACEMENT;  Surgeon: [name], MD;  Location: [place];  Service: ENT;  Laterality: Left;  needs to stay first case vaught / juengel doing case together  . NASAL SINUS SURGERY    . NASOPHARYNGOSCOPY EUSTATION TUBE BALLOON DILATION Left 12/13/2018   Procedure: NASOPHARYNGOSCOPY  EUSTATION TUBE BALLOON DILATION WITH OUTFRACTURE OF TURBINATES;  Surgeon: [name], MD;  Location: [place];  Service: ENT;  Laterality: Left;  Marland Kitchen VASECTOMY      Allergies  Allergies  Allergen Reactions  . Adhesive [Tape] Rash    Paper tape okay  . Coumadin [Warfarin] Rash    History of Present Illness    Bobby Flowers is a 69 y.o. male with a hx of PAF, MV prolapse, hypothyroidism, dilated aortic root, chest pain with normal coronary arteries by catheterization April 2019  last seen 08/2018 by Ignacia Bayley, NP.  His atrial fibrillation dates back to the 1980s. He was on Warfarin at one point, but had allergic reaction with rash. CHADS2VASC of 1 due to his age, but he has not been anticoagulated. He is on chronic amiodarone. Previous eval in atrial fibrillation clinic in 2015 and felt that given stability on low-dose amiodarone, continued medical therapy was appropriate.   April 2019 reported symptoms of unstable angina with LHC revealing normal coronary arteries. Echo May 2019 with normal LVEF and gr2DD. Dose of Amiodarone titration to 200mg  daily in April 2019 due to increased frequency atrial fib with improvement in symptoms.   Clinic visit 08/2018 his Atenolol was reduced to 50mg  due to bradycardia rate 49bpm. Subsequent CT chest 09/05/18 showed 4.5 cm ascending thoracic aorta. He was referred to cardiothoracic surgery and has established with Dr. Cyndia Bent.   Presents today feeling well. He had back surgery at the end of April. Tells me the pain up the back of his leg has gone away. But still with significant pain with lifting his right leg. He has an MRI upcoming and has been told he may require additional back surgery. This has limited his activity level, but has maintained stable weight over the last year.   Reports palpitations "every once in awhile". These are overall not bothersome and not associated with shortness of breath nor chest pain.    EKGs/Labs/Other Studies Reviewed:   The following studies were reviewed today:  CT chest 08/2018 IMPRESSION: 4.5 cm ascending thoracic aortic aneurysm is noted. Ascending thoracic aortic aneurysm. Recommend semi-annual imaging followup by CTA or MRA and referral to cardiothoracic surgery if not already obtained. This recommendation follows 2010 ACCF/AHA/AATS/ACR/ASA/SCA/SCAI/SIR/STS/SVM Guidelines for the Diagnosis and Management of Patients With Thoracic Aortic Disease. Circulation. 2010; 121: Z610-R604. Aortic  aneurysm NOS (ICD10-I71.9).   Mild subsegmental atelectasis is noted in the left lingular segment and left lower lobe.   Emphysema (ICD10-J43.9).  EKG:  EKG is ordered today.  The ekg ordered today demonstrates sinus pericardia 54 bpm with first-degree AV block (PR 264), poor R wave progression-no acute ST/T wave changes.  Recent Labs: 08/29/2018: ALT 13; TSH 9.370 06/27/2019: BUN 17; Creatinine, Ser 0.97; Hemoglobin 16.0; Platelets 195; Potassium 4.3; Sodium 141  Recent Lipid Panel No results found for: CHOL, TRIG, HDL, CHOLHDL, VLDL, LDLCALC, LDLDIRECT  Home Medications   Current Meds  Medication Sig  . amiodarone (PACERONE) 200 MG tablet Take 1 tablet (200 mg total) by mouth daily.  Marland Kitchen atenolol (TENORMIN) 100 MG tablet Take 0.5 tablets (50 mg total) by mouth daily.  Marland Kitchen gabapentin (NEURONTIN) 300 MG capsule Take 300-600 mg by mouth See admin instructions. 300 mg in the morning, 600 mg at bedtime  . latanoprost (XALATAN) 0.005 % ophthalmic solution Place 1 drop into both eyes at bedtime.   Marland Kitchen levothyroxine (SYNTHROID, LEVOTHROID) 75 MCG tablet Take 75 mcg by mouth See admin instructions. Monday through Saturday  .  oxyCODONE (ROXICODONE) 5 MG immediate release tablet Take 1-2 tablets (5-10 mg total) by mouth every 4 (four) hours as needed for moderate pain or severe pain (take 5mg  for moderate pain, 10mg  for severe pain).  . SUMAtriptan (IMITREX) 100 MG tablet Take 100 mg by mouth every 2 (two) hours as needed for migraine. May repeat in 2 hours if headache persists or recurs.      Review of Systems     Review of Systems  Constitutional: Negative for chills, fever and malaise/fatigue.  Cardiovascular: Negative for chest pain, dyspnea on exertion, leg swelling, near-syncope, orthopnea, palpitations and syncope.  Respiratory: Negative for cough, shortness of breath and wheezing.   Musculoskeletal:       (+) right leg pain  Gastrointestinal: Negative for nausea and vomiting.   Neurological: Negative for dizziness, light-headedness and weakness.   All other systems reviewed and are otherwise negative except as noted above.  Physical Exam    VS:  BP 108/70 (BP Location: Left Arm, Patient Position: Sitting, Cuff Size: Normal)   Pulse (!) 54   Ht 5\' 9"  (1.753 m)   Wt 178 lb 4 oz (80.9 kg)   SpO2 95%   BMI 26.32 kg/m  , BMI Body mass index is 26.32 kg/m. GEN: Well nourished, well developed, in no acute distress. HEENT: normal. Neck: Supple, no JVD, carotid bruits, or masses. Cardiac: RRR, no murmurs, rubs, or gallops. No clubbing, cyanosis, edema. Radials/DP/PT 2+ and equal bilaterally.  Respiratory:  Respirations regular and unlabored, clear to auscultation bilaterally. GI: Soft, nontender, nondistended, BS + x 4. MS: No deformity or atrophy. Skin: Warm and dry, no rash. Neuro:  Strength and sensation are intact. Psych: Normal affect.  Assessment & Plan    1. PAF - Reports few palpitations. Continue Amiodarone 200 mg daily. Due to bradycardia, reduce Atenolol to 25mg  daily. CHADS2VASc of 1 for age of 59. Not presently on anticoagulation (rash with coumadin in the 80s). Follow up amiodarone labs including TSH, T4, CBC, CMET. Confirms he gets annual eye exam. Defer PFTs at this time due to coronoavirus pandemic.   2. Dilated ascending aorta - 4.5 cm by CT last year. Established with Dr. Cyndia Bent of [facility]. Encouraged to set up annual scan/appointment and he was given phone number to schedule.  3. MV prolapse - Mild by echo 2019. Asymptomatic. Consider repeat echo if symptoms arise.  4. Hypothyroidism - Continue to follow with PCP. TSH, T4 for monitoring on amiodarone.   Disposition: Follow up in 1 year(s) with Dr. Fletcher Anon or APP   Loel Dubonnet, NP 08/23/2019, 3:25 PM

## 2019-08-27 ENCOUNTER — Telehealth: Payer: Self-pay

## 2019-08-27 DIAGNOSIS — Z79899 Other long term (current) drug therapy: Secondary | ICD-10-CM

## 2019-08-27 DIAGNOSIS — I4819 Other persistent atrial fibrillation: Secondary | ICD-10-CM

## 2019-08-27 NOTE — Telephone Encounter (Signed)
lmtcb with the Caitlin's message below. Lab orders placed in Epic to [facility]. (cmet, cbc, tsh, free t4). Patient is to call the office to confirm the message was received.

## 2019-08-27 NOTE — Telephone Encounter (Signed)
-----   Message from Loel Dubonnet, NP sent at 08/27/2019  7:43 AM EDT ----- Hello,  Received a note from [facility] that Bobby Flowers lab was inadvertently left in the centrifuge over the weekend so unable to be utilized.  Lenda Kelp deleted the charge for labs from that encounter for me already. The labs were PAF/amiodarone monitoring labs (CMET, CBC, TSH, Free T4).  Can we please call Bobby Flowers to apologize and ask if he will return to the [facility] for lab collection? He does have an appointment 09/23/19 with pain management and he could stop by that day if convenient. Labs do not need to be fasting.  Thanks,  Loel Dubonnet, NP

## 2019-08-28 NOTE — Telephone Encounter (Signed)
Patients labs were drawn yesterday.

## 2019-09-05 ENCOUNTER — Ambulatory Visit
Admission: RE | Admit: 2019-09-05 | Discharge: 2019-09-05 | Disposition: A | Discharge status: Home / Self Care | Payer: Medicare HMO | Source: Ambulatory Visit | Attending: Nurse Practitioner | Admitting: Nurse Practitioner

## 2019-09-05 ENCOUNTER — Other Ambulatory Visit: Payer: Self-pay

## 2019-09-05 DIAGNOSIS — Z9889 Other specified postprocedural states: Secondary | ICD-10-CM | POA: Diagnosis not present

## 2019-09-13 ENCOUNTER — Other Ambulatory Visit: Payer: Self-pay | Admitting: Surgery

## 2019-09-13 DIAGNOSIS — I712 Thoracic aortic aneurysm, without rupture, unspecified: Secondary | ICD-10-CM

## 2019-09-20 ENCOUNTER — Other Ambulatory Visit: Payer: Self-pay | Admitting: Neurosurgery

## 2019-09-22 NOTE — Progress Notes (Signed)
Patient: Bobby Flowers  Service Category: E/M  Provider: Gaspar Cola, MD  DOB: 1950/12/23  DOS: 09/23/2019  Referring Provider: Deetta Perla, MD  MRN: [id number]  Setting: Ambulatory outpatient  PCP: [name], MD  Type: New Patient  Specialty: Interventional Pain Management    Location: Office  Delivery: Face-to-face     Primary Reason(s) for Visit: Encounter for initial evaluation of one or more chronic problems (new to examiner) potentially causing chronic pain, and posing a threat to normal musculoskeletal function. (Level of risk: High) CC: Leg Pain (right)  HPI  Bobby Flowers is a 69 y.o. year old, male patient, who comes today to see [place] for the first time for an initial evaluation of his chronic pain. 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, L4-5); Abnormal MRI, lumbar spine (09/06/2019); Lumbar Grade 1 Anterolisthesis of L3/L4 and L4/L5 (6 mm); Bertolotti's syndrome (Right); Lumbar Levoscoliosis; Kidney cyst (12 mm) (Right); Lumbar facet hypertrophy (Multilevel) (L1-2, L2-3, L3-4, L4-5) (Bilateral); DDD (degenerative disc disease), lumbosacral; Displacement of lumbar intervertebral disc (Protrusion: T12-L1, L2-3, L4-5, L5-S1) (Bulging: 12,23, L3-4, L4-5, and L5-S1); Lumbosacral lateral recess stenosis (Right: L2-3, L3-4) (Left: L5-S1) (Bilateral: L4-5); Lumbar foraminal stenosis (Right: L3-4) (Left: L5-S1) (Bilateral: L4-5); and 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 Today he comes in for evaluation of his Leg Pain (right)  Pain Assessment: Location: Right Leg Radiating: Pain radiaties down right hip to front of right leg to below knee Onset: More than a month ago Duration: Chronic pain Quality: Numbness, Aching, Sharp, Stabbing Severity: 2  (sitting 2 and walking 8)/10 (subjective, self-reported pain score)  Note: Reported level is compatible with observation.                         When using our objective Pain Scale, levels between 6 and 10/10 are said to belong in an emergency room, as it progressively worsens from a 6/10, described as severely limiting, requiring emergency care not usually available at an outpatient pain management facility. At a 6/10 level, communication becomes difficult and requires great effort. Assistance to reach the emergency department may be required. Facial flushing and profuse sweating along with potentially dangerous increases in heart rate and blood pressure will be evident. Effect on ADL: procedure, sitting and meds Timing: Constant Modifying factors: limits my daily activities BP: 106/74   HR: 61  Onset and Duration: Gradual and Present longer than 3 months Cause of pain: Unknown Severity: Getting worse, NAS-11 at its worse: 8/10, NAS-11 at its best: 1/10, NAS-11 now: 5/10 and NAS-11 on the average: 7/10 Timing: Not influenced by the time of the day and During activity or exercise Aggravating Factors: Kneeling, Motion, Prolonged standing and Squatting Alleviating Factors: Resting and Sitting Associated Problems: Numbness, Weakness and Pain that wakes patient up Quality of Pain: Aching, Annoying and Intermittent Previous Examinations or Tests: MRI scan Previous Treatments: Epidural steroid injections, Physical Therapy and Strengthening exercises  The patient comes into the clinics today for the first time for a chronic pain management evaluation.  According to the patient he had back surgery around Jun 30, 2019 by Dr. Lacinda Axon.  He is currently experiencing right lower extremity pain in the area of the buttocks and a hip.  Hip flexion makes the upper thigh hurts.  Sitting makes the pain go away.  Prolonged standing or walking causes thigh pain and numbness.  According to the patient this started on the left side before the surgery and has now moved onto the right side.  There is a lumbar MRI that was done recently, after the surgery, that would suggest that there is some severe residual spinal stenosis.  The patient comes into the clinic to see if there is anything that we can do to completely eliminate the pain and prevent the surgery.  After carefully evaluating the case, it is my impression that anything that I have to offer him will be temporary and he will probably end up requiring the surgery.  I reminded him that none of [place] are getting any younger and if he is planning on having any surgery he may be a better idea to have it sooner than later.  He stated to me that he is already scheduled to undergo the surgery around October 21, 2019, again by Dr. Deetta Perla.  Historic Controlled Substance Pharmacotherapy Review  PMP and historical list of controlled substances: Oxycodone IR 5 mg 1 tablet every 6 hours (20 mg/day of oxycodone) (30 MME/day) Current opioid analgesics:  Oxycodone IR 5 mg 1 tablet every 6 hours (20 mg/day of oxycodone) (30 MME/day)  MME/day: 30 mg/day  Historical Monitoring: The patient  reports no history of drug use. List of all UDS Test(s): No results found. List of other Serum/Urine Drug Screening Test(s):  No results found. Historical Background Evaluation: [place] PMP: PDMP reviewed during this encounter. Online review of the past 79-monthperiod conducted.             PMP NARX Score Report:  Narcotic: 250 Sedative: 110 Stimulant: 000  Department of public safety, offender search: (Editor, commissioningInformation)  Non-contributory Risk Assessment Profile: Aberrant behavior: None observed or detected today Risk factors for fatal opioid overdose: None identified today PMP NARX Overdose Risk Score: 300 Fatal overdose hazard ratio (HR): Calculation deferred Non-fatal overdose hazard ratio (HR): Calculation deferred Risk of opioid abuse or dependence: 0.7-3.0% with doses ? 36 MME/day and 6.1-26% with doses ? 120 MME/day. Substance use disorder (SUD) risk level: See below Personal History of Substance Abuse (SUD-Substance use disorder):  Alcohol: Negative  Illegal Drugs: Negative  Rx Drugs: Negative  ORT Risk Level calculation: Low Risk  Opioid Risk Tool - 09/23/19 1022      Family History of Substance Abuse   Alcohol Positive Male    Illegal Drugs Negative    Rx Drugs Negative      Personal History of Substance Abuse   Alcohol Negative    Illegal Drugs Negative    Rx Drugs Negative      Age   Age between 17-45years  No      History of Preadolescent Sexual Abuse   History of Preadolescent Sexual Abuse Negative or Male      Psychological Disease   Psychological Disease Negative    Depression Negative      Total Score   Opioid Risk Tool Scoring 3    Opioid Risk Interpretation Low Risk          ORT Scoring interpretation table:  Score <3 = Low Risk for SUD  Score between  4-7 = Moderate Risk for SUD  Score >8 = High Risk for Opioid Abuse   Pharmacologic Plan: None at this point.            Initial impression: The patient indicated having no interest, at this time.  Meds   Current Outpatient Medications:    amiodarone (PACERONE) 200 MG tablet, Take 1 tablet (200 mg total) by mouth daily., Disp: 90 tablet, Rfl: 3   atenolol (TENORMIN) 25 MG tablet, Take 1 tablet (25 mg total) by mouth daily. Patient does not need right now, but will let you know when he does. Thank you!, Disp: 90 tablet, Rfl: 3   gabapentin (NEURONTIN) 300 MG capsule, Take 300-600 mg by mouth See admin  instructions. 300 mg in the morning, 600 mg at bedtime, Disp: , Rfl: 0   latanoprost (XALATAN) 0.005 % ophthalmic solution, Place 1 drop into both eyes at bedtime. , Disp: , Rfl:    levothyroxine (SYNTHROID, LEVOTHROID) 75 MCG tablet, Take 75 mcg by mouth See admin instructions. Monday through Saturday, Disp: , Rfl:    oxyCODONE (ROXICODONE) 5 MG immediate release tablet, Take 1-2 tablets (5-10 mg total) by mouth every 4 (four) hours as needed for moderate pain or severe pain (take 36m for moderate pain, 172mfor severe pain)., Disp: 30 tablet, Rfl: 0   SUMAtriptan (IMITREX) 100 MG tablet, Take 100 mg by mouth every 2 (two) hours as needed for migraine. May repeat in 2 hours if headache persists or recurs., Disp: , Rfl:   Imaging Review  Lumbosacral Imaging: Lumbar MR wo contrast: Results for orders placed during the hospital encounter of 09/05/19 MR LUMBAR SPINE WO CONTRAST  Narrative CLINICAL DATA:  Low back pain with right leg pain, numbness, and burning. Lumbar laminectomy on 07/01/2019.  EXAM: MRI LUMBAR SPINE WITHOUT CONTRAST  TECHNIQUE: Multiplanar, multisequence MR imaging of the lumbar spine was performed. No intravenous contrast was administered.  COMPARISON:  05/27/2019  FINDINGS: Segmentation: As on the prior MRI, the lowest fully formed disc space is designated L5-S1. There is a pseudoarticulation between the right L5 transverse process and the sacrum, and there are no ribs at T12.  Alignment: Mild lumbar levoscoliosis. Unchanged trace anterolisthesis of L3 on L4 and 6 mm anterolisthesis of L4 on L5.  Vertebrae: No fracture, suspicious osseous lesion, or evidence of discitis. Modic type 2 degenerative endplate changes at L3T1-1nd L5-S1 as well as low level Modic type 1 changes at L3-4.  Conus medullaris and cauda equina: Conus extends to the L1 level. Conus and cauda equina appear normal.  Paraspinal and other soft tissues: Unchanged 12 mm T2  hyperintense focus in the right kidney compatible with a cyst. Postoperative changes in the posterior lumbar soft tissues. No fluid collection.  Disc levels:  Disc desiccation throughout the lumbar spine. Severe asymmetric right-sided disc space narrowing at L3-4, asymmetric left-sided disc space narrowing at L5-S1, and mild disc space narrowing at L4-5.  T12-L1: Small central disc protrusion without stenosis, unchanged.  L1-2: Mild disc bulging and mild facet hypertrophy without stenosis, unchanged.  L2-3: Mild disc bulging, a small right subarticular disc protrusion, and mild facet hypertrophy result in mild right lateral recess stenosis without spinal or neural foraminal stenosis, unchanged.  L3-4: Interval right laminectomy. Circumferential disc bulging slightly eccentric to the right and moderate facet hypertrophy result in mild to moderate residual right lateral recess and right neural foraminal stenosis. No spinal stenosis.  L4-5: Interval right laminectomy. Anterolisthesis with bulging uncovered disc, a broad  central pseudo disc protrusion, and mild-to-moderate right and severe left facet hypertrophy result in severe residual spinal stenosis at the level of the superior aspect of the disc space, left greater than right lateral recess stenosis, and mild bilateral neural foraminal stenosis. Potential bilateral L5 nerve root impingement.  L5-S1: Disc bulging, a broad left subarticular disc protrusion, and endplate spurring result in moderate left lateral recess stenosis with posterior displacement of the left S1 nerve root and mild-to-moderate left neural foraminal stenosis, unchanged. No spinal stenosis.  IMPRESSION: 1. Interval laminectomies at L3-4 and L4-5. Severe residual spinal stenosis and left greater than right lateral recess stenosis at L4-5. 2. Unchanged left subarticular disc protrusion at L5-S1. 3. Unchanged disc degeneration elsewhere as  above.  Electronically Signed By: Logan Bores M.D. On: 09/06/2019 09:29  Lumbar DG 2-3 views: Results for orders placed during the hospital encounter of 07/01/19 DG Lumbar Spine 2-3 Views  Narrative CLINICAL DATA:  Back surgery  EXAM: LUMBAR SPINE - 2-3 VIEW; DG C-ARM 1-60 MIN  COMPARISON:  None.  FINDINGS: Multiple intraoperative fluoroscopic spot images are provided. Interval discectomy. Lower lumbar spine spondylosis.  FLUOROSCOPY TIME:  13 seconds  IMPRESSION: Intraoperative localization.  Electronically Signed By: Kathreen Devoid On: 07/01/2019 11:21  Complexity Note: Imaging results reviewed. Results shared with Bobby Flowers, using Layman's terms.                        ROS  Cardiovascular: Abnormal heart rhythm, Daily Aspirin intake, Heart murmur and Blood thinners:  Anticoagulant Pulmonary or Respiratory: No reported pulmonary signs or symptoms such as wheezing and difficulty taking a deep full breath (Asthma), difficulty blowing air out (Emphysema), coughing up mucus (Bronchitis), persistent dry cough, or temporary stoppage of breathing during sleep Neurological: No reported neurological signs or symptoms such as seizures, abnormal skin sensations, urinary and/or fecal incontinence, being born with an abnormal open spine and/or a tethered spinal cord Psychological-Psychiatric: No reported psychological or psychiatric signs or symptoms such as difficulty sleeping, anxiety, depression, delusions or hallucinations (schizophrenial), mood swings (bipolar disorders) or suicidal ideations or attempts Gastrointestinal: No reported gastrointestinal signs or symptoms such as vomiting or evacuating blood, reflux, heartburn, alternating episodes of diarrhea and constipation, inflamed or scarred liver, or pancreas or irrregular and/or infrequent bowel movements Genitourinary: Passing kidney stones Hematological: No reported hematological signs or symptoms such as prolonged bleeding, low  or poor functioning platelets, bruising or bleeding easily, hereditary bleeding problems, low energy levels due to low hemoglobin or being anemic Endocrine: Slow thyroid Rheumatologic: No reported rheumatological signs and symptoms such as fatigue, joint pain, tenderness, swelling, redness, heat, stiffness, decreased range of motion, with or without associated rash Musculoskeletal: Negative for myasthenia gravis, muscular dystrophy, multiple sclerosis or malignant hyperthermia Work History: Out of work due to pain  Allergies  Bobby Flowers is allergic to adhesive [tape] and coumadin [warfarin].  Laboratory Chemistry Profile   Renal Lab Results  Component Value Date   BUN 17 06/27/2019   CREATININE 0.97 06/27/2019   BCR 26 (H) 08/29/2018   GFRAA >60 06/27/2019   GFRNONAA >60 06/27/2019   SPECGRAV 1.020 08/10/2017   PHUR 5.5 08/10/2017   PROTEINUR NEGATIVE 06/27/2019     Electrolytes Lab Results  Component Value Date   NA 141 06/27/2019   K 4.3 06/27/2019   CL 103 06/27/2019   CALCIUM 9.1 06/27/2019     Hepatic Lab Results  Component Value Date   AST 13 08/29/2018   ALT  13 08/29/2018   ALBUMIN 4.4 08/29/2018   ALKPHOS 72 08/29/2018     ID Lab Results  Component Value Date   SARSCOV2NAA NEGATIVE 06/27/2019   STAPHAUREUS NEGATIVE 06/27/2019   MRSAPCR NEGATIVE 06/27/2019     Bone No results found for: VD25OH, HT342AJ6OTL, XB2620BT5, HR4163AG5, 25OHVITD1, 25OHVITD2, 25OHVITD3, TESTOFREE, TESTOSTERONE   Endocrine Lab Results  Component Value Date   GLUCOSE 80 06/27/2019   GLUCOSEU NEGATIVE 06/27/2019   TSH 9.370 (H) 08/29/2018     Neuropathy No results found for: VITAMINB12, FOLATE, HGBA1C, HIV   CNS No results found for: COLORCSF, APPEARCSF, RBCCOUNTCSF, WBCCSF, POLYSCSF, LYMPHSCSF, EOSCSF, PROTEINCSF, GLUCCSF, JCVIRUS, CSFOLI, IGGCSF, LABACHR, ACETBL, LABACHR, ACETBL   Inflammation (CRP: Acute   ESR: Chronic) No results found for: CRP, ESRSEDRATE, LATICACIDVEN    Rheumatology No results found for: RF, ANA, LABURIC, URICUR, LYMEIGGIGMAB, LYMEABIGMQN, HLAB27   Coagulation Lab Results  Component Value Date   INR 1.0 06/27/2019   LABPROT 12.5 06/27/2019   APTT 33 06/27/2019   PLT 195 06/27/2019     Cardiovascular Lab Results  Component Value Date   HGB 16.0 06/27/2019   HCT 47.9 06/27/2019     Screening Lab Results  Component Value Date   SARSCOV2NAA NEGATIVE 06/27/2019   STAPHAUREUS NEGATIVE 06/27/2019   MRSAPCR NEGATIVE 06/27/2019     Cancer No results found for: CEA, CA125, LABCA2   Allergens No results found for: ALMOND, APPLE, ASPARAGUS, AVOCADO, BANANA, BARLEY, BASIL, BAYLEAF, GREENBEAN, LIMABEAN, WHITEBEAN, BEEFIGE, REDBEET, BLUEBERRY, BROCCOLI, CABBAGE, MELON, CARROT, CASEIN, CASHEWNUT, CAULIFLOWER, CELERY     Note: Lab results reviewed.  PFSH  Drug: Bobby Flowers  reports no history of drug use. Alcohol:  reports no history of alcohol use. Tobacco:  reports that he quit smoking about 19 years ago. His smoking use included cigarettes. He has a 46.50 pack-year smoking history. He has never used smokeless tobacco. Medical:  has a past medical history of Chest tightness, Dilated aortic root ([place]), Dysrhythmia, Headache, Hypothyroidism, Mitral valve prolapse, Nephrolithiasis, PAF (paroxysmal atrial fibrillation) ([place]), and Wears contact lenses. Family: family history includes Alcoholism in his brother; Heart failure in his mother; Leukemia in his father.  Past Surgical History:  Procedure Laterality Date   CARDIAC CATHETERIZATION     COLONOSCOPY  04/25/05   COLONOSCOPY WITH PROPOFOL N/A 07/22/2015   Procedure: COLONOSCOPY WITH PROPOFOL;  Surgeon: Robert Bellow, MD;  [facility] END[facility]COPY;  Service: Endoscopy;  Laterality: N/A;   CYSTOSCOPY WITH HOLMIUM LASER LITHOTRIPSY Bilateral 07/28/2017   Procedure: CYSTOSCOPY WITH HOLMIUM LASER LITHOTRIPSY;  Surgeon: Lucas Mallow, MD;  Location: [facility] ORS;  Service: Urology;   Laterality: Bilateral;   CYSTOSCOPY WITH STENT PLACEMENT Bilateral 07/28/2017   Procedure: CYSTOSCOPY WITH STENT PLACEMENT;  Surgeon: Lucas Mallow, MD;  Location: [facility] ORS;  Service: Urology;  Laterality: Bilateral;   CYSTOSCOPY WITH URETEROSCOPY Bilateral 07/28/2017   Procedure: CYSTOSCOPY WITH URETEROSCOPY;  Surgeon: Lucas Mallow, MD;  Location: [facility] ORS;  Service: Urology;  Laterality: Bilateral;   EYE SURGERY Left    cataract surgery   HEMORROIDECTOMY     HERNIA REPAIR Left May 22, 2015   Left inguinal hernia repair with medium Ultra Pro mesh   INGUINAL HERNIA REPAIR Left 07/22/2015   Procedure: HERNIA REPAIR INGUINAL ADULT;  Surgeon: Robert Bellow, MD;  Location: [facility] ORS;  Service: General;  Laterality: Left;   LEFT HEART CATH AND CORONARY ANGIOGRAPHY Left 05/29/2017   Procedure: LEFT HEART CATH AND CORONARY ANGIOGRAPHY;  Surgeon:  [name], MD;  Location: [place] CV LAB;  Service: Cardiovascular;  Laterality: Left;   LUMBAR LAMINECTOMY/DECOMPRESSION MICRODISCECTOMY Right 07/01/2019   Procedure: OPEN L4 LAMINECTOMY, L3/4 DISCECTOMY ON RIGHT, L4/5 DISCECTOMY;  Surgeon: Deetta Perla, MD;  Location: [facility] ORS;  Service: Neurosurgery;  Laterality: Right;   myringotomy     with tubes x 5.  tubes fall out frequently   MYRINGOTOMY WITH TUBE PLACEMENT Left 12/13/2018   Procedure: MYRINGOTOMY WITH T-TUBE PLACEMENT;  Surgeon: [name], MD;  Location: [place];  Service: ENT;  Laterality: Left;  needs to stay first case vaught / juengel doing case together   NASAL SINUS SURGERY     NASOPHARYNGOSCOPY EUSTATION TUBE BALLOON DILATION Left 12/13/2018   Procedure: NASOPHARYNGOSCOPY EUSTATION TUBE BALLOON DILATION WITH OUTFRACTURE OF TURBINATES;  Surgeon: [name], MD;  Location: [place];  Service: ENT;  Laterality: Left;   VASECTOMY     Active Ambulatory Problems    Diagnosis Date Noted   Persistent atrial fibrillation  ([place]) 08/17/2016   Mitral valve prolapse 08/17/2016   Avulsion fracture 03/15/2015   Tendonitis 03/15/2015   Encounter for screening colonoscopy 07/01/2015   Left inguinal hernia 07/11/2015   Unstable angina ([place])    Ureteral calculus 07/28/2017   Age-related nuclear cataract of both eyes 12/14/2016   Migraine 04/17/2018   Primary open angle glaucoma (POAG) of both eyes, moderate stage 12/14/2016   Thyroid disease 04/17/2018   Nephrolithiasis 11/09/2018   Chronic pain syndrome 09/23/2019   Pharmacologic therapy 09/23/2019   Disorder of skeletal system 09/23/2019   Problems influencing health status 09/23/2019   Chronic lower extremity pain (1ry area of Pain) (Right) 09/24/2019   Lumbar radiculopathy (Right) 09/24/2019   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, L4-5) 09/24/2019   Abnormal MRI, lumbar spine (09/06/2019) 09/24/2019   Lumbar Grade 1 Anterolisthesis of L3/L4 and L4/L5 (6 mm) 09/24/2019   Bertolotti's syndrome (Right) 09/24/2019   Lumbar Levoscoliosis 09/24/2019   Kidney cyst (12 mm) (Right) 09/24/2019   Lumbar facet hypertrophy (Multilevel) (L1-2, L2-3, L3-4, L4-5) (Bilateral) 09/24/2019   DDD (degenerative disc disease), lumbosacral 09/24/2019   Displacement of lumbar intervertebral disc (Protrusion: T12-L1, L2-3, L4-5, L5-S1) (Bulging: 12,23, L3-4, L4-5, and L5-S1) 09/24/2019   Lumbosacral lateral recess stenosis (Right: L2-3, L3-4) (Left: L5-S1) (Bilateral: L4-5) 09/24/2019   Lumbar foraminal stenosis (Right: L3-4) (Left: L5-S1) (Bilateral: L4-5) 09/24/2019   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) 09/24/2019   Resolved Ambulatory Problems    Diagnosis Date Noted   No Resolved Ambulatory Problems   Past Medical History:  Diagnosis Date   Chest tightness    Dilated aortic root ([facility])    Dysrhythmia    Headache    Hypothyroidism    PAF (paroxysmal atrial fibrillation) ([place])    Wears contact  lenses    Constitutional Exam  General appearance: Well nourished, well developed, and well hydrated. In no apparent acute distress Vitals:   09/23/19 1009  BP: 106/74  Pulse: 61  Temp: 98.4 F (36.9 C)  SpO2: 99%  Weight: 183 lb (83 kg)  Height: 5' 9"  (1.753 m)   BMI Assessment: Estimated body mass index is 27.02 kg/m as calculated from the following:   Height as of this encounter: 5' 9"  (1.753 m).   Weight as of this encounter: 183 lb (83 kg).  BMI interpretation table: BMI level Category Range association with higher incidence of chronic pain  <18 kg/m2 Underweight   18.5-24.9 kg/m2 Ideal body weight  25-29.9 kg/m2 Overweight Increased incidence by 20%  30-34.9 kg/m2 Obese (Class I) Increased incidence by 68%  35-39.9 kg/m2 Severe obesity (Class II) Increased incidence by 136%  >40 kg/m2 Extreme obesity (Class III) Increased incidence by 254%   Patient's current BMI Ideal Body weight  Body mass index is 27.02 kg/m. Ideal body weight: 70.7 kg (155 lb 13.8 oz) Adjusted ideal body weight: 75.6 kg (166 lb 11.5 oz)   BMI Readings from Last 4 Encounters:  09/23/19 27.02 kg/m  08/23/19 26.32 kg/m  07/01/19 25.99 kg/m  12/13/18 26.14 kg/m   Wt Readings from Last 4 Encounters:  09/23/19 183 lb (83 kg)  08/23/19 178 lb 4 oz (80.9 kg)  07/01/19 176 lb (79.8 kg)  12/13/18 177 lb (80.3 kg)   Assessment  Primary Diagnosis & Pertinent Problem List: The primary encounter diagnosis was Chronic pain syndrome. 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  Visit Diagnosis (New problems to examiner): 1. Chronic pain syndrome   2. Chronic lower extremity pain (1ry area of Pain) (Right)   3. Lumbar foraminal stenosis (Right: L3-4) (Left: L5-S1) (Bilateral: L4-5)   4. Lumbar radiculopathy (Right)   5. 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 Lumbar facet hypertrophy (Multilevel) (L1-2, L2-3, L3-4, L4-5) (Bilateral)   8. Displacement of lumbar intervertebral disc   9. DDD (degenerative disc disease), lumbosacral   10. Abnormal MRI, lumbar spine (09/06/2019)   11. Bertolotti's syndrome (Right)   12. Lumbar Levoscoliosis   13. Lumbosacral lateral recess stenosis (Right: L2-3, L3-4) (Left: L5-S1) (Bilateral: L4-5)   14. Kidney cyst (12 mm) (Right)   15. Pharmacologic therapy   16. Disorder of skeletal system   17. Problems influencing health status    Plan of Care (Initial workup plan)  Note: Mr. Bisig was reminded that as per protocol, today's visit has been an evaluation only. We have not taken over the patient's controlled substance management.  Problem-specific plan: No problem-specific Assessment & Plan notes found for this encounter.  Lab Orders  No laboratory test(s) ordered today   Imaging Orders  No imaging studies ordered today   Referral Orders  No referral(s) requested today   Procedure Orders    No procedure(s) ordered today   Pharmacotherapy (current): Medications ordered:  No orders of the defined types were placed in this encounter.  Medications administered during this visit: Bobby Flowers had no medications  administered during this visit.   Pharmacological management options:  Opioid Analgesics: None prescribed at this time.  Membrane stabilizer: I will not be taking over Mr. Kopke medication regimen  Muscle relaxant: I will not be taking over Mr. Esper medication regimen  NSAID: I will not be taking over Mr. Voth medication regimen  Other analgesic(s): I will not be taking over Mr. Bress medication regimen   Interventional management options: Procedure(s) under consideration:  Possible right L2-3 LESI  Provider-requested follow-up: No follow-ups on file.  Future Appointments  Date Time Provider Ali Chuk  10/10/2019  8:00 AM [facility] PAT3 [facility] None  10/16/2019 12:00 PM [facility] 1 [facility]  10/16/2019  2:00 PM Gaye Pollack, MD [facility]  10/17/2019  8:00 AM [facility] None  11/11/2019  1:15 PM Stoioff, Ronda Fairly, MD [facility] None    Note by: Gaspar Cola, MD Date: 09/23/2019; Time: 7:08 PM

## 2019-09-23 ENCOUNTER — Other Ambulatory Visit: Payer: Self-pay

## 2019-09-23 ENCOUNTER — Encounter: Payer: Self-pay | Admitting: Pain Medicine

## 2019-09-23 ENCOUNTER — Ambulatory Visit: Payer: Medicare HMO | Attending: Pain Medicine | Admitting: Pain Medicine

## 2019-09-23 VITALS — BP 106/74 | HR 61 | Temp 98.4°F | Ht 69.0 in | Wt 183.0 lb

## 2019-09-23 DIAGNOSIS — M47816 Spondylosis without myelopathy or radiculopathy, lumbar region: Secondary | ICD-10-CM | POA: Diagnosis present

## 2019-09-23 DIAGNOSIS — G8929 Other chronic pain: Secondary | ICD-10-CM | POA: Insufficient documentation

## 2019-09-23 DIAGNOSIS — M48061 Spinal stenosis, lumbar region without neurogenic claudication: Secondary | ICD-10-CM | POA: Insufficient documentation

## 2019-09-23 DIAGNOSIS — Q7649 Other congenital malformations of spine, not associated with scoliosis: Secondary | ICD-10-CM | POA: Diagnosis present

## 2019-09-23 DIAGNOSIS — Z79899 Other long term (current) drug therapy: Secondary | ICD-10-CM | POA: Diagnosis present

## 2019-09-23 DIAGNOSIS — M899 Disorder of bone, unspecified: Secondary | ICD-10-CM | POA: Insufficient documentation

## 2019-09-23 DIAGNOSIS — M4180 Other forms of scoliosis, site unspecified: Secondary | ICD-10-CM | POA: Diagnosis present

## 2019-09-23 DIAGNOSIS — M4807 Spinal stenosis, lumbosacral region: Secondary | ICD-10-CM | POA: Diagnosis present

## 2019-09-23 DIAGNOSIS — Z789 Other specified health status: Secondary | ICD-10-CM | POA: Diagnosis present

## 2019-09-23 DIAGNOSIS — M5416 Radiculopathy, lumbar region: Secondary | ICD-10-CM | POA: Diagnosis present

## 2019-09-23 DIAGNOSIS — M5126 Other intervertebral disc displacement, lumbar region: Secondary | ICD-10-CM | POA: Diagnosis present

## 2019-09-23 DIAGNOSIS — M48062 Spinal stenosis, lumbar region with neurogenic claudication: Secondary | ICD-10-CM | POA: Insufficient documentation

## 2019-09-23 DIAGNOSIS — N281 Cyst of kidney, acquired: Secondary | ICD-10-CM | POA: Diagnosis present

## 2019-09-23 DIAGNOSIS — G894 Chronic pain syndrome: Secondary | ICD-10-CM | POA: Insufficient documentation

## 2019-09-23 DIAGNOSIS — M961 Postlaminectomy syndrome, not elsewhere classified: Secondary | ICD-10-CM | POA: Diagnosis present

## 2019-09-23 DIAGNOSIS — R937 Abnormal findings on diagnostic imaging of other parts of musculoskeletal system: Secondary | ICD-10-CM | POA: Insufficient documentation

## 2019-09-23 DIAGNOSIS — M5137 Other intervertebral disc degeneration, lumbosacral region: Secondary | ICD-10-CM | POA: Diagnosis present

## 2019-09-23 DIAGNOSIS — M79604 Pain in right leg: Secondary | ICD-10-CM | POA: Diagnosis present

## 2019-09-23 NOTE — Progress Notes (Signed)
Safety precautions to be maintained throughout the outpatient stay will include: orient to surroundings, keep bed in low position, maintain call bell within reach at all times, provide assistance with transfer out of bed and ambulation.  

## 2019-09-23 NOTE — Patient Instructions (Signed)
____________________________________________________________________________________________  General Risks and Possible Complications  Patient Responsibilities: It is important that you read this as it is part of your informed consent. It is our duty to inform you of the risks and possible complications associated with treatments offered to you. It is your responsibility as a patient to read this and to ask questions about anything that is not clear or that you believe was not covered in this document.  Patient's Rights: You have the right to refuse treatment. You also have the right to change your mind, even after initially having agreed to have the treatment done. However, under this last option, if you wait until the last second to change your mind, you may be charged for the materials used up to that point.  Introduction: Medicine is not an exact science. Everything in Medicine, including the lack of treatment(s), carries the potential for danger, harm, or loss (which is by definition: Risk). In Medicine, a complication is a secondary problem, condition, or disease that can aggravate an already existing one. All treatments carry the risk of possible complications. The fact that a side effects or complications occurs, does not imply that the treatment was conducted incorrectly. It must be clearly understood that these can happen even when everything is done following the highest safety standards.  No treatment: You can choose not to proceed with the proposed treatment alternative. The "PRO(s)" would include: avoiding the risk of complications associated with the therapy. The "CON(s)" would include: not getting any of the treatment benefits. These benefits fall under one of three categories: diagnostic; therapeutic; and/or palliative. Diagnostic benefits include: getting information which can ultimately lead to improvement of the disease or symptom(s). Therapeutic benefits are those associated with the  successful treatment of the disease. Finally, palliative benefits are those related to the decrease of the primary symptoms, without necessarily curing the condition (example: decreasing the pain from a flare-up of a chronic condition, such as incurable terminal cancer).  General Risks and Complications: These are associated to most interventional treatments. They can occur alone, or in combination. They fall under one of the following six (6) categories: no benefit or worsening of symptoms; bleeding; infection; nerve damage; allergic reactions; and/or death. 1. No benefits or worsening of symptoms: In Medicine there are no guarantees, only probabilities. No healthcare provider can ever guarantee that a medical treatment will work, they can only state the probability that it may. Furthermore, there is always the possibility that the condition may worsen, either directly, or indirectly, as a consequence of the treatment. 2. Bleeding: This is more common if the patient is taking a blood thinner, either prescription or over the counter (example: Goody Powders, Fish oil, Aspirin, Garlic, etc.), or if suffering a condition associated with impaired coagulation (example: Hemophilia, cirrhosis of the liver, low platelet counts, etc.). However, even if you do not have one on these, it can still happen. If you have any of these conditions, or take one of these drugs, make sure to notify your treating physician. 3. Infection: This is more common in patients with a compromised immune system, either due to disease (example: diabetes, cancer, human immunodeficiency virus [HIV], etc.), or due to medications or treatments (example: therapies used to treat cancer and rheumatological diseases). However, even if you do not have one on these, it can still happen. If you have any of these conditions, or take one of these drugs, make sure to notify your treating physician. 4. Nerve Damage: This is more common when the   treatment is  an invasive one, but it can also happen with the use of medications, such as those used in the treatment of cancer. The damage can occur to small secondary nerves, or to large primary ones, such as those in the spinal cord and brain. This damage may be temporary or permanent and it may lead to impairments that can range from temporary numbness to permanent paralysis and/or brain death. 5. Allergic Reactions: Any time a substance or material comes in contact with our body, there is the possibility of an allergic reaction. These can range from a mild skin rash (contact dermatitis) to a severe systemic reaction (anaphylactic reaction), which can result in death. 6. Death: In general, any medical intervention can result in death, most of the time due to an unforeseen complication. ____________________________________________________________________________________________  ____________________________________________________________________________________________  Preparing for Procedure with Sedation  Procedure appointments are limited to planned procedures: . No Prescription Refills. . No disability issues will be discussed. . No medication changes will be discussed.  Instructions: . Oral Intake: Do not eat or drink anything for at least 8 hours prior to your procedure. (Exception: Blood Pressure Medication. See below.) . Transportation: Unless otherwise stated by your physician, you may drive yourself after the procedure. . Blood Pressure Medicine: Do not forget to take your blood pressure medicine with a sip of water the morning of the procedure. If your Diastolic (lower reading)is above 100 mmHg, elective cases will be cancelled/rescheduled. . Blood thinners: These will need to be stopped for procedures. Notify our staff if you are taking any blood thinners. Depending on which one you take, there will be specific instructions on how and when to stop it. . Diabetics on insulin: Notify the staff  so that you can be scheduled 1st case in the morning. If your diabetes requires high dose insulin, take only  of your normal insulin dose the morning of the procedure and notify the staff that you have done so. . Preventing infections: Shower with an antibacterial soap the morning of your procedure. . Build-up your immune system: Take 1000 mg of Vitamin C with every meal (3 times a day) the day prior to your procedure. . Antibiotics: Inform the staff if you have a condition or reason that requires you to take antibiotics before dental procedures. . Pregnancy: If you are pregnant, call and cancel the procedure. . Sickness: If you have a cold, fever, or any active infections, call and cancel the procedure. . Arrival: You must be in the facility at least 30 minutes prior to your scheduled procedure. . Children: Do not bring children with you. . Dress appropriately: Bring dark clothing that you would not mind if they get stained. . Valuables: Do not bring any jewelry or valuables.  Reasons to call and reschedule or cancel your procedure: (Following these recommendations will minimize the risk of a serious complication.) . Surgeries: Avoid having procedures within 2 weeks of any surgery. (Avoid for 2 weeks before or after any surgery). . Flu Shots: Avoid having procedures within 2 weeks of a flu shots or . (Avoid for 2 weeks before or after immunizations). . Barium: Avoid having a procedure within 7-10 days after having had a radiological study involving the use of radiological contrast. (Myelograms, Barium swallow or enema study). . Heart attacks: Avoid any elective procedures or surgeries for the initial 6 months after a "Myocardial Infarction" (Heart Attack). . Blood thinners: It is imperative that you stop these medications before procedures. Let us know if you   if you take any blood thinner.  . Infection: Avoid procedures during or within two weeks of an infection (including chest colds or  gastrointestinal problems). Symptoms associated with infections include: Localized redness, fever, chills, night sweats or profuse sweating, burning sensation when voiding, cough, congestion, stuffiness, runny nose, sore throat, diarrhea, nausea, vomiting, cold or Flu symptoms, recent or current infections. It is specially important if the infection is over the area that we intend to treat. . Heart and lung problems: Symptoms that may suggest an active cardiopulmonary problem include: cough, chest pain, breathing difficulties or shortness of breath, dizziness, ankle swelling, uncontrolled high or unusually low blood pressure, and/or palpitations. If you are experiencing any of these symptoms, cancel your procedure and contact your primary care physician for an evaluation.  Remember:  Regular Business hours are:  Monday to Thursday 8:00 AM to 4:00 PM  Provider's Schedule: Sabri Teal, MD:  Procedure days: Tuesday and Thursday 7:30 AM to 4:00 PM  Bilal Lateef, MD:  Procedure days: Monday and Wednesday 7:30 AM to 4:00 PM ____________________________________________________________________________________________    

## 2019-09-24 DIAGNOSIS — M4180 Other forms of scoliosis, site unspecified: Secondary | ICD-10-CM | POA: Insufficient documentation

## 2019-09-24 DIAGNOSIS — Q7649 Other congenital malformations of spine, not associated with scoliosis: Secondary | ICD-10-CM | POA: Insufficient documentation

## 2019-09-24 DIAGNOSIS — M47816 Spondylosis without myelopathy or radiculopathy, lumbar region: Secondary | ICD-10-CM | POA: Insufficient documentation

## 2019-09-24 DIAGNOSIS — R937 Abnormal findings on diagnostic imaging of other parts of musculoskeletal system: Secondary | ICD-10-CM | POA: Insufficient documentation

## 2019-09-24 DIAGNOSIS — M961 Postlaminectomy syndrome, not elsewhere classified: Secondary | ICD-10-CM | POA: Insufficient documentation

## 2019-09-24 DIAGNOSIS — M5416 Radiculopathy, lumbar region: Secondary | ICD-10-CM | POA: Insufficient documentation

## 2019-09-24 DIAGNOSIS — M48061 Spinal stenosis, lumbar region without neurogenic claudication: Secondary | ICD-10-CM | POA: Insufficient documentation

## 2019-09-24 DIAGNOSIS — M4310 Spondylolisthesis, site unspecified: Secondary | ICD-10-CM | POA: Insufficient documentation

## 2019-09-24 DIAGNOSIS — G8929 Other chronic pain: Secondary | ICD-10-CM | POA: Insufficient documentation

## 2019-09-24 DIAGNOSIS — N281 Cyst of kidney, acquired: Secondary | ICD-10-CM | POA: Insufficient documentation

## 2019-09-24 DIAGNOSIS — M5126 Other intervertebral disc displacement, lumbar region: Secondary | ICD-10-CM | POA: Insufficient documentation

## 2019-09-24 DIAGNOSIS — M4807 Spinal stenosis, lumbosacral region: Secondary | ICD-10-CM | POA: Insufficient documentation

## 2019-09-24 DIAGNOSIS — M48062 Spinal stenosis, lumbar region with neurogenic claudication: Secondary | ICD-10-CM | POA: Insufficient documentation

## 2019-09-24 DIAGNOSIS — M5137 Other intervertebral disc degeneration, lumbosacral region: Secondary | ICD-10-CM | POA: Insufficient documentation

## 2019-10-10 ENCOUNTER — Other Ambulatory Visit: Payer: Medicare HMO

## 2019-10-14 ENCOUNTER — Other Ambulatory Visit: Payer: Self-pay

## 2019-10-14 ENCOUNTER — Encounter
Admission: RE | Admit: 2019-10-14 | Discharge: 2019-10-14 | Disposition: A | Discharge status: Home / Self Care | Payer: Medicare HMO | Source: Ambulatory Visit | Attending: Neurosurgery | Admitting: Neurosurgery

## 2019-10-14 DIAGNOSIS — Z01812 Encounter for preprocedural laboratory examination: Secondary | ICD-10-CM | POA: Insufficient documentation

## 2019-10-14 HISTORY — DX: Other intervertebral disc degeneration, lumbosacral region: M51.37

## 2019-10-14 HISTORY — DX: Other intervertebral disc degeneration, lumbosacral region without mention of lumbar back pain or lower extremity pain: M51.379

## 2019-10-14 HISTORY — DX: Other chronic pain: G89.29

## 2019-10-14 LAB — BASIC METABOLIC PANEL
Anion gap: 9 (ref 5–15)
BUN: 16 mg/dL (ref 8–23)
CO2: 30 mmol/L (ref 22–32)
Calcium: 8.7 mg/dL — ABNORMAL LOW (ref 8.9–10.3)
Chloride: 105 mmol/L (ref 98–111)
Creatinine, Ser: 0.92 mg/dL (ref 0.61–1.24)
GFR calc Af Amer: >60 mL/min (ref >60)
GFR calc non Af Amer: >60 mL/min (ref >60)
Glucose, Bld: 84 mg/dL (ref 70–99)
Potassium: 4 mmol/L (ref 3.5–5.1)
Sodium: 144 mmol/L (ref 135–145)

## 2019-10-14 LAB — URINALYSIS, ROUTINE W REFLEX MICROSCOPIC
Bilirubin Urine: NEGATIVE
Glucose, UA: NEGATIVE mg/dL
Hgb urine dipstick: NEGATIVE
Ketones, ur: NEGATIVE mg/dL
Leukocytes,Ua: NEGATIVE
Nitrite: NEGATIVE
Protein, ur: NEGATIVE mg/dL
Specific Gravity, Urine: 1.015 (ref 1.005–1.030)
pH: 5 (ref 5.0–8.0)

## 2019-10-14 LAB — CBC
HCT: 44.2 % (ref 39.0–52.0)
Hemoglobin: 14.9 g/dL (ref 13.0–17.0)
MCH: 30.7 pg (ref 26.0–34.0)
MCHC: 33.7 g/dL (ref 30.0–36.0)
MCV: 90.9 fL (ref 80.0–100.0)
Platelets: 172 10*3/uL (ref 150–400)
RBC: 4.86 MIL/uL (ref 4.22–5.81)
RDW: 13.3 % (ref 11.5–15.5)
WBC: 6.9 10*3/uL (ref 4.0–10.5)
nRBC: 0 % (ref 0.0–0.2)

## 2019-10-14 LAB — PROTIME-INR
INR: 1 (ref 0.8–1.2)
Prothrombin Time: 12.7 seconds (ref 11.4–15.2)

## 2019-10-14 LAB — TYPE AND SCREEN
ABO/RH(D): A POS
Antibody Screen: NEGATIVE

## 2019-10-14 LAB — APTT: aPTT: 32 s (ref 24–36)

## 2019-10-14 LAB — SURGICAL PCR SCREEN
MRSA, PCR: NEGATIVE
Staphylococcus aureus: NEGATIVE

## 2019-10-14 NOTE — Patient Instructions (Signed)
Your procedure is scheduled on: Mon 8/30 Report to Day Surgery. To find out your arrival time please call [phone] between 1PM - 3PM on Friday 8/27.  Remember: Instructions that are not followed completely may result in serious medical risk,  up to and including death, or upon the discretion of your surgeon and anesthesiologist your  surgery may need to be rescheduled.     _X__ 1. Do not eat food after midnight the night before your procedure.                 No chewing gum or hard candies. You may drink clear liquids up to 2 hours                 before you are scheduled to arrive for your surgery- DO not drink clear                 liquids within 2 hours of the start of your surgery.                 Clear Liquids include:  water, apple juice without pulp, clear Gatorade, G2 or                  Gatorade Zero (avoid Red/Purple/Blue), Black Coffee or Tea (Do not add                 anything to coffee or tea). _____2.   Complete the "Ensure Clear Pre-surgery Clear Carbohydrate Drink" provided to you, 2 hours before arrival. **If you       are diabetic you will be provided with an alternative drink, Gatorade Zero or G2.  __X__2.  On the morning of surgery brush your teeth with toothpaste and water, you                may rinse your mouth with mouthwash if you wish.  Do not swallow any toothpaste of mouthwash.     ___ 3.  No Alcohol for 24 hours before or after surgery.   __ 4.  Do Not Smoke or use e-cigarettes For 24 Hours Prior to Your Surgery.                 Do not use any chewable tobacco products for at least 6 hours prior to                 Surgery.  ___  5.  Do not use any recreational drugs (marijuana, cocaine, heroin, ecstasy, MDMA or other)                For at least one week prior to your surgery.  Combination of these drugs with anesthesia                May have life threatening results.  ____  6.  Bring all medications with you on the day of  surgery if instructed.   __x__  7.  Notify your doctor if there is any change in your medical condition      (cold, fever, infections).     Do not wear jewelry, Do not wear lotions,  You may wear deodorant. Do not shave 48 hours prior to surgery. Men may shave face and neck. Do not bring valuables to the hospital.    [facility] is not responsible for any belongings or valuables.  Contacts, dentures or bridgework may not be worn into surgery. Leave your suitcase in the car. After surgery it may  be brought to your room. For patients admitted to the hospital, discharge time is determined by your treatment team.   Patients discharged the day of surgery will not be allowed to drive home.   Make arrangements for someone to be with you for the first 24 hours of your Same Day Discharge.    Please read over the following fact sheets that you were given:     _x___ Take these medicines the morning of surgery with A SIP OF WATER:    1. acetaminophen (TYLENOL) 500 MG tablet  2. amiodarone (PACERONE) 200 MG tablet  3. atenolol (TENORMIN) 25 MG tablet  4.gabapentin (NEURONTIN) 300 MG capsule  5.levothyroxine (SYNTHROID, LEVOTHROID) 75 MCG tablet  6.  ____ Fleet Enema (as directed)   __x__ Use CHG Soap (or wipes) as directed  ____ Use Benzoyl Peroxide Gel as instructed  ____ Use inhalers on the day of surgery  ____ Stop metformin 2 days prior to surgery    ____ Take 1/2 of usual insulin dose the night before surgery. No insulin the morning          of surgery.   __x__ Stop aspirin on today  ___x_ Stop Anti-inflammatories    May take tylenol    ____ Stop supplements until after surgery.    ____ Bring C-Pap to the hospital.    If you have any questions regarding your pre-procedure instructions,  Please call Pre-admit Testing at [place]

## 2019-10-16 ENCOUNTER — Ambulatory Visit: Payer: Medicare HMO | Admitting: Surgery

## 2019-10-16 ENCOUNTER — Encounter: Payer: Self-pay | Admitting: Surgery

## 2019-10-16 ENCOUNTER — Ambulatory Visit
Admission: RE | Admit: 2019-10-16 | Discharge: 2019-10-16 | Disposition: A | Discharge status: Home / Self Care | Payer: Medicare HMO | Source: Ambulatory Visit | Attending: Surgery | Admitting: Surgery

## 2019-10-16 ENCOUNTER — Other Ambulatory Visit: Payer: Self-pay

## 2019-10-16 VITALS — BP 107/68 | HR 68 | Temp 98.4°F | Resp 16 | Ht 69.0 in | Wt 185.0 lb

## 2019-10-16 DIAGNOSIS — I7121 Aneurysm of the ascending aorta, without rupture: Secondary | ICD-10-CM

## 2019-10-16 DIAGNOSIS — I712 Thoracic aortic aneurysm, without rupture, unspecified: Secondary | ICD-10-CM

## 2019-10-16 MED ORDER — IOPAMIDOL (ISOVUE-370) INJECTION 76%
75.0000 mL | Freq: Once | INTRAVENOUS | Status: AC | PRN
  Administered 2019-10-16: 75 mL via INTRAVENOUS

## 2019-10-16 NOTE — Progress Notes (Signed)
HPI:  The patient is a 69 year old gentleman who returns for follow-up of a 4.5 cm fusiform ascending aortic aneurysm.  He denies any chest pain or shortness of breath.  He was supposed to have spine surgery next week at [facility] but said that that was being postponed due to the Covid surge.  Current Outpatient Medications  Medication Sig Dispense Refill  . acetaminophen (TYLENOL) 500 MG tablet Take 500 mg by mouth every 6 (six) hours as needed for moderate pain or headache.    Marland Kitchen amiodarone (PACERONE) 200 MG tablet Take 1 tablet (200 mg total) by mouth daily. 90 tablet 3  . aspirin EC 81 MG tablet Take 81 mg by mouth daily. Swallow whole.    Marland Kitchen atenolol (TENORMIN) 25 MG tablet Take 1 tablet (25 mg total) by mouth daily. Patient does not need right now, but will let you know when he does. Thank you! 90 tablet 3  . Carboxymethylcellulose Sodium (ARTIFICIAL TEARS OP) Place 1 drop into both eyes daily.    Marland Kitchen gabapentin (NEURONTIN) 300 MG capsule Take 300-600 mg by mouth See admin instructions. Take 300 mg in the morning and 600 mg at bedtime  0  . latanoprost (XALATAN) 0.005 % ophthalmic solution Place 1 drop into both eyes at bedtime.     Marland Kitchen levothyroxine (SYNTHROID, LEVOTHROID) 75 MCG tablet Take 75 mcg by mouth See admin instructions. Take 75 mcg daily Monday through Saturday, skip Sunday dose    . SUMAtriptan (IMITREX) 100 MG tablet Take 100 mg by mouth every 2 (two) hours as needed for migraine. May repeat in 2 hours if headache persists or recurs.     No current facility-administered medications for this visit.     Physical Exam: BP 107/68 (BP Location: Right Arm, Patient Position: Sitting, Cuff Size: Normal)   Pulse 68   Temp 98.4 F (36.9 C)   Resp 16   Ht 5\' 9"  (1.753 m)   Wt 185 lb (83.9 kg)   SpO2 96% Comment: RA  BMI 27.32 kg/m  He looks well. Cardiac exam shows a regular rate and rhythm with normal heart sounds. Lung exam is clear.  Diagnostic  Tests:  Narrative & Impression  CLINICAL DATA:  Thoracic aortic aneurysm.  Surveillance  EXAM: CT ANGIOGRAPHY CHEST WITH CONTRAST  TECHNIQUE: Multidetector CT imaging of the chest was performed using the standard protocol during bolus administration of intravenous contrast. Multiplanar CT image reconstructions and MIPs were obtained to evaluate the vascular anatomy.  CONTRAST:  36mL ISOVUE-370 IOPAMIDOL (ISOVUE-370) INJECTION 76%  COMPARISON:  09/05/2018  FINDINGS: Cardiovascular: Stable heart size. Ascending thoracic aortic diameter stable at 4.5 cm. Arch vessels remain widely patent.  Mediastinum/Nodes: No mediastinal lymphadenopathy. There is no hilar lymphadenopathy. The esophagus has normal imaging features. There is no axillary lymphadenopathy.  Lungs/Pleura: Centrilobular and paraseptal emphysema evident. 2 mm medial right upper lobe nodule on 58/6 is unchanged. 4 mm anterior left upper lobe nodule on 47/6 is stable. Left lower lobe atelectasis again noted. No focal airspace consolidation. No pleural effusion.  Upper Abdomen: 1.9 cm lesion in the subcapsular aspect of the inferior right liver (157/4) was present on a CT abdomen from 09/16/2009 although it measured only 8 mm at that time. 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 1.9 cm hypoattenuating lesion is noted at this location on the study from 1 year ago although no peripheral enhancement  was observed at that time, likely due to bolus timing small cyst noted in the dome of liver with other scattered small hypodensities in the liver parenchyma likely benign.  Musculoskeletal: No worrisome lytic or sclerotic osseous abnormality.  Review of the MIP images confirms the above findings.  IMPRESSION: 1. Stable ascending thoracic aortic diameter at 4.5 cm. 2.  Stable tiny bilateral pulmonary nodules. Given size and the 1 year stability, these are compatible with benign etiology. 3. Emphysema (ICD10-J43.9).   Electronically Signed   By: Misty Stanley M.D.   On: 10/16/2019 12:50      Impression:  This 70 year old gentleman has a stable 4.5 cm fusiform ascending aortic aneurysm.  This is well below the surgical threshold of 5.5 cm.  Previous echocardiogram has shown a normal trileaflet aortic valve.  I reviewed the CT images with the patient and answered his questions.  I stressed the importance of continued good blood pressure control and preventing further enlargement and acute aortic dissection.  I recommended a follow-up scan in 1 year to establish stability.  Plan:  He will return to see me in 1 year with a CTA of the chest.  I spent 20 minutes performing this established patient evaluation and > 50% of this time was spent face to face counseling and coordinating the care of this patient's aortic aneurysm.    Gaye Pollack, MD [facility] [phone]

## 2019-10-17 ENCOUNTER — Other Ambulatory Visit: Admission: RE | Admit: 2019-10-17 | Payer: Medicare HMO | Source: Ambulatory Visit

## 2019-10-21 ENCOUNTER — Encounter: Admission: RE | Payer: Self-pay | Source: Home / Self Care

## 2019-10-21 ENCOUNTER — Inpatient Hospital Stay: Admission: RE | Admit: 2019-10-21 | Payer: Medicare HMO | Source: Home / Self Care | Admitting: Neurosurgery

## 2019-10-21 SURGERY — MAXIMUM ACCESS (MAS) TRANSFORAMINAL LUMBAR INTERBODY FUSION (TLIF) 1 LEVEL
Anesthesia: General

## 2019-11-10 NOTE — Progress Notes (Signed)
11/11/2019 1:21 PM   Mauro Kaufmann 08-19-1950 [id number]  Referring provider: [name], MD [address] [facility] [place],  [place] [id number] Chief Complaint  Patient presents with  . Nephrolithiasis   Urologic history: 1.  Nephrolithiasis             -Bilateral ureteroscopic stone removal by Dr. Gloriann Loan 07/2017             -Small, nonobstructing left lower pole calculi  HPI: Bobby Flowers is a 69 y.o. male who returns for a 1 year follow up of nephrolithiasis.   -Since last visit, he reports doing well.  -recently underwent back surgery.  -Denies flank, abdominal or pelvic pain. -No recent stone episodes.  -No hematuria or dysuria.  PMH: Past Medical History:  Diagnosis Date  . Chest tightness    a. 05/2017 Cath: nl cors.  . Chronic pain of left lower extremity    right  . DDD (degenerative disc disease), lumbosacral   . Dilated aortic root ([place])    a. 06/2017 Echo: Ao root 4.4cm.  Marland Kitchen Dysrhythmia    afib  . Headache    migraines in past. none for 6-8 yrs.  . History of kidney stones   . Hypothyroidism   . Mitral valve prolapse    a. 09/2015 Echo: EF 55-60%, no rwma, nl LA size, nl RV fxn, nl PASP; b. 06/2017 Echo: EF 55-60%, no rwma, Gr2 DD. Asc Ao 4.4cm. Mild MR (? mild prolapse). Mildly dil LA.  Marland Kitchen PAF (paroxysmal atrial fibrillation) ([place])    a. Dx in 1980's; b. CHA2DS2VASc = 1 - briefly on coumadin many years ago-->caused rash; c. Chronic Amio 200mg  QD.  Marland Kitchen Wears contact lenses     Surgical History: Past Surgical History:  Procedure Laterality Date  . CARDIAC CATHETERIZATION    . COLONOSCOPY  04/25/05  . COLONOSCOPY WITH PROPOFOL N/A 07/22/2015   Procedure: COLONOSCOPY WITH PROPOFOL;  Surgeon: Robert Bellow, MD;  Location: [facility] ENDOSCOPY;  Service: Endoscopy;  Laterality: N/A;  . CYSTOSCOPY WITH HOLMIUM LASER LITHOTRIPSY Bilateral 07/28/2017   Procedure: CYSTOSCOPY WITH HOLMIUM LASER LITHOTRIPSY;  Surgeon: Lucas Mallow, MD;  Location: [facility] ORS;   Service: Urology;  Laterality: Bilateral;  . CYSTOSCOPY WITH STENT PLACEMENT Bilateral 07/28/2017   Procedure: CYSTOSCOPY WITH STENT PLACEMENT;  Surgeon: Lucas Mallow, MD;  Location: [facility] ORS;  Service: Urology;  Laterality: Bilateral;  . CYSTOSCOPY WITH URETEROSCOPY Bilateral 07/28/2017   Procedure: CYSTOSCOPY WITH URETEROSCOPY;  Surgeon: Lucas Mallow, MD;  Location: [facility] ORS;  Service: Urology;  Laterality: Bilateral;  . EYE SURGERY Left    cataract surgery  . HEMORROIDECTOMY    . HERNIA REPAIR Left May 22, 2015   Left inguinal hernia repair with medium Ultra Pro mesh  . INGUINAL HERNIA REPAIR Left 07/22/2015   Procedure: HERNIA REPAIR INGUINAL ADULT;  Surgeon: Robert Bellow, MD;  Location: [facility] ORS;  Service: General;  Laterality: Left;  . LEFT HEART CATH AND CORONARY ANGIOGRAPHY Left 05/29/2017   Procedure: LEFT HEART CATH AND CORONARY ANGIOGRAPHY;  Surgeon: [name], MD;  Location: [place] CV LAB;  Service: Cardiovascular;  Laterality: Left;  . LUMBAR LAMINECTOMY/DECOMPRESSION MICRODISCECTOMY Right 07/01/2019   Procedure: OPEN L4 LAMINECTOMY, L3/4 DISCECTOMY ON RIGHT, L4/5 DISCECTOMY;  Surgeon: Deetta Perla, MD;  Location: [facility] ORS;  Service: Neurosurgery;  Laterality: Right;  . myringotomy     with tubes x 5.  tubes fall out frequently  .  MYRINGOTOMY WITH TUBE PLACEMENT Left 12/13/2018   Procedure: MYRINGOTOMY WITH T-TUBE PLACEMENT;  Surgeon: [name], MD;  Location: [place];  Service: ENT;  Laterality: Left;  needs to stay first case vaught / juengel doing case together  . NASAL SINUS SURGERY    . NASOPHARYNGOSCOPY EUSTATION TUBE BALLOON DILATION Left 12/13/2018   Procedure: NASOPHARYNGOSCOPY EUSTATION TUBE BALLOON DILATION WITH OUTFRACTURE OF TURBINATES;  Surgeon: [name], MD;  Location: [place];  Service: ENT;  Laterality: Left;  Marland Kitchen VASECTOMY      Home Medications:  Allergies as of 11/11/2019      Reactions    Adhesive [tape] Rash   Paper tape okay   Coumadin [warfarin] Rash      Medication List       Accurate as of November 11, 2019  1:21 PM. If you have any questions, ask your nurse or doctor.        acetaminophen 500 MG tablet Commonly known as: TYLENOL Take 500 mg by mouth every 6 (six) hours as needed for moderate pain or headache.   amiodarone 200 MG tablet Commonly known as: PACERONE Take 1 tablet (200 mg total) by mouth daily.   ARTIFICIAL TEARS OP Place 1 drop into both eyes daily.   aspirin EC 81 MG tablet Take 81 mg by mouth daily. Swallow whole.   atenolol 25 MG tablet Commonly known as: TENORMIN Take 1 tablet (25 mg total) by mouth daily. Patient does not need right now, but will let you know when he does. Thank you!   gabapentin 300 MG capsule Commonly known as: NEURONTIN Take 300-600 mg by mouth See admin instructions. Take 300 mg in the morning and 600 mg at bedtime   latanoprost 0.005 % ophthalmic solution Commonly known as: XALATAN Place 1 drop into both eyes at bedtime.   levothyroxine 75 MCG tablet Commonly known as: SYNTHROID Take 75 mcg by mouth See admin instructions. Take 75 mcg daily Monday through Saturday, skip Sunday dose   methocarbamol 500 MG tablet Commonly known as: ROBAXIN Take by mouth.   polyethylene glycol powder 17 GM/SCOOP powder Commonly known as: GLYCOLAX/MIRALAX Take by mouth.   senna-docusate 8.6-50 MG tablet Commonly known as: Senokot-S Take 2 tablets by mouth daily.   SUMAtriptan 100 MG tablet Commonly known as: IMITREX Take 100 mg by mouth every 2 (two) hours as needed for migraine. May repeat in 2 hours if headache persists or recurs.       Allergies:  Allergies  Allergen Reactions  . Adhesive [Tape] Rash    Paper tape okay  . Coumadin [Warfarin] Rash    Family History: Family History  Problem Relation Age of Onset  . Heart failure Mother   . Leukemia Father   . Alcoholism Brother     Social History:   reports that he quit smoking about 19 years ago. His smoking use included cigarettes. He has a 46.50 pack-year smoking history. He has never used smokeless tobacco. He reports that he does not drink alcohol and does not use drugs.   Physical Exam: BP 102/69   Pulse 68   Ht 5\' 9"  (1.753 m)   Wt 180 lb (81.6 kg)   BMI 26.58 kg/m   Constitutional:  Alert and oriented, No acute distress. HEENT: [place] AT, moist mucus membranes.  Trachea midline, no masses. Cardiovascular: No clubbing, cyanosis, or edema. Skin: No rashes, bruises or suspicious lesions. Neurologic: Grossly intact, no focal deficits, moving all 4 extremities. Psychiatric: Normal mood and  affect.   Assessment & Plan:    1. Nephrolithiasis  Asymptomatic.  No recent stone episodes.  KUB today, will call with results. If KUB remains stable will transition to KUB every 2 years and have patient follow up as needed.   [place] [address], [place], [place] [address] [phone]  I, Selena Batten, am acting as a scribe for Dr. Nicki Reaper C. Cannen Dupras,  I have reviewed the above documentation for accuracy and completeness, and I agree with the above.   Abbie Sons, MD

## 2019-11-11 ENCOUNTER — Ambulatory Visit: Payer: Medicare HMO | Admitting: Urology

## 2019-11-11 ENCOUNTER — Ambulatory Visit
Admission: RE | Admit: 2019-11-11 | Discharge: 2019-11-11 | Disposition: A | Discharge status: Home / Self Care | Payer: Medicare HMO | Source: Ambulatory Visit | Attending: Urology | Admitting: Urology

## 2019-11-11 ENCOUNTER — Ambulatory Visit
Admission: RE | Admit: 2019-11-11 | Discharge: 2019-11-11 | Disposition: A | Discharge status: Home / Self Care | Payer: Medicare HMO | Attending: Urology | Admitting: Urology

## 2019-11-11 ENCOUNTER — Other Ambulatory Visit: Payer: Self-pay

## 2019-11-11 ENCOUNTER — Encounter: Payer: Self-pay | Admitting: Urology

## 2019-11-11 VITALS — BP 102/69 | HR 68 | Ht 69.0 in | Wt 180.0 lb

## 2019-11-11 DIAGNOSIS — N200 Calculus of kidney: Secondary | ICD-10-CM

## 2019-11-14 ENCOUNTER — Telehealth: Payer: Self-pay | Admitting: *Deleted

## 2019-11-14 NOTE — Telephone Encounter (Signed)
-----   Message from Abbie Sons, MD sent at 11/13/2019  4:59 PM EDT ----- No obvious calculi identified on KUB

## 2020-05-27 ENCOUNTER — Telehealth: Payer: Self-pay

## 2020-05-27 NOTE — Telephone Encounter (Signed)
LVM for pt to call clinic for a referral to GI for his colonoscopy.

## 2020-08-13 ENCOUNTER — Other Ambulatory Visit: Payer: Self-pay | Admitting: General Surgery

## 2020-08-13 NOTE — Progress Notes (Signed)
Subjective:     Patient ID: Bobby Flowers is a 70 y.o. male.   HPI       This an established patient is here today for: office visit. The patient is here today for a colonoscopy discussion. Patient reports he has bowel movements several times per day. He denies any rectal bleeding or mucus.    The patient had episode of atrial fibrillation in 2019 that spontaneously corrected.  Presently taking aspirin.   Back surgery in 2021 with good results of right lower extremity weakness.         Chief Complaint  Patient presents with   Treatment Plan Discussion      Colonoscopy       BP 136/78   Pulse 65   Temp 36.9 C (98.4 F)   Ht 177.8 cm (5\' 10" )   Wt 83.9 kg (185 lb)   SpO2 99%   BMI 26.54 kg/m        Past Medical History:  Diagnosis Date   Arrhythmia afib   Atrial fibrillation (CMS-HCC) 1998   Chronic pain of left lower extremity     DDD (degenerative disc disease), lumbosacral     Dilated aortic root (CMS-HCC) 06/2017   Glaucoma (increased eye pressure)     Hemorrhoid     History of kidney stones     Hyperlipidemia     Migraine     MVP (mitral valve prolapse)     Thyroid disease      hypothyroidism   Vision abnormalities             Past Surgical History:  Procedure Laterality Date   BACK SURGERY        x2   cardiac cath and coronary angiography Left 05/29/2017   CATARACT EXTRACTION Left     COLONOSCOPY   04/25/2005    Hyperplastic Polyp: CBF 04/2015; Recall Ltr mailed 03/06/2015 (dw)   COLONOSCOPY   07/22/2015   CYSTOSCOPY W/ URETEROSCOPY   07/28/2017    stent placement, holmium laser lithotripsy   ear surgery Left     HEMORRHOIDECTOMY BY SIMPLE LIGATION   1996   INGUINAL HERNIA REPAIR Left 07/22/2015   INGUINAL HERNIA REPAIR Left 05/22/2015   INSERTION INTERBODY BIOMECHANICAL DEVICE W/ INSTRUMENTATION N/A 10/31/2019    Procedure: INSERTION INTERBODY BIOMECHANICAL DEVICE WITH INTEGRAL ANTERIOR INSTRUMENTATION, TO INTERVERTEBRAL [place] SPACE IN CONJUNCTION  INTERBODY ARTHRODESIS, EACH INTERSPACE;  Surgeon: Malen Gauze, MD;  Location: [place];  Service: Neurosurgery;  Laterality: N/A;   INSTRUMENTATION NON-SEGMENTAL POSTERIOR SPINE N/A 10/31/2019    Procedure: INSTRUMENTATION POSTERIOR SPINE 1/2 VERTEBRAL SEGMENTS W/O FIXATION ADDITIONAL FIFTH;  Surgeon: Malen Gauze, MD;  Location: [place];  Service: Neurosurgery;  Laterality: N/A;   INTRAOPERATIVE FLUOROSCOPY N/A 10/31/2019    Procedure: FLUOROSCOPY;  Surgeon: Malen Gauze, MD;  Location: [place];  Service: Neurosurgery;  Laterality: N/A;   Right L3/4 Hemilaminectomy, Lateral recess decompression, Right L4/5 Hemilaminectomy and Discectomy   07/01/2019    Dr Deetta Perla at [facility]   TRANSFORAMINAL LUMBAR INTERBODY FUSION MINIMALLY INVASIVE N/A 10/31/2019    Procedure: L4-5 TLIF;  Surgeon: Malen Gauze, MD;  Location: [place];  Service: Neurosurgery;  Laterality: N/A;   VASECTOMY              Social History           Socioeconomic History   Marital status: Married      Spouse name: Ferd Hibbs  Tobacco Use   Smoking status: Former  Smoker      Packs/day: 1.50      Years: 31.00      Pack years: 52.50      Quit date: 03/24/2000      Years since quitting: 20.4   Smokeless tobacco: Never Used   Tobacco comment: He has a 32 pack year tobacco abuse history, quit 5-1/2 years ago.  Vaping Use   Vaping Use: Never used  Substance and Sexual Activity   Alcohol use: No      Alcohol/week: 0.0 standard drinks   Drug use: Never            Allergies  Allergen Reactions   Adhesive Tape-Silicones Rash   Warfarin Rash      Current Medications        Current Outpatient Medications  Medication Sig Dispense Refill   aspirin 81 MG EC tablet Take 81 mg by mouth once daily       atenolol (TENORMIN) 100 MG tablet Take 100 mg by mouth once daily Taking 1/2 tablet daily         latanoprost (XALATAN) 0.005 % ophthalmic solution INSTILL ONE DROP INTO BOTH EYES EVERY EVENING 7.5 mL  11   levothyroxine (SYNTHROID) 75 MCG tablet Take 1 tablet (75 mcg total) by mouth once daily Take on an empty stomach with a glass of water at least 30-60 minutes before breakfast. 90 tablet 3   PACERONE 200 mg tablet TAKE 1/2 TABLET BY MOUTH ONCE A DAY 15 tablet 0    No current facility-administered medications for this visit.             Family History  Problem Relation Age of Onset   Heart failure Mother          Congestive heart failure   Cancer Father          leukemia   Alcohol abuse Brother     Cancer Maternal Grandfather     Cancer Paternal Grandmother     Alcohol abuse Other          ALCOHOLISM   Diabetes Neg Hx     Glaucoma Neg Hx     High blood pressure (Hypertension) Neg Hx     Macular degeneration Neg Hx     Blindness Neg Hx          Review of Systems  Constitutional: Negative for chills and fever.  Respiratory: Negative for cough.          Objective:   Physical Exam Constitutional:      Appearance: Normal appearance.  Cardiovascular:     Rate and Rhythm: Normal rate and regular rhythm.     Pulses: Normal pulses.     Heart sounds: Normal heart sounds.  Pulmonary:     Effort: Pulmonary effort is normal.     Breath sounds: Normal breath sounds.  Neurological:     Mental Status: He is alert and oriented to person, place, and time.  Psychiatric:        Mood and Affect: Mood normal.        Behavior: Behavior normal.        Labs and Radiology:    Jul 22, 2015 colonoscopy results:   DIAGNOSIS:  A. COLON POLYP, PROXIMAL TRANSVERSE; COLD BIOPSY:  - TUBULAR ADENOMA.  - NEGATIVE FOR HIGH-GRADE DYSPLASIA AND MALIGNANCY.    GROSS DESCRIPTION:  A. Labeled: C BX polyp proximal transverse colon  Tissue fragment(s): 1  Size: 0.3 cm  Assessment:     History adenomatous polyps of the colon.    Plan:     Reviewed with the patient the 2017 recommendation for 5-year follow-ups.  2020 recommendations are 7-9 or 5 years as per patient  preference.  We have elected to go ahead and get this done hopefully with no polyps he can be stretched next exam out in the 8 to 10-year range.   He was instructed in regards to preparation by the staff.  We will arrange for this at a convenient date.  No medication changes.    This note is partially prepared by Ledell Noss, CMA acting as a scribe in the presence of Dr. Hervey Ard, MD.    The documentation recorded by the scribe accurately reflects the service I personally performed and the decisions made by me.    Robert Bellow, MD FACS

## 2020-08-28 ENCOUNTER — Other Ambulatory Visit: Payer: Self-pay | Admitting: Surgery

## 2020-08-28 DIAGNOSIS — I712 Thoracic aortic aneurysm, without rupture, unspecified: Secondary | ICD-10-CM

## 2020-09-01 ENCOUNTER — Other Ambulatory Visit: Payer: Self-pay

## 2020-09-01 ENCOUNTER — Encounter: Payer: Self-pay | Admitting: Cardiovascular Disease

## 2020-09-01 ENCOUNTER — Ambulatory Visit: Payer: Medicare HMO | Admitting: Cardiovascular Disease

## 2020-09-01 VITALS — BP 120/78 | HR 56 | Ht 69.0 in | Wt 184.0 lb

## 2020-09-01 DIAGNOSIS — I4819 Other persistent atrial fibrillation: Secondary | ICD-10-CM

## 2020-09-01 DIAGNOSIS — I712 Thoracic aortic aneurysm, without rupture: Secondary | ICD-10-CM

## 2020-09-01 DIAGNOSIS — R0600 Dyspnea, unspecified: Secondary | ICD-10-CM | POA: Diagnosis not present

## 2020-09-01 DIAGNOSIS — I7121 Aneurysm of the ascending aorta, without rupture: Secondary | ICD-10-CM

## 2020-09-01 DIAGNOSIS — I341 Nonrheumatic mitral (valve) prolapse: Secondary | ICD-10-CM

## 2020-09-01 NOTE — Progress Notes (Signed)
Cardiology Office Note   Date:  09/01/2020   ID:  Bobby Flowers, DOB [birth year], MRN [id number]  PCP:  [name], MD  Cardiologist:   [name], MD   Chief Complaint  Patient presents with   Other    12 month f/u c/o sob with little to no exertion. Meds reviewed verbally with pt.      History of Present Illness: Bobby Flowers is a 69 y.o. male who presents for a follow-up visit regarding persistent atrial fibrillation.   He has known history of persistent atrial fibrillation and mitral valve prolapse with mild regurgitation. He has prolonged history of atrial fibrillation which was diagnosed in the 55s.  He was briefly on warfarin but had an allergic reaction with a rash. He has not been on any anticoagulant since then given his low CHADS2 VASc score.  He quit smoking in 2002 and does not consume alcohol. He was seen by Dr. Rayann Heman in 2015 for consultation regarding possible catheter ablation for atrial fibrillation. Given stability on low-dose amiodarone, which was best decided to continue with medical therapy. He had recurrent atrial fibrillation in 2016 but he converted back to sinus rhythm after increasing the dose of amiodarone.   He had left heart catheterization in 2019 which showed normal coronary arteries with very unusual horizontal orientation of the heart. Echocardiogram 2019 showed normal LV systolic function with moderately dilated ascending aorta at 4.4 cm.  His atrial fibrillation has been well controlled with amiodarone 200 mg once daily. He had back surgery twice last year and feels that he became deconditioned after that.  He now reports dyspnea with minimal exertion with dry cough.  He denies chest pain.  No significant palpitation.   Past Medical History:  Diagnosis Date   Chest tightness    a. 05/2017 Cath: nl cors.   Chronic pain of left lower extremity    right   DDD (degenerative disc disease), lumbosacral    Dilated aortic root ([place])    a.  06/2017 Echo: Ao root 4.4cm.   Dysrhythmia    afib   Headache    migraines in past. none for 6-8 yrs.   History of kidney stones    Hypothyroidism    Mitral valve prolapse    a. 09/2015 Echo: EF 55-60%, no rwma, nl LA size, nl RV fxn, nl PASP; b. 06/2017 Echo: EF 55-60%, no rwma, Gr2 DD. Asc Ao 4.4cm. Mild MR (? mild prolapse). Mildly dil LA.   PAF (paroxysmal atrial fibrillation) ([place])    a. Dx in 1980's; b. CHA2DS2VASc = 1 - briefly on coumadin many years ago-->caused rash; c. Chronic Amio 200mg  QD.   Wears contact lenses     Past Surgical History:  Procedure Laterality Date   CARDIAC CATHETERIZATION     COLONOSCOPY  04/25/2005   COLONOSCOPY WITH PROPOFOL N/A 07/22/2015   Procedure: COLONOSCOPY WITH PROPOFOL;  Surgeon: Robert Bellow, MD;  Location: [facility] ENDOSCOPY;  Service: Endoscopy;  Laterality: N/A;   CYSTOSCOPY WITH HOLMIUM LASER LITHOTRIPSY Bilateral 07/28/2017   Procedure: CYSTOSCOPY WITH HOLMIUM LASER LITHOTRIPSY;  Surgeon: Lucas Mallow, MD;  Location: [facility];  Service: Urology;  Laterality: Bilateral;   CYSTOSCOPY WITH STENT PLACEMENT Bilateral 07/28/2017   Procedure: CYSTOSCOPY WITH STENT PLACEMENT;  Surgeon: Lucas Mallow, MD;  Location: [facility];  Service: Urology;  Laterality: Bilateral;   CYSTOSCOPY WITH URETEROSCOPY Bilateral 07/28/2017   Procedure: CYSTOSCOPY WITH URETEROSCOPY;  Surgeon: Lucas Mallow, MD;  Location: [facility];  Service: Urology;  Laterality: Bilateral;   EYE SURGERY Left    cataract surgery   HEMORROIDECTOMY     HERNIA REPAIR Left 05/22/2015   Left inguinal hernia repair with medium Ultra Pro mesh   INGUINAL HERNIA REPAIR Left 07/22/2015   Procedure: HERNIA REPAIR INGUINAL ADULT;  Surgeon: Robert Bellow, MD;  Location: [facility];  Service: General;  Laterality: Left;   LEFT HEART CATH AND CORONARY ANGIOGRAPHY Left 05/29/2017   Procedure: LEFT HEART CATH AND CORONARY ANGIOGRAPHY;  Surgeon: [name], MD;  Location: [place] CV LAB;  Service: Cardiovascular;  Laterality: Left;   LUMBAR FUSION N/A    LUMBAR LAMINECTOMY/DECOMPRESSION MICRODISCECTOMY Right 07/01/2019   Procedure: OPEN L4 LAMINECTOMY, L3/4 DISCECTOMY ON RIGHT, L4/5 DISCECTOMY;  Surgeon: Deetta Perla, MD;  Location: [facility];  Service: Neurosurgery;  Laterality: Right;   myringotomy     with tubes x 5.  tubes fall out frequently   MYRINGOTOMY WITH TUBE PLACEMENT Left 12/13/2018   Procedure: MYRINGOTOMY WITH T-TUBE PLACEMENT;  Surgeon: [name], MD;  Location: [place];  Service: ENT;  Laterality: Left;  needs to stay first case vaught / juengel doing case together   NASAL SINUS SURGERY     NASOPHARYNGOSCOPY [place] Left 12/13/2018   Procedure: NASOPHARYNGOSCOPY EUSTATION TUBE BALLOON DILATION WITH OUTFRACTURE OF TURBINATES;  Surgeon: [name], MD;  Location: [place];  Service: ENT;  Laterality: Left;   SPINE SURGERY N/A    VASECTOMY       Current Outpatient Medications  Medication Sig Dispense Refill   acetaminophen (TYLENOL) 500 MG tablet Take 500 mg by mouth every 6 (six) hours as needed for moderate pain or headache.     amiodarone (PACERONE) 200 MG tablet Take 1 tablet (200 mg total) by mouth daily. 90 tablet 3   aspirin EC 81 MG tablet Take 81 mg by mouth daily. Swallow whole.     atenolol (TENORMIN) 25 MG tablet Take 1 tablet (25 mg total) by mouth daily. Patient does not need right now, but will let you know when he does. Thank you! 90 tablet 3   Carboxymethylcellulose Sodium (ARTIFICIAL TEARS OP) Place 1 drop into both eyes daily.     gabapentin (NEURONTIN) 300 MG capsule Take 300-600 mg by mouth See admin instructions. Take 300 mg in the morning and 600 mg at bedtime  0   latanoprost (XALATAN) 0.005 % ophthalmic solution Place 1 drop into both eyes at bedtime.      levothyroxine (SYNTHROID, LEVOTHROID) 75 MCG tablet Take 75 mcg by mouth See admin instructions. Take 75  mcg daily Monday through Saturday, skip Sunday dose     SUMAtriptan (IMITREX) 100 MG tablet Take 100 mg by mouth every 2 (two) hours as needed for migraine. May repeat in 2 hours if headache persists or recurs.     polyethylene glycol powder (GLYCOLAX/MIRALAX) 17 GM/SCOOP powder Take by mouth. (Patient not taking: Reported on 09/01/2020)     senna-docusate (SENOKOT-S) 8.6-50 MG tablet Take 2 tablets by mouth daily. (Patient not taking: Reported on 09/01/2020)     No current facility-administered medications for this visit.    Allergies:   Adhesive [tape] and Coumadin [warfarin]    Social History:  The patient  reports that he quit smoking about 20 years ago. His smoking use included cigarettes. He has a 46.50 pack-year smoking history. He has never used smokeless tobacco. He reports that he does not drink alcohol  and does not use drugs.   Family History:  The patient's family history includes Alcoholism in his brother; Heart failure in his mother; Leukemia in his father.    ROS:  Please see the history of present illness.   Otherwise, review of systems are positive for none.   All other systems are reviewed and negative.    PHYSICAL EXAM: VS:  BP 120/78 (BP Location: Left Arm, Patient Position: Sitting, Cuff Size: Normal)   Pulse (!) 56   Ht 5\' 9"  (1.753 m)   Wt 184 lb (83.5 kg)   SpO2 98%   BMI 27.17 kg/m  , BMI Body mass index is 27.17 kg/m. GEN: Well nourished, well developed, in no acute distress  HEENT: normal  Neck: no JVD, carotid bruits, or masses Cardiac: RRR; rubs, or gallops,no edema .  1/ 6 systolic murmur at the base. Respiratory:  clear to auscultation bilaterally, normal work of breathing GI: soft, nontender, nondistended, + BS MS: no deformity or atrophy  Skin: warm and dry, no rash Neuro:  Strength and sensation are intact Psych: euthymic mood, full affect Right radial pulses normal with no hematoma.  EKG:  EKG is ordered today. The ekg ordered today  demonstrates sinus bradycardia with first-degree AV block.  Possible old anteroseptal infarct but this pattern is likely due to abnormal orientation of the heart.   Recent Labs: 10/14/2019: BUN 16; Creatinine, Ser 0.92; Hemoglobin 14.9; Platelets 172; Potassium 4.0; Sodium 144    Lipid Panel No results found for: CHOL, TRIG, HDL, CHOLHDL, VLDL, LDLCALC, LDLDIRECT    Wt Readings from Last 3 Encounters:  09/01/20 184 lb (83.5 kg)  11/11/19 180 lb (81.6 kg)  10/16/19 185 lb (83.9 kg)         ASSESSMENT AND PLAN:  1.  Dyspnea with minimal exertion: This is a new complaint.  He quit smoking in 2002.  He has been on amiodarone for a long time.  I requested an echocardiogram and I am also referring him to pulmonary for evaluation.  He will need full pulmonary function test with diffusion capacity.  2.  Persistent atrial fibrillation: Well-controlled with amiodarone 200 mg once daily.  However, I am concerned that he has been on amiodarone for a long time with possible long-term toxicities.  We will obtain an echocardiogram and refer to Dr. Quentin Ore for an opinion regarding the options of management including ablation versus continued on amiodarone or a different antiarrhythmic medication. CHA2DS2-VASc score is still 1 and the patient is currently not on anticoagulation.  3.  Mitral valve disease with mitral valve prolapse: Most recent echocardiogram showed mild regurgitation.  Recheck with echocardiogram.  4.  Ascending aortic aneurysm at 4.5 cm: The patient is established with Dr. Cyndia Bent.    Disposition:   FU with me in 6 months  Signed,  [name], MD  09/01/2020 4:44 PM    [facility]

## 2020-09-01 NOTE — Patient Instructions (Signed)
Medication Instructions:  Your physician recommends that you continue on your current medications as directed. Please refer to the Current Medication list given to you today.  *If you need a refill on your cardiac medications before your next appointment, please call your pharmacy*   Lab Work: None ordered If you have labs (blood work) drawn today and your tests are completely normal, you will receive your results only by: [place] (if you have MyChart) OR A paper copy in the mail If you have any lab test that is abnormal or we need to change your treatment, we will call you to review the results.   Testing/Procedures: Your physician has requested that you have an echocardiogram. Echocardiography is a painless test that uses sound waves to create images of your heart. It provides your doctor with information about the size and shape of your heart and how well your heart's chambers and valves are working. This procedure takes approximately one hour. There are no restrictions for this procedure.    Follow-Up: At [facility], you and your health needs are our priority.  As part of our continuing mission to provide you with exceptional heart care, we have created designated Provider Care Teams.  These Care Teams include your primary Cardiologist (physician) and Advanced Practice Providers (APPs -  Physician Assistants and Nurse Practitioners) who all work together to provide you with the care you need, when you need it.  We recommend signing up for the patient portal called "MyChart".  Sign up information is provided on this After Visit Summary.  MyChart is used to connect with patients for Virtual Visits (Telemedicine).  Patients are able to view lab/test results, encounter notes, upcoming appointments, etc.  Non-urgent messages can be sent to your provider as well.   To learn more about what you can do with MyChart, go to [email or web address].    Your next appointment:   Your  physician wants you to follow-up in: 6 months You will receive a reminder letter in the mail two months in advance. If you don't receive a letter, please call our office to schedule the follow-up appointment.   The format for your next appointment:   In Person  Provider:   You may see Bobby Sacramento, MD or one of the following Advanced Practice Providers on your designated Care Team:   Murray Hodgkins, NP Christell Faith, PA-C Marrianne Mood, PA-C Cadence Kathlen Mody, [place]   Other Instructions You have been referred to EP Dr. Hunt Oris have been referred to [facility]. Their office will call you to schedule the appt.

## 2020-09-02 NOTE — Addendum Note (Signed)
Addended by: Britt Bottom on: 09/02/2020 10:38 AM   Modules accepted: Orders

## 2020-09-03 ENCOUNTER — Encounter: Payer: Self-pay | Admitting: General Surgery

## 2020-09-04 ENCOUNTER — Encounter
Admission: RE | Disposition: A | Discharge status: Home / Self Care | Payer: Self-pay | Source: Home / Self Care | Attending: General Surgery

## 2020-09-04 ENCOUNTER — Other Ambulatory Visit: Payer: Self-pay

## 2020-09-04 ENCOUNTER — Encounter: Payer: Self-pay | Admitting: General Surgery

## 2020-09-04 ENCOUNTER — Ambulatory Visit: Payer: Medicare HMO | Admitting: Anesthesiology

## 2020-09-04 ENCOUNTER — Ambulatory Visit
Admission: RE | Admit: 2020-09-04 | Discharge: 2020-09-04 | Disposition: A | Discharge status: Home / Self Care | Payer: Medicare HMO | Attending: General Surgery | Admitting: General Surgery

## 2020-09-04 DIAGNOSIS — Z79899 Other long term (current) drug therapy: Secondary | ICD-10-CM | POA: Diagnosis not present

## 2020-09-04 DIAGNOSIS — Z87891 Personal history of nicotine dependence: Secondary | ICD-10-CM | POA: Insufficient documentation

## 2020-09-04 DIAGNOSIS — Z1211 Encounter for screening for malignant neoplasm of colon: Secondary | ICD-10-CM | POA: Diagnosis not present

## 2020-09-04 DIAGNOSIS — Z79890 Hormone replacement therapy (postmenopausal): Secondary | ICD-10-CM | POA: Insufficient documentation

## 2020-09-04 DIAGNOSIS — Z888 Allergy status to other drugs, medicaments and biological substances status: Secondary | ICD-10-CM | POA: Insufficient documentation

## 2020-09-04 DIAGNOSIS — Z86010 Personal history of colonic polyps: Secondary | ICD-10-CM | POA: Insufficient documentation

## 2020-09-04 DIAGNOSIS — Z7982 Long term (current) use of aspirin: Secondary | ICD-10-CM | POA: Insufficient documentation

## 2020-09-04 HISTORY — PX: COLONOSCOPY WITH PROPOFOL: SHX5780

## 2020-09-04 SURGERY — COLONOSCOPY WITH PROPOFOL
Anesthesia: General

## 2020-09-04 MED ORDER — GLYCOPYRROLATE 0.2 MG/ML IJ SOLN
INTRAMUSCULAR | Status: DC | PRN
  Administered 2020-09-04: .2 mg via INTRAVENOUS

## 2020-09-04 MED ORDER — PROPOFOL 500 MG/50ML IV EMUL
INTRAVENOUS | Status: DC | PRN
  Administered 2020-09-04: 150 ug/kg/min via INTRAVENOUS

## 2020-09-04 MED ORDER — SODIUM CHLORIDE 0.9 % IV SOLN: INTRAVENOUS | Status: DC

## 2020-09-04 MED ORDER — PROPOFOL 10 MG/ML IV BOLUS
INTRAVENOUS | Status: DC | PRN
  Administered 2020-09-04: 70 mg via INTRAVENOUS

## 2020-09-04 NOTE — Anesthesia Preprocedure Evaluation (Signed)
Anesthesia Evaluation  Patient identified by MRN, date of birth, ID band Patient awake    Reviewed: Allergy & Precautions, NPO status , Patient's Chart, lab work & pertinent test results  History of Anesthesia Complications Negative for: history of anesthetic complications  Airway Mallampati: III  TM Distance: <3 FB Neck ROM: limited    Dental  (+) Chipped, Poor Dentition, Missing   Pulmonary neg shortness of breath, former smoker,    Pulmonary exam normal        Cardiovascular Exercise Tolerance: Good (-) anginaNormal cardiovascular exam+ dysrhythmias      Neuro/Psych  Headaches,  Neuromuscular disease negative psych ROS   GI/Hepatic negative GI ROS, Neg liver ROS,   Endo/Other  Hypothyroidism   Renal/GU Renal disease  negative genitourinary   Musculoskeletal  (+) Arthritis ,   Abdominal   Peds  Hematology negative hematology ROS (+)   Anesthesia Other Findings Past Medical History: No date: Chest tightness     Comment:  a. 05/2017 Cath: nl cors. No date: Chronic pain of left lower extremity     Comment:  right No date: DDD (degenerative disc disease), lumbosacral No date: Dilated aortic root ([place])     Comment:  a. 06/2017 Echo: Ao root 4.4cm. No date: Dysrhythmia     Comment:  afib No date: Headache     Comment:  migraines in past. none for 6-8 yrs. No date: History of kidney stones No date: Hypothyroidism No date: Mitral valve prolapse     Comment:  a. 09/2015 Echo: EF 55-60%, no rwma, nl LA size, nl RV               fxn, nl PASP; b. 06/2017 Echo: EF 55-60%, no rwma, Gr2 DD.              Asc Ao 4.4cm. Mild MR (? mild prolapse). Mildly dil LA. No date: PAF (paroxysmal atrial fibrillation) (HCC)     Comment:  a. Dx in 1980's; b. CHA2DS2VASc = 1 - briefly on               coumadin many years ago-->caused rash; c. Chronic Amio               200mg  QD. No date: Wears contact lenses  Past Surgical  History: No date: CARDIAC CATHETERIZATION 04/25/2005: COLONOSCOPY 07/22/2015: COLONOSCOPY WITH PROPOFOL; N/A     Comment:  Procedure: COLONOSCOPY WITH PROPOFOL;  Surgeon: Robert Bellow, MD;  Location: [facility] ENDOSCOPY;  Service:               Endoscopy;  Laterality: N/A; 07/28/2017: CYSTOSCOPY WITH HOLMIUM LASER LITHOTRIPSY; Bilateral     Comment:  Procedure: CYSTOSCOPY WITH HOLMIUM LASER LITHOTRIPSY;                Surgeon: Lucas Mallow, MD;  Location: [facility] ORS;                Service: Urology;  Laterality: Bilateral; 07/28/2017: CYSTOSCOPY WITH STENT PLACEMENT; Bilateral     Comment:  Procedure: [place];  Surgeon:               Lucas Mallow, MD;  Location: [facility] ORS;  Service:               Urology;  Laterality: Bilateral; 07/28/2017: CYSTOSCOPY WITH URETEROSCOPY; Bilateral     Comment:  Procedure: CYSTOSCOPY WITH URETEROSCOPY;  Surgeon: Lucas Mallow, MD;  Location: [facility] ORS;  Service: Urology;              Laterality: Bilateral; No date: EYE SURGERY; Left     Comment:  cataract surgery No date: HEMORROIDECTOMY 05/22/2015: HERNIA REPAIR; Left     Comment:  Left inguinal hernia repair with medium Ultra Pro mesh 07/22/2015: INGUINAL HERNIA REPAIR; Left     Comment:  Procedure: HERNIA REPAIR INGUINAL ADULT;  Surgeon:               Robert Bellow, MD;  Location: [facility] ORS;  Service:               General;  Laterality: Left; 05/29/2017: LEFT HEART CATH AND CORONARY ANGIOGRAPHY; Left     Comment:  Procedure: LEFT HEART CATH AND CORONARY ANGIOGRAPHY;                Surgeon: [name], MD;  Location: [place]               CV LAB;  Service: Cardiovascular;  Laterality: Left; No date: LUMBAR FUSION; N/A 07/01/2019: LUMBAR LAMINECTOMY/DECOMPRESSION MICRODISCECTOMY; Right     Comment:  Procedure: OPEN L4 LAMINECTOMY, L3/4 DISCECTOMY ON               RIGHT, L4/5 DISCECTOMY;  Surgeon: Deetta Perla, MD;                 Location: [facility] ORS;  Service: Neurosurgery;  Laterality:               Right; No date: myringotomy     Comment:  with tubes x 5.  tubes fall out frequently 12/13/2018: MYRINGOTOMY WITH TUBE PLACEMENT; Left     Comment:  Procedure: MYRINGOTOMY WITH T-TUBE PLACEMENT;  Surgeon:               [name], MD;  Location: [place];                Service: ENT;  Laterality: Left;  needs to stay first               case vaught / juengel doing case together No date: NASAL SINUS SURGERY 12/13/2018: NASOPHARYNGOSCOPY EUSTATION TUBE BALLOON DILATION; Left     Comment:  Procedure: NASOPHARYNGOSCOPY EUSTATION TUBE BALLOON               DILATION WITH OUTFRACTURE OF TURBINATES;  Surgeon:               [name], MD;  Location: [place];                Service: ENT;  Laterality: Left; No date: SPINE SURGERY; N/A No date: VASECTOMY  BMI    Body Mass Index: 27.32 kg/m      Reproductive/Obstetrics negative OB ROS                             Anesthesia Physical Anesthesia Plan  ASA: 3  Anesthesia Plan: General   Post-op Pain Management:    Induction: Intravenous  PONV Risk Score and Plan: Propofol infusion and TIVA  Airway Management Planned: Natural Airway and Nasal Cannula  Additional Equipment:   Intra-op Plan:   Post-operative Plan:   Informed Consent: I have reviewed the patients History and Physical, chart, labs and discussed  the procedure including the risks, benefits and alternatives for the proposed anesthesia with the patient or authorized representative who has indicated his/her understanding and acceptance.     Dental Advisory Given  Plan Discussed with: Anesthesiologist, CRNA and Surgeon  Anesthesia Plan Comments: (Patient consented for risks of anesthesia including but not limited to:  - adverse reactions to medications - risk of airway placement if required - damage to eyes, teeth, lips or other  oral mucosa - nerve damage due to positioning  - sore throat or hoarseness - Damage to heart, brain, nerves, lungs, other parts of body or loss of life  Patient voiced understanding.)        Anesthesia Quick Evaluation

## 2020-09-04 NOTE — Op Note (Signed)
[facility] Patient Name: Bobby Flowers Procedure Date: 09/04/2020 10:23 AM MRN: [id number] Account #: [id number] Date of Birth: [birth year] Admit Type: Outpatient Age: 70 Room: [facility] ENDO ROOM 1 Gender: Male Note Status: Finalized Procedure:             Colonoscopy Indications:           High risk colon cancer surveillance: Personal history                         of colonic polyps Providers:             Robert Bellow, MD Medicines:             Propofol per Anesthesia Complications:         No immediate complications. Procedure:             Pre-Anesthesia Assessment:                        - Prior to the procedure, a History and Physical was                         performed, and patient medications, allergies and                         sensitivities were reviewed. The patient's tolerance                         of previous anesthesia was reviewed.                        - The risks and benefits of the procedure and the                         sedation options and risks were discussed with the                         patient. All questions were answered and informed                         consent was obtained.                        After obtaining informed consent, the colonoscope was                         passed under direct vision. Throughout the procedure,                         the patient's blood pressure, pulse, and oxygen                         saturations were monitored continuously. The                         Colonoscope was introduced through the anus and                         advanced to the the cecum, identified by appendiceal  orifice and ileocecal valve. The colonoscopy was                         somewhat difficult due to a tortuous colon. Successful                         completion of the procedure was aided by using manual                         pressure. The patient tolerated the procedure  well.                         The quality of the bowel preparation was good.                         Fibrinous sheets of vegetable matter needed to be                         cleared to visualize the appendiceal orifice and some                         of the right colon mucusa. Findings:      The entire examined colon appeared normal on direct and retroflexion       views. Impression:            - The entire examined colon is normal on direct and                         retroflexion views.                        - No specimens collected. Recommendation:        - Repeat colonoscopy in 7 years for surveillance. Procedure Code(s):     --- Professional ---                        [phone], Colonoscopy, flexible; diagnostic, including                         collection of specimen(s) by brushing or washing, when                         performed (separate procedure) Diagnosis Code(s):     --- Professional ---                        Z86.010, Personal history of colonic polyps CPT copyright 2019 American Medical Association. All rights reserved. The codes documented in this report are preliminary and upon coder review may  be revised to meet current compliance requirements. Robert Bellow, MD 09/04/2020 10:53:36 AM This report has been signed electronically. Number of Addenda: 0 Note Initiated On: 09/04/2020 10:23 AM Scope Withdrawal Time: 0 hours 14 minutes 51 seconds  Total Procedure Duration: 0 hours 19 minutes 54 seconds  Estimated Blood Loss:  Estimated blood loss: none.      [facility]

## 2020-09-04 NOTE — Transfer of Care (Signed)
Immediate Anesthesia Transfer of Care Note  Patient: Bobby Flowers  Procedure(s) Performed: COLONOSCOPY WITH PROPOFOL  Patient Location: PACU and Endoscopy Unit  Anesthesia Type:General  Level of Consciousness: drowsy  Airway & Oxygen Therapy: Patient Spontanous Breathing  Post-op Assessment: Report given to RN  Post vital signs: stable  Last Vitals:  Vitals Value Taken Time  BP    Temp    Pulse    Resp    SpO2      Last Pain:  Vitals:   09/04/20 1008  TempSrc: Temporal  PainSc: 0-No pain         Complications: No notable events documented.

## 2020-09-04 NOTE — H&P (Signed)
Bobby Flowers [id number] 11-22-1950     HPI: Healthy male with a single tubular adenoma of the transverse colon in 2017. For follow up exam. Tolerated the prep well.   Medications Prior to Admission  Medication Sig Dispense Refill Last Dose   amiodarone (PACERONE) 200 MG tablet Take 1 tablet (200 mg total) by mouth daily. 90 tablet 3 09/03/2020   aspirin EC 81 MG tablet Take 81 mg by mouth daily. Swallow whole.   09/03/2020   gabapentin (NEURONTIN) 300 MG capsule Take 300-600 mg by mouth See admin instructions. Take 300 mg in the morning and 600 mg at bedtime  0 09/03/2020   levothyroxine (SYNTHROID, LEVOTHROID) 75 MCG tablet Take 75 mcg by mouth See admin instructions. Take 75 mcg daily Monday through Saturday, skip Sunday dose   09/03/2020   SUMAtriptan (IMITREX) 100 MG tablet Take 100 mg by mouth every 2 (two) hours as needed for migraine. May repeat in 2 hours if headache persists or recurs.   Past Month   acetaminophen (TYLENOL) 500 MG tablet Take 500 mg by mouth every 6 (six) hours as needed for moderate pain or headache.      atenolol (TENORMIN) 25 MG tablet Take 1 tablet (25 mg total) by mouth daily. Patient does not need right now, but will let you know when he does. Thank you! 90 tablet 3    Carboxymethylcellulose Sodium (ARTIFICIAL TEARS OP) Place 1 drop into both eyes daily.      latanoprost (XALATAN) 0.005 % ophthalmic solution Place 1 drop into both eyes at bedtime.       polyethylene glycol powder (GLYCOLAX/MIRALAX) 17 GM/SCOOP powder Take by mouth. (Patient not taking: Reported on 09/01/2020)      senna-docusate (SENOKOT-S) 8.6-50 MG tablet Take 2 tablets by mouth daily. (Patient not taking: Reported on 09/01/2020)      Allergies  Allergen Reactions   Adhesive [Tape] Rash    Paper tape okay   Coumadin [Warfarin] Rash   Past Medical History:  Diagnosis Date   Chest tightness    a. 05/2017 Cath: nl cors.   Chronic pain of left lower extremity    right   DDD (degenerative disc  disease), lumbosacral    Dilated aortic root ([place])    a. 06/2017 Echo: Ao root 4.4cm.   Dysrhythmia    afib   Headache    migraines in past. none for 6-8 yrs.   History of kidney stones    Hypothyroidism    Mitral valve prolapse    a. 09/2015 Echo: EF 55-60%, no rwma, nl LA size, nl RV fxn, nl PASP; b. 06/2017 Echo: EF 55-60%, no rwma, Gr2 DD. Asc Ao 4.4cm. Mild MR (? mild prolapse). Mildly dil LA.   PAF (paroxysmal atrial fibrillation) ([place])    a. Dx in 1980's; b. CHA2DS2VASc = 1 - briefly on coumadin many years ago-->caused rash; c. Chronic Amio 200mg  QD.   Wears contact lenses    Past Surgical History:  Procedure Laterality Date   CARDIAC CATHETERIZATION     COLONOSCOPY  04/25/2005   COLONOSCOPY WITH PROPOFOL N/A 07/22/2015   Procedure: COLONOSCOPY WITH PROPOFOL;  Surgeon: Robert Bellow, MD;  Location: [facility] ENDOSCOPY;  Service: Endoscopy;  Laterality: N/A;   CYSTOSCOPY WITH HOLMIUM LASER LITHOTRIPSY Bilateral 07/28/2017   Procedure: CYSTOSCOPY WITH HOLMIUM LASER LITHOTRIPSY;  Surgeon: Lucas Mallow, MD;  Location: [facility] ORS;  Service: Urology;  Laterality: Bilateral;   CYSTOSCOPY WITH STENT PLACEMENT Bilateral 07/28/2017   Procedure: CYSTOSCOPY  WITH STENT PLACEMENT;  Surgeon: Lucas Mallow, MD;  Location: [facility] ORS;  Service: Urology;  Laterality: Bilateral;   CYSTOSCOPY WITH URETEROSCOPY Bilateral 07/28/2017   Procedure: CYSTOSCOPY WITH URETEROSCOPY;  Surgeon: Lucas Mallow, MD;  Location: [facility] ORS;  Service: Urology;  Laterality: Bilateral;   EYE SURGERY Left    cataract surgery   HEMORROIDECTOMY     HERNIA REPAIR Left 05/22/2015   Left inguinal hernia repair with medium Ultra Pro mesh   INGUINAL HERNIA REPAIR Left 07/22/2015   Procedure: HERNIA REPAIR INGUINAL ADULT;  Surgeon: Robert Bellow, MD;  Location: [facility] ORS;  Service: General;  Laterality: Left;   LEFT HEART CATH AND CORONARY ANGIOGRAPHY Left 05/29/2017   Procedure: LEFT HEART CATH AND CORONARY  ANGIOGRAPHY;  Surgeon: [name], MD;  Location: [place] CV LAB;  Service: Cardiovascular;  Laterality: Left;   LUMBAR FUSION N/A    LUMBAR LAMINECTOMY/DECOMPRESSION MICRODISCECTOMY Right 07/01/2019   Procedure: OPEN L4 LAMINECTOMY, L3/4 DISCECTOMY ON RIGHT, L4/5 DISCECTOMY;  Surgeon: Deetta Perla, MD;  Location: [facility] ORS;  Service: Neurosurgery;  Laterality: Right;   myringotomy     with tubes x 5.  tubes fall out frequently   MYRINGOTOMY WITH TUBE PLACEMENT Left 12/13/2018   Procedure: MYRINGOTOMY WITH T-TUBE PLACEMENT;  Surgeon: [name], MD;  Location: [place];  Service: ENT;  Laterality: Left;  needs to stay first case vaught / juengel doing case together   NASAL SINUS SURGERY     NASOPHARYNGOSCOPY EUSTATION TUBE BALLOON DILATION Left 12/13/2018   Procedure: [place] OUTFRACTURE OF TURBINATES;  Surgeon: [name], MD;  Location: [place];  Service: ENT;  Laterality: Left;   SPINE SURGERY N/A    VASECTOMY     Social History   Socioeconomic History   Marital status: Married    Spouse name: linda   Number of children: Not on file   Years of education: Not on file   Highest education level: Not on file  Occupational History   Occupation: customer service rep    Employer: [employer]  Tobacco Use   Smoking status: Former    Packs/day: 1.50    Years: 31.00    Pack years: 46.50    Types: Cigarettes    Quit date: 07/14/2000    Years since quitting: 20.1   Smokeless tobacco: Never  Vaping Use   Vaping Use: Never used  Substance and Sexual Activity   Alcohol use: No   Drug use: No   Sexual activity: Not Currently  Other Topics Concern   Not on file  Social History Narrative   Patient lives with wife.   Social Determinants of Health   Financial Resource Strain: Not on file  Food Insecurity: Not on file  Transportation Needs: Not on file  Physical Activity: Not on  file  Stress: Not on file  Social Connections: Not on file  Intimate Partner Violence: Not on file   Social History   Social History Narrative   Patient lives with wife.     ROS: Negative.     PE: HEENT: Negative. Lungs: Clear. Cardio: RR.   Assessment/Plan:  Proceed with planned endoscopy.  [facility] 09/04/2020

## 2020-09-04 NOTE — Anesthesia Postprocedure Evaluation (Signed)
Anesthesia Post Note  Patient: Bobby Flowers  Procedure(s) Performed: COLONOSCOPY WITH PROPOFOL  Patient location during evaluation: Endoscopy Anesthesia Type: General Level of consciousness: awake and alert Pain management: pain level controlled Vital Signs Assessment: post-procedure vital signs reviewed and stable Respiratory status: spontaneous breathing, nonlabored ventilation, respiratory function stable and patient connected to nasal cannula oxygen Cardiovascular status: blood pressure returned to baseline and stable Postop Assessment: no apparent nausea or vomiting Anesthetic complications: no   No notable events documented.   Last Vitals:  Vitals:   09/04/20 1110 09/04/20 1120  BP: 105/65 112/73  Pulse: (!) 46 (!) 52  Resp: 13 11  Temp:    SpO2: 100% 99%    Last Pain:  Vitals:   09/04/20 1050  TempSrc: Temporal  PainSc:                  Precious Haws Montie Gelardi

## 2020-09-07 ENCOUNTER — Encounter: Payer: Self-pay | Admitting: General Surgery

## 2020-10-07 ENCOUNTER — Other Ambulatory Visit: Payer: Self-pay | Admitting: Family

## 2020-10-14 ENCOUNTER — Ambulatory Visit
Admission: RE | Admit: 2020-10-14 | Discharge: 2020-10-14 | Disposition: A | Discharge status: Home / Self Care | Payer: Medicare HMO | Source: Ambulatory Visit | Attending: Surgery | Admitting: Surgery

## 2020-10-14 ENCOUNTER — Ambulatory Visit: Payer: Medicare HMO | Admitting: Surgery

## 2020-10-14 DIAGNOSIS — I712 Thoracic aortic aneurysm, without rupture, unspecified: Secondary | ICD-10-CM

## 2020-10-14 MED ORDER — IOPAMIDOL (ISOVUE-370) INJECTION 76%
75.0000 mL | Freq: Once | INTRAVENOUS | Status: AC | PRN
  Administered 2020-10-14: 75 mL via INTRAVENOUS

## 2020-10-15 ENCOUNTER — Ambulatory Visit: Payer: Medicare HMO | Admitting: Physician Assistant

## 2020-10-15 ENCOUNTER — Other Ambulatory Visit: Payer: Self-pay

## 2020-10-15 ENCOUNTER — Ambulatory Visit: Payer: Medicare HMO

## 2020-10-15 VITALS — BP 133/74 | HR 53 | Resp 18 | Ht 69.0 in | Wt 183.6 lb

## 2020-10-15 DIAGNOSIS — I712 Thoracic aortic aneurysm, without rupture, unspecified: Secondary | ICD-10-CM | POA: Insufficient documentation

## 2020-10-15 DIAGNOSIS — I7121 Aneurysm of the ascending aorta, without rupture: Secondary | ICD-10-CM

## 2020-10-15 NOTE — Progress Notes (Signed)
HPI:  The patient is a 70 year old gentleman who returns for follow-up of a 4.5 cm fusiform ascending aortic aneurysm.  He denies any chest pain or shortness of breath.  He has remained physically active and maintaining a healthy diet.   Current Outpatient Medications on File Prior to Visit  Medication Sig Dispense Refill   acetaminophen (TYLENOL) 500 MG tablet Take 500 mg by mouth every 6 (six) hours as needed for moderate pain or headache.     amiodarone (PACERONE) 200 MG tablet TAKE ONE TABLET BY MOUTH DAILY 90 tablet 1   aspirin EC 81 MG tablet Take 81 mg by mouth daily. Swallow whole.     Carboxymethylcellulose Sodium (ARTIFICIAL TEARS OP) Place 1 drop into both eyes daily.     gabapentin (NEURONTIN) 300 MG capsule Take 300-600 mg by mouth See admin instructions. Take 300 mg in the morning and 600 mg at bedtime  0   latanoprost (XALATAN) 0.005 % ophthalmic solution Place 1 drop into both eyes at bedtime.      levothyroxine (SYNTHROID, LEVOTHROID) 75 MCG tablet Take 75 mcg by mouth See admin instructions. Take 75 mcg daily Monday through Saturday, skip Sunday dose     polyethylene glycol powder (GLYCOLAX/MIRALAX) 17 GM/SCOOP powder Take by mouth.     senna-docusate (SENOKOT-S) 8.6-50 MG tablet Take 2 tablets by mouth daily.     SUMAtriptan (IMITREX) 100 MG tablet Take 100 mg by mouth every 2 (two) hours as needed for migraine. May repeat in 2 hours if headache persists or recurs.     atenolol (TENORMIN) 25 MG tablet Take 1 tablet (25 mg total) by mouth daily. Patient does not need right now, but will let you know when he does. Thank you! 90 tablet 3   No current facility-administered medications on file prior to visit.       Physical Exam: Vitals:   10/15/20 1049  BP: 133/74  Pulse: (!) 53  Resp: 18  SpO2: 95%    General: He looks well. CV: Cardiac exam shows a regular rate and rhythm with normal heart sounds. Pulm: Lung exam is clear. Ext: No lower ext  edema  Diagnostic Tests:  CLINICAL DATA:  Thoracic aortic aneurysm follow-up.   EXAM: CT ANGIOGRAPHY CHEST WITH CONTRAST   TECHNIQUE: Multidetector CT imaging of the chest was performed using the standard protocol during bolus administration of intravenous contrast. Multiplanar CT image reconstructions and MIPs were obtained to evaluate the vascular anatomy.   CONTRAST:  33m ISOVUE-370 IOPAMIDOL (ISOVUE-370) INJECTION 76%   COMPARISON:  CT chest angiogram 10/16/2019   FINDINGS: Cardiovascular: Stable 4.5 cm ascending thoracic aortic aneurysm (series 5, image 82). The descending thoracic aorta is normal in caliber. Three-vessel aortic arch; the great vessels are patent without stenosis. Heart is normal in size. No abnormal pericardial fluid.   Mediastinum/Nodes: No enlarged mediastinal, hilar, or axillary lymph nodes. Thyroid gland, trachea, and esophagus demonstrate no significant findings.   Lungs/Pleura: Upper lobe predominant paraseptal emphysema. Azygous lobe. Benign 0.4 cm nodule the anterior left upper lobe (series 7, image 45). Emphysema (ICD10-J43.9). Thin scarring in the lingula and left lower lobe.   Upper Abdomen: A 2.0 cm hypoattenuating lesion with peripheral enhancement in the inferior right liver is stable and consistent with benign hemangioma (series 5, image 162). Several other scattered hypodensities are unchanged compared to prior exam, likely benign hepatic cysts. Remainder of the visualized upper abdomen is unremarkable.   Musculoskeletal: No chest wall abnormality. No acute or significant osseous  findings.   Review of the MIP images confirms the above findings.   IMPRESSION: 1. Stable 4.5 cm ascending thoracic aortic aneurysm. Aortic aneurysm NOS (ICD10-I71.9). 2.  Emphysema (ICD10-J43.9).     Electronically Signed   By: Ileana Roup M.D.   On: 10/14/2020 14:01     Impression:  This 70 year old gentleman has a stable 4.5 cm fusiform  ascending aortic aneurysm.  This is well below the surgical threshold of 5.5 cm.  Previous echocardiogram (2019) has shown a normal trileaflet aortic valve.  I reviewed the CT images with the patient and answered his questions.  I stressed the importance of continued good blood pressure control and preventing further enlargement and acute aortic dissection.  I recommended a follow-up scan in 1 year to establish stability.  Hx Afib-on Atenolol and Amio with good control.     Plan:  He will return to one of the PAs  in 1 year with a CTA of the chest. He has an Echo scheduled later this month.   I spent 20 minutes performing this established patient evaluation and > 50% of this time was spent face to face counseling and coordinating the care of this patient's aortic aneurysm.   Nicholes Rough, PA-C

## 2020-10-20 ENCOUNTER — Other Ambulatory Visit: Payer: Self-pay | Admitting: Family

## 2020-10-21 ENCOUNTER — Ambulatory Visit: Payer: Medicare HMO | Admitting: Cardiology

## 2020-10-21 ENCOUNTER — Other Ambulatory Visit: Payer: Medicare HMO

## 2020-10-21 ENCOUNTER — Other Ambulatory Visit: Payer: Self-pay

## 2020-10-21 ENCOUNTER — Encounter: Payer: Self-pay | Admitting: Cardiology

## 2020-10-21 VITALS — BP 120/70 | HR 55 | Ht 69.0 in | Wt 184.0 lb

## 2020-10-21 DIAGNOSIS — I4819 Other persistent atrial fibrillation: Secondary | ICD-10-CM

## 2020-10-21 DIAGNOSIS — I7121 Aneurysm of the ascending aorta, without rupture: Secondary | ICD-10-CM

## 2020-10-21 DIAGNOSIS — I712 Thoracic aortic aneurysm, without rupture: Secondary | ICD-10-CM

## 2020-10-21 DIAGNOSIS — I341 Nonrheumatic mitral (valve) prolapse: Secondary | ICD-10-CM

## 2020-10-21 MED ORDER — APIXABAN 5 MG PO TABS
5.0000 mg | ORAL_TABLET | Freq: Two times a day (BID) | ORAL | 6 refills | Status: DC
Start: 2020-10-21 — End: 2021-10-18

## 2020-10-21 NOTE — Patient Instructions (Addendum)
Medication Instructions:  Your physician has recommended you make the following change in your medication:   1) START eliquis 5 mg: take 1 tablet by mouth TWICE daily   2) STOP aspirin  Samples Given: Eliquis 5 mg Lot: [id number] Exp: 07/2022 # 2 boxes given  *If you need a refill on your cardiac medications before your next appointment, please call your pharmacy*   Lab Work: - pending procedure date  If you have labs (blood work) drawn today and your tests are completely normal, you will receive your results only by: [place] (if you have MyChart) OR A paper copy in the mail If you have any lab test that is abnormal or we need to change your treatment, we will call you to review the results.   Testing/Procedures: - Your physician has recommended that you have an Atrial Fibrillation ablation- in 4 weeks. Catheter ablation is a medical procedure used to treat some cardiac arrhythmias (irregular heartbeats). During catheter ablation, a long, thin, flexible tube is put into a blood vessel in your groin (upper thigh), or neck. This tube is called an ablation catheter. It is then guided to your heart through the blood vessel. Radio frequency waves destroy small areas of heart tissue where abnormal heartbeats may cause an arrhythmia to start.  We will call you to schedule a procedure date (Tarra Pence/ [place]).     Follow-Up: At [facility], you and your health needs are our priority.  As part of our continuing mission to provide you with exceptional heart care, we have created designated Provider Care Teams.  These Care Teams include your primary Cardiologist (physician) and Advanced Practice Providers (APPs -  Physician Assistants and Nurse Practitioners) who all work together to provide you with the care you need, when you need it.  We recommend signing up for the patient portal called "MyChart".  Sign up information is provided on this After Visit Summary.  MyChart is used to connect  with patients for Virtual Visits (Telemedicine).  Patients are able to view lab/test results, encounter notes, upcoming appointments, etc.  Non-urgent messages can be sent to your provider as well.   To learn more about what you can do with MyChart, go to [email or web address].    Your next appointment:   Pending ablation date   The format for your next appointment:   In Person  Provider:   Lars Mage, MD   Other Instructions  ELIQUIS (Apixaban) Tablets What is this medication? APIXABAN (a PIX a ban) prevents or treats blood clots. It is also used to lower the risk of stroke in people with AFib (atrial fibrillation). It belongs to a group of medications called blood thinners. This medicine may be used for other purposes; ask your health care provider or pharmacist if you have questions. COMMON BRAND NAME(S): Eliquis What should I tell my care team before I take this medication? They need to know if you have any of these conditions: Antiphospholipid antibody syndrome Bleeding disorder History of bleeding in the brain History of blood clots History of stomach bleeding Kidney disease Liver disease Mechanical heart valve Spinal surgery An unusual or allergic reaction to apixaban, other medications, foods, dyes, or preservatives Pregnant or trying to get pregnant Breast-feeding How should I use this medication? Take this medication by mouth. For your therapy to work as well as possible, take each dose exactly as prescribed on the prescription label. Do not skip doses. Skipping doses or stopping this medication can increase your risk of a  blood clot or stroke. Keep taking this medication unless your care team tells you to stop. Take it as directed on the prescription label at the same time every day. You can take it with or without food. If it upsets your stomach, take it with food. A special MedGuide will be given to you by the pharmacist with each prescription and refill. Be  sure to read this information carefully each time. Talk to your care team about the use of this medication in children. Special care may be needed. Overdosage: If you think you have taken too much of this medicine contact a poison control center or emergency room at once. NOTE: This medicine is only for you. Do not share this medicine with others. What if I miss a dose? If you miss a dose, take it as soon as you can. If it is almost time for your next dose, take only that dose. Do not take double or extra doses. What may interact with this medication? This medication may interact with the following: Aspirin and aspirin-like medications Certain medications for fungal infections like itraconazole and ketoconazole Certain medications for seizures like carbamazepine and phenytoin Certain medications for blood clots like enoxaparin, dalteparin, heparin, and warfarin Clarithromycin NSAIDs, medications for pain and inflammation, like ibuprofen or naproxen Rifampin Ritonavir St. John's wort This list may not describe all possible interactions. Give your health care provider a list of all the medicines, herbs, non-prescription drugs, or dietary supplements you use. Also tell them if you smoke, drink alcohol, or use illegal drugs. Some items may interact with your medicine. What should I watch for while using this medication? Visit your healthcare professional for regular checks on your progress. You may need blood work done while you are taking this medication. Your condition will be monitored carefully while you are receiving this medication. It is important not to miss any appointments. Avoid sports and activities that might cause injury while you are using this medication. Severe falls or injuries can cause unseen bleeding. Be careful when using sharp tools or knives. Consider using an Copy. Take special care brushing or flossing your teeth. Report any injuries, bruising, or red spots on the  skin to your healthcare professional. If you are going to need surgery or other procedure, tell your healthcare professional that you are taking this medication. Wear a medical ID bracelet or chain. Carry a card that describes your disease and details of your medication and dosage times. What side effects may I notice from receiving this medication? Side effects that you should report to your care team as soon as possible: Allergic reactions-skin rash, itching, hives, swelling of the face, lips, tongue, or throat Bleeding-bloody or black, tar-like stools, vomiting blood or brown material that looks like coffee grounds, red or dark brown urine, small red or purple spots on the skin, unusual bruising or bleeding Bleeding in the brain-severe headache, stiff neck, confusion, dizziness, change in vision, numbness or weakness of the face, arm, or leg, trouble speaking, trouble walking, vomiting Heavy periods This list may not describe all possible side effects. Call your doctor for medical advice about side effects. You may report side effects to FDA at 1-800-FDA-1088. Where should I keep my medication? Keep out of the reach of children and pets. Store at room temperature between 20 and 25 degrees C (68 and 77 degrees F). Get rid of any unused medication after the expiration date. To get rid of medications that are no longer needed or expired: Take  the medication to a medication take-back program. Check with your pharmacy or law enforcement to find a location. If you cannot return the medication, check the label or package insert to see if the medication should be thrown out in the garbage or flushed down the toilet. If you are not sure, ask your care team. If it is safe to put in the trash, empty the medication out of the container. Mix the medication with cat litter, dirt, coffee grounds, or other unwanted substance. Seal the mixture in a bag or container. Put it in the trash. NOTE: This sheet is a  summary. It may not cover all possible information. If you have questions about this medicine, talk to your doctor, pharmacist, or health care provider.  2022 Elsevier/Gold Standard (2020-03-06 12:37:23)   Cardiac Ablation Cardiac ablation is a procedure to destroy, or ablate, a small amount of heart tissue in very specific places. The heart has many electrical connections. Sometimes these connections are abnormal and can cause the heart to beat very fast or irregularly. Ablating some of the areas that cause problems can improve the heart's rhythm or return it to normal. Ablation may be done for people who: Have Wolff-Parkinson-White syndrome. Have fast heart rhythms (tachycardia). Have taken medicines for an abnormal heart rhythm (arrhythmia) that were not effective or caused side effects. Have a high-risk heartbeat that may be life-threatening. During the procedure, a small incision is made in the neck or the groin, and a long, thin tube (catheter) is inserted into the incision and moved to the heart. Small devices (electrodes) on the tip of the catheter will send out electrical currents. A type of X-ray (fluoroscopy) will be used to help guide the catheter and to provide images of the heart. Tell a health care provider about: Any allergies you have. All medicines you are taking, including vitamins, herbs, eye drops, creams, and over-the-counter medicines. Any problems you or family members have had with anesthetic medicines. Any blood disorders you have. Any surgeries you have had. Any medical conditions you have, such as kidney failure. Whether you are pregnant or may be pregnant. What are the risks? Generally, this is a safe procedure. However, problems may occur, including: Infection. Bruising and bleeding at the catheter insertion site. Bleeding into the chest, especially into the sac that surrounds the heart. This is a serious complication. Stroke or blood clots. Damage to nearby  structures or organs. Allergic reaction to medicines or dyes. Need for a permanent pacemaker if the normal electrical system is damaged. A pacemaker is a small computer that sends electrical signals to the heart and helps your heart beat normally. The procedure not being fully effective. This may not be recognized until months later. Repeat ablation procedures are sometimes done. What happens before the procedure? Medicines Ask your health care provider about: Changing or stopping your regular medicines. This is especially important if you are taking diabetes medicines or blood thinners. Taking medicines such as aspirin and ibuprofen. These medicines can thin your blood. Do not take these medicines unless your health care provider tells you to take them. Taking over-the-counter medicines, vitamins, herbs, and supplements. General instructions Follow instructions from your health care provider about eating or drinking restrictions. Plan to have someone take you home from the hospital or clinic. If you will be going home right after the procedure, plan to have someone with you for 24 hours. Ask your health care provider what steps will be taken to prevent infection. What happens during the  procedure?  An IV will be inserted into one of your veins. You will be given a medicine to help you relax (sedative). The skin on your neck or groin will be numbed. An incision will be made in your neck or your groin. A needle will be inserted through the incision and into a large vein in your neck or groin. A catheter will be inserted into the needle and moved to your heart. Dye may be injected through the catheter to help your surgeon see the area of the heart that needs treatment. Electrical currents will be sent from the catheter to ablate heart tissue in desired areas. There are three types of energy that may be used to do this: Heat (radiofrequency energy). Laser energy. Extreme cold  (cryoablation). When the tissue has been ablated, the catheter will be removed. Pressure will be held on the insertion area to prevent a lot of bleeding. A bandage (dressing) will be placed over the insertion area. The exact procedure may vary among health care providers and hospitals. What happens after the procedure? Your blood pressure, heart rate, breathing rate, and blood oxygen level will be monitored until you leave the hospital or clinic. Your insertion area will be monitored for bleeding. You will need to lie still for a few hours to ensure that you do not bleed from the insertion area. Do not drive for 24 hours or as long as told by your health care provider. Summary Cardiac ablation is a procedure to destroy, or ablate, a small amount of heart tissue using an electrical current. This procedure can improve the heart rhythm or return it to normal. Tell your health care provider about any medical conditions you may have and all medicines you are taking to treat them. This is a safe procedure, but problems may occur. Problems may include infection, bruising, damage to nearby organs or structures, or allergic reactions to medicines. Follow your health care provider's instructions about eating and drinking before the procedure. You may also be told to change or stop some of your medicines. After the procedure, do not drive for 24 hours or as long as told by your health care provider. This information is not intended to replace advice given to you by your health care provider. Make sure you discuss any questions you have with your health care provider. Document Revised: 12/17/2018 Document Reviewed: 12/17/2018 Elsevier Patient Education  [employer].

## 2020-10-21 NOTE — Progress Notes (Signed)
Electrophysiology Office Note:    Date:  10/21/2020   ID:  Bobby Flowers, DOB 1950/09/06, MRN [id number]  PCP:  [name], MD  [facility] Cardiologist:  [name], MD  [facility] Electrophysiologist:  Vickie Epley, MD   Referring MD: [name], MD   Chief Complaint: Atrial fibrillation  History of Present Illness:    Bobby Flowers is a 70 y.o. male who presents for an evaluation of atrial fibrillation at the request of Dr. Fletcher Anon. Their medical history includes paroxysmal atrial fibrillation diagnosed in the 1980s, mitral valve prolapse with mild MR, dilated aortic root.  He has been on chronic amiodarone and is seeking an alternative to rhythm control.  He is symptomatic when he has salvos of atrial fibrillation with palpitations and fatigue.  He continues to have breakthrough atrial fibrillation episodes despite treatment with amiodarone 200 mg by mouth daily.  He is not on anticoagulation.  In the past he was on Coumadin but this led to a rash.  He has not had bleeding issues.  Past Medical History:  Diagnosis Date   Chest tightness    a. 05/2017 Cath: nl cors.   Chronic pain of left lower extremity    right   DDD (degenerative disc disease), lumbosacral    Dilated aortic root ([place])    a. 06/2017 Echo: Ao root 4.4cm.   Dysrhythmia    afib   Headache    migraines in past. none for 6-8 yrs.   History of kidney stones    Hypothyroidism    Mitral valve prolapse    a. 09/2015 Echo: EF 55-60%, no rwma, nl LA size, nl RV fxn, nl PASP; b. 06/2017 Echo: EF 55-60%, no rwma, Gr2 DD. Asc Ao 4.4cm. Mild MR (? mild prolapse). Mildly dil LA.   PAF (paroxysmal atrial fibrillation) ([place])    a. Dx in 1980's; b. CHA2DS2VASc = 1 - briefly on coumadin many years ago-->caused rash; c. Chronic Amio '200mg'$  QD.   Wears contact lenses     Past Surgical History:  Procedure Laterality Date   CARDIAC CATHETERIZATION     COLONOSCOPY  04/25/2005   COLONOSCOPY WITH PROPOFOL  N/A 07/22/2015   Procedure: COLONOSCOPY WITH PROPOFOL;  Surgeon: Robert Bellow, MD;  Location: [facility] ENDOSCOPY;  Service: Endoscopy;  Laterality: N/A;   COLONOSCOPY WITH PROPOFOL N/A 09/04/2020   Procedure: COLONOSCOPY WITH PROPOFOL;  Surgeon: Robert Bellow, MD;  Location: [facility] ENDOSCOPY;  Service: Endoscopy;  Laterality: N/A;   CYSTOSCOPY WITH HOLMIUM LASER LITHOTRIPSY Bilateral 07/28/2017   Procedure: CYSTOSCOPY WITH HOLMIUM LASER LITHOTRIPSY;  Surgeon: Lucas Mallow, MD;  Location: [facility] ORS;  Service: Urology;  Laterality: Bilateral;   CYSTOSCOPY WITH STENT PLACEMENT Bilateral 07/28/2017   Procedure: CYSTOSCOPY WITH STENT PLACEMENT;  Surgeon: Lucas Mallow, MD;  Location: [facility] ORS;  Service: Urology;  Laterality: Bilateral;   CYSTOSCOPY WITH URETEROSCOPY Bilateral 07/28/2017   Procedure: CYSTOSCOPY WITH URETEROSCOPY;  Surgeon: Lucas Mallow, MD;  Location: [facility] ORS;  Service: Urology;  Laterality: Bilateral;   EYE SURGERY Left    cataract surgery   HEMORROIDECTOMY     HERNIA REPAIR Left 05/22/2015   Left inguinal hernia repair with medium Ultra Pro mesh   INGUINAL HERNIA REPAIR Left 07/22/2015   Procedure: HERNIA REPAIR INGUINAL ADULT;  Surgeon: Robert Bellow, MD;  Location: [facility] ORS;  Service: General;  Laterality: Left;   LEFT HEART CATH AND CORONARY ANGIOGRAPHY Left 05/29/2017   Procedure: LEFT HEART  CATH AND CORONARY ANGIOGRAPHY;  Surgeon: [name], MD;  Location: [place] CV LAB;  Service: Cardiovascular;  Laterality: Left;   LUMBAR FUSION N/A    LUMBAR LAMINECTOMY/DECOMPRESSION MICRODISCECTOMY Right 07/01/2019   Procedure: OPEN L4 LAMINECTOMY, L3/4 DISCECTOMY ON RIGHT, L4/5 DISCECTOMY;  Surgeon: Deetta Perla, MD;  Location: [facility] ORS;  Service: Neurosurgery;  Laterality: Right;   myringotomy     with tubes x 5.  tubes fall out frequently   MYRINGOTOMY WITH TUBE PLACEMENT Left 12/13/2018   Procedure: MYRINGOTOMY WITH T-TUBE PLACEMENT;  Surgeon:  [name], MD;  Location: [place];  Service: ENT;  Laterality: Left;  needs to stay first case vaught / juengel doing case together   NASAL SINUS SURGERY     NASOPHARYNGOSCOPY EUSTATION TUBE BALLOON DILATION Left 12/13/2018   Procedure: NASOPHARYNGOSCOPY EUSTATION TUBE BALLOON DILATION WITH OUTFRACTURE OF TURBINATES;  Surgeon: [name], MD;  Location: [place];  Service: ENT;  Laterality: Left;   SPINE SURGERY N/A    VASECTOMY      Current Medications: Current Meds  Medication Sig   acetaminophen (TYLENOL) 500 MG tablet Take 500 mg by mouth every 6 (six) hours as needed for moderate pain or headache.   amiodarone (PACERONE) 200 MG tablet TAKE ONE TABLET BY MOUTH DAILY   aspirin EC 81 MG tablet Take 81 mg by mouth daily. Swallow whole.   atenolol (TENORMIN) 25 MG tablet TAKE ONE TABLET BY MOUTH DAILY   Carboxymethylcellulose Sodium (ARTIFICIAL TEARS OP) Place 1 drop into both eyes daily.   latanoprost (XALATAN) 0.005 % ophthalmic solution Place 1 drop into both eyes at bedtime.    levothyroxine (SYNTHROID, LEVOTHROID) 75 MCG tablet Take 75 mcg by mouth See admin instructions. Take 75 mcg daily Monday through Saturday, skip Sunday dose   SUMAtriptan (IMITREX) 100 MG tablet Take 100 mg by mouth every 2 (two) hours as needed for migraine. May repeat in 2 hours if headache persists or recurs.     Allergies:   Adhesive [tape] and Coumadin [warfarin]   Social History   Socioeconomic History   Marital status: Married    Spouse name: linda   Number of children: Not on file   Years of education: Not on file   Highest education level: Not on file  Occupational History   Occupation: customer service rep    Employer: [employer]  Tobacco Use   Smoking status: Former    Packs/day: 1.50    Years: 31.00    Pack years: 46.50    Types: Cigarettes    Quit date: 07/14/2000    Years since quitting: 20.2   Smokeless tobacco: Never  Vaping Use    Vaping Use: Never used  Substance and Sexual Activity   Alcohol use: No   Drug use: No   Sexual activity: Not Currently  Other Topics Concern   Not on file  Social History Narrative   Patient lives with wife.   Social Determinants of Health   Financial Resource Strain: Not on file  Food Insecurity: Not on file  Transportation Needs: Not on file  Physical Activity: Not on file  Stress: Not on file  Social Connections: Not on file     Family History: The patient's family history includes Alcoholism in his brother; Heart failure in his mother; Leukemia in his father.  ROS:   Please see the history of present illness.    All other systems reviewed and are negative.  EKGs/Labs/Other Studies Reviewed:  The following studies were reviewed today:  October 14, 2020 CT angio chest reviewed personally Heart with significant rotation Left common PV ostium   Echocardiogram scheduled for tomorrow   May 2019 echo Normal ejection fraction No significant valvular abnormalities Mild left atrial dilation  EKG:  The ekg ordered today demonstrates sinus rhythm with a first-degree AV delay and an abnormal QRS axis  Recent Labs: No results found for requested labs within last 8760 hours.  Recent Lipid Panel No results found for: CHOL, TRIG, HDL, CHOLHDL, VLDL, LDLCALC, LDLDIRECT  Physical Exam:    VS:  BP 120/70 (BP Location: Left Arm, Patient Position: Sitting, Cuff Size: Normal)   Pulse (!) 55   Ht '5\' 9"'$  (1.753 m)   Wt 184 lb (83.5 kg)   BMI 27.17 kg/m     Wt Readings from Last 3 Encounters:  10/21/20 184 lb (83.5 kg)  10/15/20 183 lb 9.6 oz (83.3 kg)  09/04/20 185 lb (83.9 kg)     GEN:  Well nourished, well developed in no acute distress HEENT: Normal NECK: No JVD; No carotid bruits LYMPHATICS: No lymphadenopathy CARDIAC: RRR, no murmurs, rubs, gallops RESPIRATORY:  Clear to auscultation without rales, wheezing or rhonchi  ABDOMEN: Soft, non-tender,  non-distended MUSCULOSKELETAL:  No edema; No deformity  SKIN: Warm and dry NEUROLOGIC:  Alert and oriented x 3 PSYCHIATRIC:  Normal affect   ASSESSMENT:    1. Persistent atrial fibrillation ([place])   2. Mitral valve prolapse   3. Ascending aortic aneurysm (HCC)    PLAN:    In order of problems listed above:  1. Persistent atrial fibrillation (HCC) Symptomatic.  Is on chronic amiodarone and would like to avoid long-term use of this medication if possible.  I think this is a very reasonable goal given his relatively young age.  We discussed the strategies to transition off amiodarone including stopping the medication, letting it washout over the course of 4 to 6 months and starting dofetilide.  We also discussed using ablation as a strategy to avoid long-term exposure to amiodarone and hopefully maintain rhythm control.  We discussed the possibility that we would need antiarrhythmic drug therapy after an ablation procedure given the chronicity of his atrial fibrillation.  We discussed the ablation procedure in detail including the risks, recovery and efficacy.  We discussed the need for anticoagulation.  I would like to start him on Eliquis 5 mg by mouth twice daily for stroke prophylaxis.  He will need to be on this medication at least for 4 weeks prior to the ablation procedure. He can stop his aspirin when he starts Eliquis.  Risk, benefits, and alternatives to EP study and radiofrequency ablation for afib were also discussed in detail today. These risks include but are not limited to stroke, bleeding, vascular damage, tamponade, perforation, damage to the esophagus, lungs, and other structures, pulmonary vein stenosis, worsening renal function, and death. The patient understands these risk and wishes to proceed.  We will therefore proceed with catheter ablation at the next available time.  Carto, ICE, anesthesia are requested for the procedure.    He does not need a CT scan prior to the  ablation because of his recent CT chest.   2. Mitral valve prolapse Mild MR.  Has repeat echocardiogram scheduled for tomorrow.  We will reassess his mitral valve regurgitation severity at that time.  3. Ascending aortic aneurysm (HCC) Followed by Dr. Cyndia Bent and CTS.  Stable dimensions        Total time  spent with patient today 65 minutes. This includes reviewing records, evaluating the patient and coordinating care.  Medication Adjustments/Labs and Tests Ordered: Current medicines are reviewed at length with the patient today.  Concerns regarding medicines are outlined above.  No orders of the defined types were placed in this encounter.  No orders of the defined types were placed in this encounter.    Signed, Hilton Cork. Quentin Ore, MD, [facility], [facility] 10/21/2020 10:35 AM    Electrophysiology [facility]

## 2020-10-22 ENCOUNTER — Ambulatory Visit (INDEPENDENT_AMBULATORY_CARE_PROVIDER_SITE_OTHER): Payer: Medicare HMO

## 2020-10-22 DIAGNOSIS — I4819 Other persistent atrial fibrillation: Secondary | ICD-10-CM

## 2020-10-22 DIAGNOSIS — R0600 Dyspnea, unspecified: Secondary | ICD-10-CM

## 2020-10-22 LAB — ECHOCARDIOGRAM COMPLETE
AR max vel: 1.62 cm2
AV Area VTI: 1.72 cm2
AV Area mean vel: 1.75 cm2
AV Mean grad: 5 mmHg
AV Peak grad: 10 mmHg
Ao pk vel: 1.58 m/s
Area-P 1/2: 3.43 cm2
MV VTI: 1.97 cm2
S' Lateral: 2.8 cm

## 2020-10-23 ENCOUNTER — Other Ambulatory Visit: Payer: Self-pay | Admitting: *Deleted

## 2020-10-23 ENCOUNTER — Encounter: Payer: Self-pay | Admitting: *Deleted

## 2020-10-23 DIAGNOSIS — I4819 Other persistent atrial fibrillation: Secondary | ICD-10-CM

## 2020-10-23 DIAGNOSIS — Z01812 Encounter for preprocedural laboratory examination: Secondary | ICD-10-CM

## 2020-10-29 ENCOUNTER — Other Ambulatory Visit: Payer: Self-pay

## 2020-10-29 ENCOUNTER — Telehealth: Payer: Self-pay | Admitting: Cardiology

## 2020-10-29 ENCOUNTER — Encounter: Payer: Self-pay | Admitting: Internal Medicine

## 2020-10-29 ENCOUNTER — Ambulatory Visit: Payer: Medicare HMO | Admitting: Internal Medicine

## 2020-10-29 VITALS — BP 122/70 | HR 65 | Temp 98.0°F | Ht 69.0 in | Wt 182.4 lb

## 2020-10-29 DIAGNOSIS — R918 Other nonspecific abnormal finding of lung field: Secondary | ICD-10-CM

## 2020-10-29 DIAGNOSIS — R9389 Abnormal findings on diagnostic imaging of other specified body structures: Secondary | ICD-10-CM | POA: Diagnosis not present

## 2020-10-29 DIAGNOSIS — R0602 Shortness of breath: Secondary | ICD-10-CM | POA: Diagnosis not present

## 2020-10-29 DIAGNOSIS — J984 Other disorders of lung: Secondary | ICD-10-CM

## 2020-10-29 DIAGNOSIS — T462X5A Adverse effect of other antidysrhythmic drugs, initial encounter: Secondary | ICD-10-CM

## 2020-10-29 MED ORDER — SPIRIVA RESPIMAT 2.5 MCG/ACT IN AERS: 2.0000 | INHALATION_SPRAY | Freq: Every day | RESPIRATORY_TRACT | 0 refills | Status: DC

## 2020-10-29 NOTE — Progress Notes (Signed)
Patient seen in the office today and instructed on use of sprivia.  Patient expressed understanding and demonstrated technique.

## 2020-10-29 NOTE — Progress Notes (Signed)
Left detailed message for Pt.  Advised ablation cancelled.  Will keep 3 month follow up.  Await further needs.

## 2020-10-29 NOTE — Progress Notes (Signed)
[facility] Consultation      Date: 10/29/2020,   MRN# [id number] Bobby Flowers [birth year]      Admission                  Current  BAHE SANKEY is a 70 y.o. old male seen in consultation for SOB at the request of [place]. Possible AMIODARONE LUNG TOXICITY    CHIEF COMPLAINT:  SOB x 4 months    HISTORY OF PRESENT ILLNESS  70 y.o. male who presents for complaints of SOB H/o persistent atrial fibrillation  and mitral valve prolapse with mild regurgitation.   He has prolonged history of atrial fibrillation which was diagnosed in the 97s.  He was briefly on warfarin but had an allergic reaction with a rash. He has not been on any anticoagulant since then given his low CHADS2 VASc score.   He quit smoking in 2002 and does not consume alcohol. In 2015, patient did not undergo ablation therapy and was given low dose AMIODARONE  He had recurrent atrial fibrillation in 2016 but he converted back to sinus rhythm after increasing the dose of amiodarone.     He had left heart catheterization in 2019 which showed normal coronary arteries with very unusual horizontal orientation of the heart. Echocardiogram 2019 showed normal LV systolic function with moderately dilated ascending aorta at 4.4 cm.  His atrial fibrillation has been well controlled with amiodarone 200 mg once daily. He had back surgery twice last year and feels that he became deconditioned after that.   Since then he has been having DOE and SOB No previous DX of COPD or PNEUMONIA  I was refered By Dr Fletcher Anon to assess if patient has pulmonary toxicity from amiodarone CT chest     CT of the chest from August 2022 reviewed with patient Independent review today 10/29/2020 Bilateral paraseptal cystic changes consistent with emphysema There are several subcentimeter pulmonary nodules bilaterally Follow-up CT chest in 1 year   PAST MEDICAL HISTORY   Past Medical History:  Diagnosis Date   Chest  tightness    a. 05/2017 Cath: nl cors.   Chronic pain of left lower extremity    right   DDD (degenerative disc disease), lumbosacral    Dilated aortic root ([place])    a. 06/2017 Echo: Ao root 4.4cm.   Dysrhythmia    afib   Headache    migraines in past. none for 6-8 yrs.   History of kidney stones    Hypothyroidism    Mitral valve prolapse    a. 09/2015 Echo: EF 55-60%, no rwma, nl LA size, nl RV fxn, nl PASP; b. 06/2017 Echo: EF 55-60%, no rwma, Gr2 DD. Asc Ao 4.4cm. Mild MR (? mild prolapse). Mildly dil LA.   PAF (paroxysmal atrial fibrillation) ([place])    a. Dx in 1980's; b. CHA2DS2VASc = 1 - briefly on coumadin many years ago-->caused rash; c. Chronic Amio '200mg'$  QD.   Wears contact lenses      SURGICAL HISTORY   Past Surgical History:  Procedure Laterality Date   CARDIAC CATHETERIZATION     COLONOSCOPY  04/25/2005   COLONOSCOPY WITH PROPOFOL N/A 07/22/2015   Procedure: COLONOSCOPY WITH PROPOFOL;  Surgeon: Robert Bellow, MD;  Location: [facility] ENDOSCOPY;  Service: Endoscopy;  Laterality: N/A;   COLONOSCOPY WITH PROPOFOL N/A 09/04/2020   Procedure: COLONOSCOPY WITH PROPOFOL;  Surgeon: Robert Bellow, MD;  Location: [facility] ENDOSCOPY;  Service: Endoscopy;  Laterality: N/A;   CYSTOSCOPY WITH  HOLMIUM LASER LITHOTRIPSY Bilateral 07/28/2017   Procedure: CYSTOSCOPY WITH HOLMIUM LASER LITHOTRIPSY;  Surgeon: Lucas Mallow, MD;  Location: [facility] ORS;  Service: Urology;  Laterality: Bilateral;   CYSTOSCOPY WITH STENT PLACEMENT Bilateral 07/28/2017   Procedure: CYSTOSCOPY WITH STENT PLACEMENT;  Surgeon: Lucas Mallow, MD;  Location: [facility] ORS;  Service: Urology;  Laterality: Bilateral;   CYSTOSCOPY WITH URETEROSCOPY Bilateral 07/28/2017   Procedure: CYSTOSCOPY WITH URETEROSCOPY;  Surgeon: Lucas Mallow, MD;  Location: [facility] ORS;  Service: Urology;  Laterality: Bilateral;   EYE SURGERY Left    cataract surgery   HEMORROIDECTOMY     HERNIA REPAIR Left 05/22/2015   Left inguinal  hernia repair with medium Ultra Pro mesh   INGUINAL HERNIA REPAIR Left 07/22/2015   Procedure: HERNIA REPAIR INGUINAL ADULT;  Surgeon: Robert Bellow, MD;  Location: [facility] ORS;  Service: General;  Laterality: Left;   LEFT HEART CATH AND CORONARY ANGIOGRAPHY Left 05/29/2017   Procedure: LEFT HEART CATH AND CORONARY ANGIOGRAPHY;  Surgeon: [name], MD;  Location: [facility] CV [facility];  Service: Cardiovascular;  Laterality: Left;   LUMBAR FUSION N/A    LUMBAR LAMINECTOMY/DECOMPRESSION MICRODISCECTOMY Right 07/01/2019   Procedure: OPEN L4 LAMINECTOMY, L3/4 DISCECTOMY ON RIGHT, L4/5 DISCECTOMY;  Surgeon: Deetta Perla, MD;  Location: [facility] ORS;  Service: Neurosurgery;  Laterality: Right;   myringotomy     with tubes x 5.  tubes fall out frequently   MYRINGOTOMY WITH TUBE PLACEMENT Left 12/13/2018   Procedure: MYRINGOTOMY WITH T-TUBE PLACEMENT;  Surgeon: [name], MD;  Location: [facility];  Service: ENT;  Laterality: Left;  needs to stay first case vaught / juengel doing case together   NASAL SINUS SURGERY     NASOPHARYNGOSCOPY EUSTATION TUBE BALLOON DILATION Left 12/13/2018   Procedure: NASOPHARYNGOSCOPY EUSTATION TUBE BALLOON DILATION WITH OUTFRACTURE OF TURBINATES;  Surgeon: [name], MD;  Location: [facility];  Service: ENT;  Laterality: Left;   SPINE SURGERY N/A    VASECTOMY       FAMILY HISTORY   Family History  Problem Relation Age of Onset   Heart failure Mother    Leukemia Father    Alcoholism Brother      SOCIAL HISTORY   Social History   Tobacco Use   Smoking status: Former    Packs/day: 1.50    Years: 31.00    Pack years: 46.50    Types: Cigarettes    Quit date: 07/14/2000    Years since quitting: 20.3   Smokeless tobacco: Never  Vaping Use   Vaping Use: Never used  Substance Use Topics   Alcohol use: No   Drug use: No     MEDICATIONS    Home Medication:  Current Outpatient Rx   Order #: [id number] Class:  Historical Med   Order #: [id number] Class: Normal   Order #: [id number] Class: Normal   Order #: [id number] Class: Normal   Order #: [id number] Class: Historical Med   Order #: [id number] Class: Historical Med   Order #: [id number] Class: Historical Med   Order #: [id number] Class: Historical Med   Order #: [id number] Class: Historical Med   Order #: [id number] Class: Historical Med   Order #: [id number] Class: Historical Med    Current Medication:  Current Outpatient Medications:    acetaminophen (TYLENOL) 500 MG tablet, Take 500 mg by mouth every 6 (six) hours as needed for moderate pain or headache., Disp: , Rfl:    amiodarone (PACERONE) 200 MG tablet, TAKE ONE TABLET BY MOUTH DAILY, Disp: 90  tablet, Rfl: 1   apixaban (ELIQUIS) 5 MG TABS tablet, Take 1 tablet (5 mg total) by mouth 2 (two) times daily., Disp: 60 tablet, Rfl: 6   atenolol (TENORMIN) 25 MG tablet, TAKE ONE TABLET BY MOUTH DAILY, Disp: 90 tablet, Rfl: 3   Carboxymethylcellulose Sodium (ARTIFICIAL TEARS OP), Place 1 drop into both eyes daily., Disp: , Rfl:    gabapentin (NEURONTIN) 300 MG capsule, Take 300-600 mg by mouth See admin instructions. Take 300 mg in the morning and 600 mg at bedtime (Patient not taking: Reported on 10/21/2020), Disp: , Rfl: 0   latanoprost (XALATAN) 0.005 % ophthalmic solution, Place 1 drop into both eyes at bedtime. , Disp: , Rfl:    levothyroxine (SYNTHROID, LEVOTHROID) 75 MCG tablet, Take 75 mcg by mouth See admin instructions. Take 75 mcg daily Monday through Saturday, skip Sunday dose, Disp: , Rfl:    polyethylene glycol powder (GLYCOLAX/MIRALAX) 17 GM/SCOOP powder, Take by mouth. (Patient not taking: Reported on 10/21/2020), Disp: , Rfl:    senna-docusate (SENOKOT-S) 8.6-50 MG tablet, Take 2 tablets by mouth daily. (Patient not taking: Reported on 10/21/2020), Disp: , Rfl:    SUMAtriptan (IMITREX) 100 MG tablet, Take 100 mg by mouth every 2 (two) hours as needed for migraine. May repeat in 2 hours if headache  persists or recurs., Disp: , Rfl:     ALLERGIES   Adhesive [tape] and Coumadin [warfarin]     REVIEW OF SYSTEMS    Review of Systems:  Gen:  Denies  fever, sweats, chills weigh loss  HEENT: Denies blurred vision, double vision, ear pain, eye pain, hearing loss, nose bleeds, sore throat Cardiac:  No dizziness, chest pain or heaviness, chest tightness,edema Resp:   Denies cough or sputum porduction, shortness of breath,wheezing, hemoptysis,  Gi: Denies swallowing difficulty, stomach pain, nausea or vomiting, diarrhea, constipation, bowel incontinence Gu:  Denies bladder incontinence, burning urine Ext:   Denies Joint pain, stiffness or swelling Skin: Denies  skin rash, easy bruising or bleeding or hives Endoc:  Denies polyuria, polydipsia , polyphagia or weight change Psych:   Denies depression, insomnia or hallucinations   Other:  All other systems negative BP 122/70 (BP Location: Left Arm, Patient Position: Sitting, Cuff Size: Normal)   Pulse 65   Temp 98 F (36.7 C) (Oral)   Ht '5\' 9"'$  (1.753 m)   Wt 182 lb 6.4 oz (82.7 kg)   SpO2 98%   BMI 26.94 kg/m    Physical Examination:   General Appearance: No distress  EYES PERRLA, EOM intact.   NECK Supple, No JVD Pulmonary: normal breath sounds, No wheezing.  CardiovascularNormal S1,S2.  No m/r/g.   Abdomen: Benign, Soft, non-tender. Skin:   warm, no rashes, no ecchymosis  Extremities: normal, no cyanosis, clubbing. Neuro:without focal findings,  speech normal  PSYCHIATRIC: Mood, affect within normal limits.   ALL OTHER ROS ARE NEGATIVE      IMAGING    CT ANGIO CHEST AORTA W/CM & OR WO/CM  Result Date: 10/14/2020 CLINICAL DATA:  Thoracic aortic aneurysm follow-up. EXAM: CT ANGIOGRAPHY CHEST WITH CONTRAST TECHNIQUE: Multidetector CT imaging of the chest was performed using the standard protocol during bolus administration of intravenous contrast. Multiplanar CT image reconstructions and MIPs were obtained to  evaluate the vascular anatomy. CONTRAST:  76m ISOVUE-370 IOPAMIDOL (ISOVUE-370) INJECTION 76% COMPARISON:  CT chest angiogram 10/16/2019 FINDINGS: Cardiovascular: Stable 4.5 cm ascending thoracic aortic aneurysm (series 5, image 82). The descending thoracic aorta is normal in caliber. Three-vessel aortic arch; the  great vessels are patent without stenosis. Heart is normal in size. No abnormal pericardial fluid. Mediastinum/Nodes: No enlarged mediastinal, hilar, or axillary lymph nodes. Thyroid gland, trachea, and esophagus demonstrate no significant findings. Lungs/Pleura: Upper lobe predominant paraseptal emphysema. Azygous lobe. Benign 0.4 cm nodule the anterior left upper lobe (series 7, image 45). Emphysema (ICD10-J43.9). Thin scarring in the lingula and left lower lobe. Upper Abdomen: A 2.0 cm hypoattenuating lesion with peripheral enhancement in the inferior right liver is stable and consistent with benign hemangioma (series 5, image 162). Several other scattered hypodensities are unchanged compared to prior exam, likely benign hepatic cysts. Remainder of the visualized upper abdomen is unremarkable. Musculoskeletal: No chest wall abnormality. No acute or significant osseous findings. Review of the MIP images confirms the above findings. IMPRESSION: 1. Stable 4.5 cm ascending thoracic aortic aneurysm. Aortic aneurysm NOS (ICD10-I71.9). 2.  Emphysema (ICD10-J43.9). Electronically Signed   By: Ileana Roup M.D.   On: 10/14/2020 14:01   ECHOCARDIOGRAM COMPLETE  Result Date: 10/22/2020    ECHOCARDIOGRAM REPORT   Patient Name:   Bobby Flowers [facility] Date of Exam: 10/22/2020 Medical Rec #:  [id number]     Height:       69.0 in Accession #:    [id number]    Weight:       184.0 lb Date of Birth:  08-Jan-1951      BSA:          1.994 m Patient Age:    38 years      BP:           120/70 mmHg Patient Gender: M             HR:           50 bpm. Exam Location:  [place] Procedure: 2D Echo, Color Doppler and Cardiac Doppler  Indications:    R06.00 Dyspnea  History:        Patient has prior history of Echocardiogram examinations, most                 recent 06/22/2017. Arrythmias:Atrial Fibrillation; Risk                 Factors:Former Smoker.  Sonographer:    Charmayne Sheer Referring Phys: [id number] MUHAMMAD A ARIDA  Sonographer Comments: Technically difficult study due to poor echo windows and no subcostal window. IMPRESSIONS  1. Left ventricular ejection fraction, by estimation, is 55 to 60%. The left ventricle has normal function. The left ventricle has no regional wall motion abnormalities. Left ventricular diastolic parameters are consistent with Grade I diastolic dysfunction (impaired relaxation).  2. Right ventricular systolic function is normal. The right ventricular size is normal. Tricuspid regurgitation signal is inadequate for assessing PA pressure.  3. The mitral valve is normal in structure. Mild mitral valve regurgitation. No evidence of mitral stenosis.  4. The aortic valve was not well visualized. Aortic valve regurgitation is mild. FINDINGS  Left Ventricle: Left ventricular ejection fraction, by estimation, is 55 to 60%. The left ventricle has normal function. The left ventricle has no regional wall motion abnormalities. The left ventricular internal cavity size was normal in size. There is  no left ventricular hypertrophy. Left ventricular diastolic parameters are consistent with Grade I diastolic dysfunction (impaired relaxation). Right Ventricle: The right ventricular size is normal. No increase in right ventricular wall thickness. Right ventricular systolic function is normal. Tricuspid regurgitation signal is inadequate for assessing PA pressure. Left Atrium: Left atrial size was normal in size. Right Atrium:  Right atrial size was normal in size. Pericardium: There is no evidence of pericardial effusion. Mitral Valve: The mitral valve is normal in structure. Mild mitral valve regurgitation. No evidence of mitral valve  stenosis. MV peak gradient, 3.5 mmHg. The mean mitral valve gradient is 1.0 mmHg. Tricuspid Valve: The tricuspid valve is normal in structure. Tricuspid valve regurgitation is not demonstrated. No evidence of tricuspid stenosis. Aortic Valve: The aortic valve was not well visualized. Aortic valve regurgitation is mild. No aortic stenosis is present. Aortic valve mean gradient measures 5.0 mmHg. Aortic valve peak gradient measures 10.0 mmHg. Aortic valve area, by VTI measures 1.72 cm. Pulmonic Valve: The pulmonic valve was normal in structure. Pulmonic valve regurgitation is not visualized. No evidence of pulmonic stenosis. Aorta: The aortic root is normal in size and structure. Venous: The inferior vena cava is normal in size with greater than 50% respiratory variability, suggesting right atrial pressure of 3 mmHg. IAS/Shunts: No atrial level shunt detected by color flow Doppler.  LEFT VENTRICLE PLAX 2D LVIDd:         4.90 cm  Diastology LVIDs:         2.80 cm  LV e' lateral:   6.74 cm/s LV PW:         1.10 cm  LV E/e' lateral: 11.4 LV IVS:        0.90 cm LVOT diam:     1.90 cm LV SV:         54 LV SV Index:   27 LVOT Area:     2.84 cm  RIGHT VENTRICLE RV Basal diam:  2.20 cm LEFT ATRIUM             Index       RIGHT ATRIUM           Index LA diam:        2.40 cm 1.20 cm/m  RA Area:     12.00 cm LA Vol (A2C):   67.1 ml 33.65 ml/m RA Volume:   19.60 ml  9.83 ml/m LA Vol (A4C):   47.0 ml 23.57 ml/m LA Biplane Vol: 58.4 ml 29.29 ml/m  AORTIC VALVE                    PULMONIC VALVE AV Area (Vmax):    1.62 cm     PV Vmax:       1.21 m/s AV Area (Vmean):   1.75 cm     PV Vmean:      84.100 cm/s AV Area (VTI):     1.72 cm     PV VTI:        0.214 m AV Vmax:           158.00 cm/s  PV Peak grad:  5.9 mmHg AV Vmean:          106.000 cm/s PV Mean grad:  3.0 mmHg AV VTI:            0.312 m AV Peak Grad:      10.0 mmHg AV Mean Grad:      5.0 mmHg LVOT Vmax:         90.10 cm/s LVOT Vmean:        65.300 cm/s LVOT VTI:           0.189 m LVOT/AV VTI ratio: 0.61  AORTA Ao Root diam: 3.40 cm MITRAL VALVE MV Area (PHT): 3.43 cm     SHUNTS MV Area VTI:   1.97 cm  Systemic VTI:  0.19 m MV Peak grad:  3.5 mmHg     Systemic Diam: 1.90 cm MV Mean grad:  1.0 mmHg MV Vmax:       0.93 m/s MV Vmean:      47.4 cm/s MV Decel Time: 221 msec MV E velocity: 76.70 cm/s MV A velocity: 102.00 cm/s MV E/A ratio:  0.75 Ida Rogue MD Electronically signed by Ida Rogue MD Signature Date/Time: 10/22/2020/5:46:46 PM    Final       ASSESSMENT/PLAN   70 year old pleasant white male seen today for shortness of breath and dyspnea exertion that has been worsening over the last 8 months since his back surgery in the setting of mitral valve prolapse with mild regurgitation with a history of A. fib on amiodarone therapy of 200 mg daily for the last 6 years I was asked to see patient to assess whether his shortness of breath is related to pulmonary toxicity from amiodarone however at this time with a CT of the chest that I reviewed which shows bilateral paraseptal emphysematous changes most likely related to smoking I do not see any evidence of interstitial lung disease from pulmonary toxicity  I have reviewed the CT of the chest with the patient in detail Patient does have an ascending aortic aneurysm as well and will need appropriate follow-up  MEDICATION ADJUSTMENTS/LABS AND TESTS ORDERED: Obtain pulmonary function testing to assess lung function Obtain 6-minute walk test to assess for exertional hypoxia Obtain overnight pulse oximetry to assess for nocturnal hypoxia  Will start Spiriva Respimat 2.5 Avoid secondhand smoke exposure Patient will need repeat CT chest in 1 year  CURRENT MEDICATIONS REVIEWED AT [place]   Patient  satisfied with Plan of action and management. All questions answered  Follow up 3 months  Total Time Spent 49 mins    Corrin Parker, M.D.  [facility]  Medical Director [place] Director [facility] Cardio-Pulmonary Department

## 2020-10-29 NOTE — Addendum Note (Signed)
Addended by: Carlisle Cater on: 10/29/2020 10:16 AM   Modules accepted: Orders

## 2020-10-29 NOTE — Addendum Note (Signed)
Addended by: Willeen Cass A on: 10/29/2020 02:54 PM   Modules accepted: Orders

## 2020-10-29 NOTE — Telephone Encounter (Signed)
Patient is request to cancel procedure & labs. Also wants to continue amiodarone

## 2020-10-29 NOTE — Patient Instructions (Signed)
Obtain pulmonary function testing to assess lung function Obtain 6-minute walk test to assess for exertional hypoxia Obtain overnight pulse oximetry to assess for nocturnal hypoxia  Will start Spiriva Respimat 2.5 Avoid secondhand smoke exposure Patient will need repeat CT chest in 1 year

## 2020-10-29 NOTE — Telephone Encounter (Signed)
Left detailed message for Pt.   Advised ablation cancelled.   Will keep 3 month follow up.   Await further needs.

## 2020-11-04 ENCOUNTER — Other Ambulatory Visit: Payer: Medicare HMO

## 2020-11-13 ENCOUNTER — Telehealth: Payer: Self-pay | Admitting: Internal Medicine

## 2020-11-13 NOTE — Telephone Encounter (Signed)
Lm for patient.  

## 2020-11-16 ENCOUNTER — Telehealth: Payer: Self-pay

## 2020-11-16 MED ORDER — SPIRIVA RESPIMAT 2.5 MCG/ACT IN AERS: 2.0000 | INHALATION_SPRAY | Freq: Every day | RESPIRATORY_TRACT | 11 refills | Status: DC

## 2020-11-16 NOTE — Telephone Encounter (Signed)
Rx for Spiriva has been sent to preferred pharmacy.  Patient is aware and voiced his understanding.  Nothing further needed at this time.

## 2020-11-16 NOTE — Telephone Encounter (Signed)
Patient is aware of date/time of covid test prior to PFT.  

## 2020-11-18 ENCOUNTER — Telehealth: Payer: Self-pay | Admitting: Internal Medicine

## 2020-11-19 ENCOUNTER — Other Ambulatory Visit
Admission: RE | Admit: 2020-11-19 | Discharge: 2020-11-19 | Disposition: A | Discharge status: Home / Self Care | Payer: Medicare HMO | Source: Ambulatory Visit | Attending: Internal Medicine | Admitting: Internal Medicine

## 2020-11-19 ENCOUNTER — Other Ambulatory Visit: Payer: Self-pay

## 2020-11-19 DIAGNOSIS — Z20822 Contact with and (suspected) exposure to covid-19: Secondary | ICD-10-CM | POA: Insufficient documentation

## 2020-11-19 DIAGNOSIS — Z01812 Encounter for preprocedural laboratory examination: Secondary | ICD-10-CM | POA: Diagnosis present

## 2020-11-19 NOTE — Telephone Encounter (Signed)
Spoke to patient, who stated that PA is needed for Spiriva.  ATC [facility] to verify this info. Pharmacy will open at 9:00a. Will call back during business hours.

## 2020-11-20 ENCOUNTER — Ambulatory Visit: Payer: Medicare HMO | Attending: Internal Medicine

## 2020-11-20 DIAGNOSIS — T462X5A Adverse effect of other antidysrhythmic drugs, initial encounter: Secondary | ICD-10-CM | POA: Diagnosis not present

## 2020-11-20 DIAGNOSIS — J984 Other disorders of lung: Secondary | ICD-10-CM | POA: Diagnosis not present

## 2020-11-20 DIAGNOSIS — R0602 Shortness of breath: Secondary | ICD-10-CM

## 2020-11-20 LAB — SARS CORONAVIRUS 2 (TAT 6-24 HRS): SARS Coronavirus 2: NEGATIVE

## 2020-11-23 ENCOUNTER — Telehealth: Payer: Self-pay

## 2020-11-23 MED ORDER — INCRUSE ELLIPTA 62.5 MCG/INH IN AEPB: 1.0000 | INHALATION_SPRAY | Freq: Every day | RESPIRATORY_TRACT | 11 refills | Status: AC

## 2020-11-23 NOTE — Telephone Encounter (Signed)
Rx for Incruse has been sent to preferred pharmacy.  Patient is aware and voiced his understanding.  Nothing further needed at this time.

## 2020-11-23 NOTE — Telephone Encounter (Signed)
Spoke to [place] with [facility] and confirmed that PA is needed.  Katharine Look will fax PA request.

## 2020-11-23 NOTE — Telephone Encounter (Signed)
PA needed for Spirvia. However insurance does cover Incruse is covered. Would you like to send this in?

## 2020-11-23 NOTE — Telephone Encounter (Signed)
Ok to substitute incruse one click each am for spiriva

## 2020-11-23 NOTE — Telephone Encounter (Signed)
Based off of ONO, Per Dr Patsey Berthold patient qualifies for 1L of o2 at night through nasal cannula .Called and spoke to patient about ONO results, patient states he would like to think about it and will give [place] a call back. Will route this to triage to ensure follow up with patient.

## 2020-11-23 NOTE — Telephone Encounter (Signed)
Please see 11/23/2020 phone note.

## 2020-11-23 NOTE — Telephone Encounter (Signed)
Noted  

## 2020-11-25 ENCOUNTER — Telehealth: Payer: Self-pay | Admitting: Pulmonary Disease

## 2020-11-25 DIAGNOSIS — G4734 Idiopathic sleep related nonobstructive alveolar hypoventilation: Secondary | ICD-10-CM

## 2020-11-25 NOTE — Telephone Encounter (Signed)
Spoke to patient and answered all questions.  He would like to proceed with O2.  Order has been placed. Nothing further needed at this time.

## 2020-11-27 ENCOUNTER — Encounter (HOSPITAL_COMMUNITY): Payer: Self-pay

## 2020-11-27 ENCOUNTER — Ambulatory Visit (HOSPITAL_COMMUNITY): Admit: 2020-11-27 | Payer: Medicare HMO | Admitting: Cardiology

## 2020-11-27 SURGERY — ATRIAL FIBRILLATION ABLATION
Anesthesia: General

## 2020-12-07 ENCOUNTER — Telehealth: Payer: Self-pay | Admitting: Pharmacy Technician

## 2020-12-07 NOTE — Telephone Encounter (Signed)
Patient Advocate Encounter  Received notification from [place] that prior authorization for [facility] is required.   PA submitted on 10.17.22 [place]  [id number] Status is pending   [facility] will continue to follow  Bobby Flowers, Bobby Flowers Patient Advocate Specialist Phone: [phone] Fax:  [phone]

## 2020-12-08 NOTE — Telephone Encounter (Signed)
Received notification from [place] regarding a prior authorization for [facility]. Authorization has been APPROVED from 1.1.22 to 12.31.23.

## 2020-12-08 NOTE — Telephone Encounter (Signed)
Not my pt

## 2020-12-30 ENCOUNTER — Ambulatory Visit (HOSPITAL_COMMUNITY): Payer: Medicare HMO | Admitting: Nurse Practitioner

## 2021-01-05 ENCOUNTER — Ambulatory Visit (HOSPITAL_COMMUNITY): Payer: Medicare HMO | Admitting: Nurse Practitioner

## 2021-02-03 ENCOUNTER — Other Ambulatory Visit: Payer: Self-pay | Admitting: Neurosurgery

## 2021-02-03 DIAGNOSIS — G8929 Other chronic pain: Secondary | ICD-10-CM

## 2021-02-19 ENCOUNTER — Other Ambulatory Visit: Payer: Self-pay | Admitting: Orthopedic Surgery

## 2021-02-23 NOTE — Progress Notes (Signed)
Electrophysiology Office Follow up Visit Note:    Date:  02/24/2021   ID:  Bobby Flowers, DOB 26-May-1950, MRN [id number]  PCP:  [name], MD  [facility] Cardiologist:  [name], MD  [facility] Electrophysiologist:  Vickie Epley, MD    Interval History:    Bobby Flowers is a 71 y.o. male who presents for a follow up visit. I last saw the patient 10/21/2020 for persistent AF. He was taking amiodarone at that appointment along with coumadin for stroke prophylaxis. We transitioned him to eliquis and planned for AF ablation. The AF ablation was cancelled and he wished to stay on amiodarone.  Today he tells me he is doing well.  He has a upcoming hip replacement surgery in early February.  He tells me he has not had episodes of atrial fibrillation recently.  He had an eye exam within the last 3 months and everything was stable.  He had PFTs recently that were okay.       Past Medical History:  Diagnosis Date   Chest tightness    a. 05/2017 Cath: nl cors.   Chronic pain of left lower extremity    right   DDD (degenerative disc disease), lumbosacral    Dilated aortic root ([place])    a. 06/2017 Echo: Ao root 4.4cm.   Dysrhythmia    afib   Headache    migraines in past. none for 6-8 yrs.   History of kidney stones    Hypothyroidism    Mitral valve prolapse    a. 09/2015 Echo: EF 55-60%, no rwma, nl LA size, nl RV fxn, nl PASP; b. 06/2017 Echo: EF 55-60%, no rwma, Gr2 DD. Asc Ao 4.4cm. Mild MR (? mild prolapse). Mildly dil LA.   PAF (paroxysmal atrial fibrillation) ([place])    a. Dx in 1980's; b. CHA2DS2VASc = 1 - briefly on coumadin many years ago-->caused rash; c. Chronic Amio 200mg  QD.   Wears contact lenses     Past Surgical History:  Procedure Laterality Date   CARDIAC CATHETERIZATION     COLONOSCOPY  04/25/2005   COLONOSCOPY WITH PROPOFOL N/A 07/22/2015   Procedure: COLONOSCOPY WITH PROPOFOL;  Surgeon: Robert Bellow, MD;  Location: [facility] ENDOSCOPY;  Service:  Endoscopy;  Laterality: N/A;   COLONOSCOPY WITH PROPOFOL N/A 09/04/2020   Procedure: COLONOSCOPY WITH PROPOFOL;  Surgeon: Robert Bellow, MD;  Location: [facility] ENDOSCOPY;  Service: Endoscopy;  Laterality: N/A;   CYSTOSCOPY WITH HOLMIUM LASER LITHOTRIPSY Bilateral 07/28/2017   Procedure: CYSTOSCOPY WITH HOLMIUM LASER LITHOTRIPSY;  Surgeon: Lucas Mallow, MD;  Location: [facility] ORS;  Service: Urology;  Laterality: Bilateral;   CYSTOSCOPY WITH STENT PLACEMENT Bilateral 07/28/2017   Procedure: CYSTOSCOPY WITH STENT PLACEMENT;  Surgeon: Lucas Mallow, MD;  Location: [facility] ORS;  Service: Urology;  Laterality: Bilateral;   CYSTOSCOPY WITH URETEROSCOPY Bilateral 07/28/2017   Procedure: CYSTOSCOPY WITH URETEROSCOPY;  Surgeon: Lucas Mallow, MD;  Location: [facility] ORS;  Service: Urology;  Laterality: Bilateral;   EYE SURGERY Left    cataract surgery   HEMORROIDECTOMY     HERNIA REPAIR Left 05/22/2015   Left inguinal hernia repair with medium Ultra Pro mesh   INGUINAL HERNIA REPAIR Left 07/22/2015   Procedure: HERNIA REPAIR INGUINAL ADULT;  Surgeon: Robert Bellow, MD;  Location: [facility] ORS;  Service: General;  Laterality: Left;   LEFT HEART CATH AND CORONARY ANGIOGRAPHY Left 05/29/2017   Procedure: LEFT HEART CATH AND CORONARY ANGIOGRAPHY;  Surgeon: [name]  A, MD;  Location: [place] CV LAB;  Service: Cardiovascular;  Laterality: Left;   LUMBAR FUSION N/A    LUMBAR LAMINECTOMY/DECOMPRESSION MICRODISCECTOMY Right 07/01/2019   Procedure: OPEN L4 LAMINECTOMY, L3/4 DISCECTOMY ON RIGHT, L4/5 DISCECTOMY;  Surgeon: Deetta Perla, MD;  Location: [facility] ORS;  Service: Neurosurgery;  Laterality: Right;   myringotomy     with tubes x 5.  tubes fall out frequently   MYRINGOTOMY WITH TUBE PLACEMENT Left 12/13/2018   Procedure: MYRINGOTOMY WITH T-TUBE PLACEMENT;  Surgeon: [name], MD;  Location: [place];  Service: ENT;  Laterality: Left;  needs to stay first case vaught /  juengel doing case together   NASAL SINUS SURGERY     NASOPHARYNGOSCOPY EUSTATION TUBE BALLOON DILATION Left 12/13/2018   Procedure: NASOPHARYNGOSCOPY EUSTATION TUBE BALLOON DILATION WITH OUTFRACTURE OF TURBINATES;  Surgeon: [name], MD;  Location: [place];  Service: ENT;  Laterality: Left;   SPINE SURGERY N/A    VASECTOMY      Current Medications: Current Meds  Medication Sig   acetaminophen (TYLENOL) 500 MG tablet Take 500 mg by mouth every 6 (six) hours as needed for moderate pain or headache.   amiodarone (PACERONE) 200 MG tablet TAKE ONE TABLET BY MOUTH DAILY   aspirin 81 MG EC tablet Take 81 mg by mouth daily.   atenolol (TENORMIN) 25 MG tablet TAKE ONE TABLET BY MOUTH DAILY   Carboxymethylcellulose Sodium (ARTIFICIAL TEARS OP) Place 1 drop into both eyes daily.   latanoprost (XALATAN) 0.005 % ophthalmic solution Place 1 drop into both eyes at bedtime.    levothyroxine (SYNTHROID, LEVOTHROID) 75 MCG tablet Take 75 mcg by mouth See admin instructions. Take 75 mcg daily Monday through Saturday, skip Sunday dose   SUMAtriptan (IMITREX) 100 MG tablet Take 100 mg by mouth every 2 (two) hours as needed for migraine. May repeat in 2 hours if headache persists or recurs.     Allergies:   Adhesive [tape] and Coumadin [warfarin]   Social History   Socioeconomic History   Marital status: Married    Spouse name: linda   Number of children: Not on file   Years of education: Not on file   Highest education level: Not on file  Occupational History   Occupation: customer service rep    Employer: [employer]  Tobacco Use   Smoking status: Former    Packs/day: 1.50    Years: 31.00    Pack years: 46.50    Types: Cigarettes    Quit date: 07/14/2000    Years since quitting: 20.6   Smokeless tobacco: Never  Vaping Use   Vaping Use: Never used  Substance and Sexual Activity   Alcohol use: No   Drug use: No   Sexual activity: Not Currently  Other Topics  Concern   Not on file  Social History Narrative   Patient lives with wife.   Social Determinants of Health   Financial Resource Strain: Not on file  Food Insecurity: Not on file  Transportation Needs: Not on file  Physical Activity: Not on file  Stress: Not on file  Social Connections: Not on file     Family History: The patient's family history includes Alcoholism in his brother; Heart failure in his mother; Leukemia in his father.  ROS:   Please see the history of present illness.    All other systems reviewed and are negative.  EKGs/Labs/Other Studies Reviewed:    The following studies were reviewed today:   EKG:  The ekg ordered today demonstrates sinus rhythm.  Recent Labs: No results found for requested labs within last 8760 hours.  Recent Lipid Panel No results found for: CHOL, TRIG, HDL, CHOLHDL, VLDL, LDLCALC, LDLDIRECT  Physical Exam:    VS:  BP 120/80 (BP Location: Left Arm, Patient Position: Sitting, Cuff Size: Normal)    Pulse (!) 55    Ht 5\' 9"  (1.753 m)    Wt 185 lb (83.9 kg)    SpO2 97%    BMI 27.32 kg/m     Wt Readings from Last 3 Encounters:  02/24/21 185 lb (83.9 kg)  10/29/20 182 lb 6.4 oz (82.7 kg)  10/21/20 184 lb (83.5 kg)     GEN:  Well nourished, well developed in no acute distress HEENT: Normal NECK: No JVD; No carotid bruits LYMPHATICS: No lymphadenopathy CARDIAC: RRR, no murmurs, rubs, gallops RESPIRATORY:  Clear to auscultation without rales, wheezing or rhonchi  ABDOMEN: Soft, non-tender, non-distended MUSCULOSKELETAL:  No edema; No deformity  SKIN: Warm and dry NEUROLOGIC:  Alert and oriented x 3 PSYCHIATRIC:  Normal affect        ASSESSMENT:    1. Persistent atrial fibrillation ([place])   2. On amiodarone therapy   3. Encounter for long-term (current) use of high-risk medication    PLAN:    In order of problems listed above:  #Persistent atrial fibrillation #High risk medication monitoring  Maintaining sinus rhythm  on chronic amiodarone.  The patient would like to continue with amiodarone therapy.  We have previously discussed the risks associated with the drug.  During today's appointment we discussed the need for ongoing monitoring of his liver and thyroid function.  We will check a CMP, TSH and free T4.  He is not currently taking anticoagulation.  We discussed his risk of stroke during today's appointment.  His CHA2DS2-VASc is 1.  We discussed how his risk of stroke will increase as he ages or develops other comorbidities.  I do think he continues to be an ablation candidate.  Ablation would offer a mechanism to avoid long-term exposure to amiodarone.  This is previously been discussed with the patient.  We could revisit this question in the future should he decide to discontinue amiodarone.  Follow-up with APP/Dr. Fletcher Anon in 6 months.  He needs CMP, TSH, free T4 every 6 months while on chronic amiodarone therapy.  He needs annual eye exams and PFTs.     Medication Adjustments/Labs and Tests Ordered: Current medicines are reviewed at length with the patient today.  Concerns regarding medicines are outlined above.  No orders of the defined types were placed in this encounter.  No orders of the defined types were placed in this encounter.    Signed, Lars Mage, MD, [facility], [facility] 02/24/2021 9:05 AM    Electrophysiology [facility]

## 2021-02-24 ENCOUNTER — Encounter: Payer: Self-pay | Admitting: Cardiology

## 2021-02-24 ENCOUNTER — Other Ambulatory Visit: Payer: Self-pay

## 2021-02-24 ENCOUNTER — Ambulatory Visit: Payer: Medicare HMO | Admitting: Cardiology

## 2021-02-24 VITALS — BP 120/80 | HR 55 | Ht 69.0 in | Wt 185.0 lb

## 2021-02-24 DIAGNOSIS — Z79899 Other long term (current) drug therapy: Secondary | ICD-10-CM

## 2021-02-24 DIAGNOSIS — I4819 Other persistent atrial fibrillation: Secondary | ICD-10-CM | POA: Diagnosis not present

## 2021-02-24 NOTE — Patient Instructions (Signed)
Your physician recommends that you continue on your current medications as directed. Please refer to the Current Medication list given to you today. *If you need a refill on your cardiac medications before your next appointment, please call your pharmacy*  Lab Work: CMP, Free T4, TSH If you have labs (blood work) drawn today and your tests are completely normal, you will receive your results only by: [place] (if you have MyChart) OR A paper copy in the mail If you have any lab test that is abnormal or we need to change your treatment, we will call you to review the results.  Testing/Procedures: None.  Follow-Up: At [facility], you and your health needs are our priority.  As part of our continuing mission to provide you with exceptional heart care, we have created designated Provider Care Teams.  These Care Teams include your primary Cardiologist (physician) and Advanced Practice Providers (APPs -  Physician Assistants and Nurse Practitioners) who all work together to provide you with the care you need, when you need it.  Your physician wants you to follow-up in: 6 months with Dr. Fletcher Anon or one of the following Advanced Practice Providers on your designated Care Team:    Ignacia Bayley, NP Christell Faith PA Cadence Kathlen Mody PA    You will receive a reminder letter in the mail two months in advance. If you don't receive a letter, please call our office to schedule the follow-up appointment.  We recommend signing up for the patient portal called "MyChart".  Sign up information is provided on this After Visit Summary.  MyChart is used to connect with patients for Virtual Visits (Telemedicine).  Patients are able to view lab/test results, encounter notes, upcoming appointments, etc.  Non-urgent messages can be sent to your provider as well.   To learn more about what you can do with MyChart, go to [email or web address].    Any Other Special Instructions Will Be Listed Below (If Applicable).

## 2021-02-25 LAB — COMPREHENSIVE METABOLIC PANEL
ALT: 20 IU/L (ref 0–44)
AST: 18 IU/L (ref 0–40)
Albumin/Globulin Ratio: 1.9 (ref 1.2–2.2)
Albumin: 4.4 g/dL (ref 3.8–4.8)
Alkaline Phosphatase: 94 IU/L (ref 44–121)
BUN/Creatinine Ratio: 14 (ref 10–24)
BUN: 15 mg/dL (ref 8–27)
Bilirubin Total: 0.9 mg/dL (ref 0.0–1.2)
CO2: 23 mmol/L (ref 20–29)
Calcium: 9.2 mg/dL (ref 8.6–10.2)
Chloride: 104 mmol/L (ref 96–106)
Creatinine, Ser: 1.08 mg/dL (ref 0.76–1.27)
Globulin, Total: 2.3 g/dL (ref 1.5–4.5)
Glucose: 62 mg/dL — ABNORMAL LOW (ref 70–99)
Potassium: 4.3 mmol/L (ref 3.5–5.2)
Sodium: 143 mmol/L (ref 134–144)
Total Protein: 6.7 g/dL (ref 6.0–8.5)
eGFR: 74 mL/min/{1.73_m2} (ref >59)

## 2021-02-25 LAB — T4, FREE: Free T4: 1.64 ng/dL (ref 0.82–1.77)

## 2021-02-25 LAB — TSH: TSH: 7.84 u[IU]/mL — ABNORMAL HIGH (ref 0.450–4.500)

## 2021-02-26 ENCOUNTER — Other Ambulatory Visit: Payer: Self-pay

## 2021-02-26 ENCOUNTER — Encounter: Payer: Self-pay | Admitting: Primary Care

## 2021-02-26 ENCOUNTER — Ambulatory Visit: Payer: Medicare HMO | Admitting: Primary Care

## 2021-02-26 DIAGNOSIS — G4734 Idiopathic sleep related nonobstructive alveolar hypoventilation: Secondary | ICD-10-CM | POA: Diagnosis not present

## 2021-02-26 DIAGNOSIS — J438 Other emphysema: Secondary | ICD-10-CM | POA: Diagnosis not present

## 2021-02-26 MED ORDER — ALBUTEROL SULFATE HFA 108 (90 BASE) MCG/ACT IN AERS: 2.0000 | INHALATION_SPRAY | Freq: Four times a day (QID) | RESPIRATORY_TRACT | 2 refills | Status: DC | PRN

## 2021-02-26 NOTE — Progress Notes (Signed)
@Patient  ID: Bobby Flowers, male    DOB: 05-23-1950, 71 y.o.   MRN: [id number]  Chief Complaint  Patient presents with   Follow-up    Referring provider: [name], MD  HPI: 71 year old male, former smoker. PMH significant for afib, thoracic aortic aneurysm, thyroid disease. Patient of Dr. Mortimer Fries, last seen for initial consult on 10/29/20.   Previous [facility] pulmonary encounter: 71 y.o. male who presents for complaints of SOB H/o persistent atrial fibrillation  and mitral valve prolapse with mild regurgitation.    He has prolonged history of atrial fibrillation which was diagnosed in the 42s.  He was briefly on warfarin but had an allergic reaction with a rash. He has not been on any anticoagulant since then given his low CHADS2 VASc score.    He quit smoking in 2002 and does not consume alcohol. In 2015, patient did not undergo ablation therapy and was given low dose AMIODARONE   He had recurrent atrial fibrillation in 2016 but he converted back to sinus rhythm after increasing the dose of amiodarone.        He had left heart catheterization in 2019 which showed normal coronary arteries with very unusual horizontal orientation of the heart. Echocardiogram 2019 showed normal LV systolic function with moderately dilated ascending aorta at 4.4 cm.   His atrial fibrillation has been well controlled with amiodarone 200 mg once daily. He had back surgery twice last year and feels that he became deconditioned after that.   Since then he has been having DOE and SOB No previous DX of COPD or PNEUMONIA   I was refered By Dr Fletcher Anon to assess if patient has pulmonary toxicity from amiodarone CT chest   CT of the chest from August 2022 reviewed with patient Independent review today 10/29/2020 Bilateral paraseptal cystic changes consistent with emphysema There are several subcentimeter pulmonary nodules bilaterally Follow-up CT chest in 1 year   02/26/2021- Interim hx  Patient presents today  for 3-4 month follow-up. He was seen for original consult in September 2022 for dyspnea symptoms. He has a hx afib on amiodarone x 6 + years. CT chest showed bilateral paraseptal emphysematous changes realted to smoking. He was asked to resume Spiriva Repsimat 2.56mcg, needs repeat CT chest in 1 year (due August 2023).    He is doing well. No worsening respiratory symptoms. He still experiences dyspnea mainly with inclines or when singing. He is compliant with Incruse Ellipta. He does not have a Albuterol rescue inhaler to use when needed.  He is using 1L oxygen at night while sleeping. No witnessed apnea. PFTs showed no significant obstruction, these were done without BD challenge. He is not experiencing wheezing or chest tightness symptoms. He has a rare dry cough. CAT score 10. Needs to schedule 6 minute walk test.    Allergies  Allergen Reactions   Adhesive [Tape] Rash    Paper tape okay   Coumadin [Warfarin] Rash     There is no immunization history on file for this patient.  Past Medical History:  Diagnosis Date   Chest tightness    a. 05/2017 Cath: nl cors.   Chronic pain of left lower extremity    right   COPD (chronic obstructive pulmonary disease) ([facility])    DDD (degenerative disc disease), lumbosacral    Dilated aortic root ([place])    a. 06/2017 Echo: Ao root 4.4cm.   Dysrhythmia    afib   Headache    migraines in past. none  for 6-8 yrs.   History of kidney stones    Hypothyroidism    Mitral valve prolapse    a. 09/2015 Echo: EF 55-60%, no rwma, nl LA size, nl RV fxn, nl PASP; b. 06/2017 Echo: EF 55-60%, no rwma, Gr2 DD. Asc Ao 4.4cm. Mild MR (? mild prolapse). Mildly dil LA.   PAF (paroxysmal atrial fibrillation) ([place])    a. Dx in 1980's; b. CHA2DS2VASc = 1 - briefly on coumadin many years ago-->caused rash; c. Chronic Amio 200mg  QD.   Wears contact lenses     Tobacco History: Social History   Tobacco Use  Smoking Status Former   Packs/day: 1.50   Years: 31.00   Pack  years: 46.50   Types: Cigarettes   Quit date: 07/14/2000   Years since quitting: 20.7  Smokeless Tobacco Never   Counseling given: Not Answered   Outpatient Medications Prior to Visit  Medication Sig Dispense Refill   acetaminophen (TYLENOL) 500 MG tablet Take 500 mg by mouth every 6 (six) hours as needed for moderate pain or headache.     amiodarone (PACERONE) 200 MG tablet TAKE ONE TABLET BY MOUTH DAILY 90 tablet 1   apixaban (ELIQUIS) 5 MG TABS tablet Take 1 tablet (5 mg total) by mouth 2 (two) times daily. (Patient not taking: Reported on 03/03/2021) 60 tablet 6   aspirin 81 MG EC tablet Take 81 mg by mouth daily.     atenolol (TENORMIN) 25 MG tablet TAKE ONE TABLET BY MOUTH DAILY 90 tablet 3   Carboxymethylcellulose Sodium (ARTIFICIAL TEARS OP) Place 1 drop into both eyes daily.     INCRUSE ELLIPTA 62.5 MCG/ACT AEPB Inhale 1 puff into the lungs daily.     latanoprost (XALATAN) 0.005 % ophthalmic solution Place 1 drop into both eyes at bedtime.      levothyroxine (SYNTHROID, LEVOTHROID) 75 MCG tablet Take 75 mcg by mouth See admin instructions. Take 75 mcg daily Monday through Saturday, skip Sunday dose     SUMAtriptan (IMITREX) 100 MG tablet Take 100 mg by mouth every 2 (two) hours as needed for migraine. May repeat in 2 hours if headache persists or recurs.     gabapentin (NEURONTIN) 300 MG capsule Take 300-600 mg by mouth See admin instructions. Take 300 mg in the morning and 600 mg at bedtime  0   naproxen sodium (ALEVE) 220 MG tablet Take 440 mg by mouth daily as needed (pain).     polyethylene glycol powder (GLYCOLAX/MIRALAX) 17 GM/SCOOP powder Take by mouth.     senna-docusate (SENOKOT-S) 8.6-50 MG tablet Take 2 tablets by mouth daily.     No facility-administered medications prior to visit.   Review of Systems  Review of Systems  Constitutional: Negative.   HENT: Negative.    Respiratory:  Negative for cough, chest tightness and wheezing.        DOE with inclines   Cardiovascular: Negative.     Physical Exam  BP 120/80 (BP Location: Left Arm, Patient Position: Sitting, Cuff Size: Normal)    Pulse (!) 53    Temp 98.1 F (36.7 C) (Oral)    Ht 5\' 9"  (1.753 m)    Wt 185 lb 9.6 oz (84.2 kg)    SpO2 99%    BMI 27.41 kg/m  Physical Exam Constitutional:      Appearance: Normal appearance.  HENT:     Head: Normocephalic and atraumatic.     Mouth/Throat:     Comments: Deferred d/t masking Cardiovascular:  Rate and Rhythm: Normal rate and regular rhythm.     Comments: RRR Pulmonary:     Effort: Pulmonary effort is normal.     Breath sounds: Normal breath sounds. No wheezing, rhonchi or rales.     Comments: CTA Musculoskeletal:        General: Normal range of motion.  Skin:    General: Skin is warm and dry.  Neurological:     General: No focal deficit present.     Mental Status: He is alert and oriented to person, place, and time. Mental status is at baseline.     Lab Results:  CBC    Component Value Date/Time   WBC 10.0 03/27/2021 0405   RBC 4.00 (L) 03/27/2021 0405   HGB 12.0 (L) 03/27/2021 0405   HGB 15.1 08/29/2018 1524   HCT 36.3 (L) 03/27/2021 0405   HCT 45.0 08/29/2018 1524   PLT 160 03/27/2021 0405   PLT 174 08/29/2018 1524   MCV 90.8 03/27/2021 0405   MCV 90 08/29/2018 1524   MCV 88 04/20/2013 1611   MCH 30.0 03/27/2021 0405   MCHC 33.1 03/27/2021 0405   RDW 13.0 03/27/2021 0405   RDW 12.9 08/29/2018 1524   RDW 13.8 04/20/2013 1611   LYMPHSABS 1.4 03/12/2021 1012   LYMPHSABS 1.4 04/20/2013 1611   MONOABS 0.5 03/12/2021 1012   MONOABS 0.5 04/20/2013 1611   EOSABS 0.1 03/12/2021 1012   EOSABS 0.1 04/20/2013 1611   BASOSABS 0.1 03/12/2021 1012   BASOSABS 0.0 04/20/2013 1611    BMET    Component Value Date/Time   NA 136 03/27/2021 0405   NA 143 02/24/2021 0923   NA 139 04/20/2013 1611   K 3.6 03/27/2021 0405   K 3.9 04/20/2013 1611   CL 102 03/27/2021 0405   CL 106 04/20/2013 1611   CO2 28 03/27/2021 0405    CO2 31 04/20/2013 1611   GLUCOSE 118 (H) 03/27/2021 0405   GLUCOSE 114 (H) 04/20/2013 1611   BUN 12 03/27/2021 0405   BUN 15 02/24/2021 0923   BUN 22 (H) 04/20/2013 1611   CREATININE 0.88 03/27/2021 0405   CREATININE 1.19 04/20/2013 1611   CALCIUM 7.9 (L) 03/27/2021 0405   CALCIUM 8.4 (L) 04/20/2013 1611   GFRNONAA >60 03/27/2021 0405   GFRNONAA >60 04/20/2013 1611   GFRAA >60 10/14/2019 0853   GFRAA >60 04/20/2013 1611    BNP No results found for: BNP  ProBNP No results found for: PROBNP  Imaging: DG C-Arm 1-60 Min-No Report  Result Date: 03/26/2021 Fluoroscopy was utilized by the requesting physician.  No radiographic interpretation.   DG C-Arm 1-60 Min-No Report  Result Date: 03/26/2021 Fluoroscopy was utilized by the requesting physician.  No radiographic interpretation.   DG HIP UNILAT WITH PELVIS 1V RIGHT  Result Date: 03/26/2021 CLINICAL DATA:  Hip replacement EXAM: DG HIP (WITH OR WITHOUT PELVIS) 1V RIGHT COMPARISON:  None. FINDINGS: Intraoperative images during right hip arthroplasty. 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 Right hip arthroplasty is in normal alignment without evidence of immediate hardware complication. IMPRESSION: Intraoperative images during right hip arthroplasty. No evidence of immediate hardware complication. Electronically Signed   By: Maurine Simmering M.D.   On: 03/26/2021 15:19   DG HIP UNILAT W OR W/O PELVIS 2-3 VIEWS RIGHT  Result Date: 03/26/2021 CLINICAL DATA:  Status post right hip arthroplasty, pain EXAM: DG HIP (WITH OR WITHOUT PELVIS) 2-3V RIGHT COMPARISON:  None. FINDINGS: There is recent right hip arthroplasty.  No fracture is seen. Degenerative changes are noted in the left hip. Skin staples and pockets of air in the soft tissues are noted adjacent to the right hip. There is previous surgical fusion in the lower lumbar spine. IMPRESSION: Right hip arthroplasty. No recent fracture is seen.  Degenerative changes are noted in the left hip. Electronically Signed   By: Elmer Picker M.D.   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 He has a rare dry cough. CAT score 10. CT chest showed bilateral paraseptal emphysematous changes related to smoking. PFTs in September 2022 without obvious airway obstruction. Continue Incruse Ellipta one puff daily. Needs 6MWT.   Nocturnal hypoxia - Continue 1L oxygen at night while sleeping   Martyn Ehrich, NP 03/30/2021

## 2021-02-26 NOTE — Patient Instructions (Addendum)
Nice meeting you today Mr Postiglione  Breathing testing in September showed mild COPD at most, recommend continuing Incruse for emphysema from prior smoking  We will send in Albuterol hfa rescue inhaler, take two puffs every 4-6 hours as needed for shortness of breath  Continue 1L oxygen at night while sleeping   Orders: Needs to schedule 6 minute walk test  Follow-up August with Dr. Mortimer Fries after CT chest

## 2021-03-02 ENCOUNTER — Ambulatory Visit: Payer: Medicare HMO

## 2021-03-03 ENCOUNTER — Ambulatory Visit: Payer: Medicare HMO | Admitting: Cardiology

## 2021-03-12 ENCOUNTER — Other Ambulatory Visit: Payer: Self-pay

## 2021-03-12 ENCOUNTER — Encounter
Admission: RE | Admit: 2021-03-12 | Discharge: 2021-03-12 | Disposition: A | Discharge status: Home / Self Care | Payer: Medicare HMO | Source: Ambulatory Visit | Attending: Orthopedic Surgery | Admitting: Orthopedic Surgery

## 2021-03-12 VITALS — BP 127/80 | HR 53 | Resp 18 | Ht 69.0 in | Wt 185.0 lb

## 2021-03-12 DIAGNOSIS — Z01812 Encounter for preprocedural laboratory examination: Secondary | ICD-10-CM | POA: Diagnosis present

## 2021-03-12 DIAGNOSIS — D696 Thrombocytopenia, unspecified: Secondary | ICD-10-CM | POA: Diagnosis not present

## 2021-03-12 DIAGNOSIS — D649 Anemia, unspecified: Secondary | ICD-10-CM | POA: Insufficient documentation

## 2021-03-12 DIAGNOSIS — Z01818 Encounter for other preprocedural examination: Secondary | ICD-10-CM

## 2021-03-12 HISTORY — DX: Chronic obstructive pulmonary disease, unspecified: J44.9

## 2021-03-12 LAB — CBC WITH DIFFERENTIAL/PLATELET
Abs Immature Granulocytes: 0.05 10*3/uL (ref 0.00–0.07)
Basophils Absolute: 0.1 10*3/uL (ref 0.0–0.1)
Basophils Relative: 1 %
Eosinophils Absolute: 0.1 10*3/uL (ref 0.0–0.5)
Eosinophils Relative: 2 %
HCT: 45.1 % (ref 39.0–52.0)
Hemoglobin: 15 g/dL (ref 13.0–17.0)
Immature Granulocytes: 1 %
Lymphocytes Relative: 18 %
Lymphs Abs: 1.4 10*3/uL (ref 0.7–4.0)
MCH: 30.7 pg (ref 26.0–34.0)
MCHC: 33.3 g/dL (ref 30.0–36.0)
MCV: 92.4 fL (ref 80.0–100.0)
Monocytes Absolute: 0.5 10*3/uL (ref 0.1–1.0)
Monocytes Relative: 7 %
Neutro Abs: 5.6 10*3/uL (ref 1.7–7.7)
Neutrophils Relative %: 71 %
Platelets: 202 10*3/uL (ref 150–400)
RBC: 4.88 MIL/uL (ref 4.22–5.81)
RDW: 13.1 % (ref 11.5–15.5)
WBC: 7.8 10*3/uL (ref 4.0–10.5)
nRBC: 0 % (ref 0.0–0.2)

## 2021-03-12 LAB — URINALYSIS, ROUTINE W REFLEX MICROSCOPIC
Bilirubin Urine: NEGATIVE
Glucose, UA: NEGATIVE mg/dL
Hgb urine dipstick: NEGATIVE
Ketones, ur: NEGATIVE mg/dL
Leukocytes,Ua: NEGATIVE
Nitrite: NEGATIVE
Protein, ur: NEGATIVE mg/dL
Specific Gravity, Urine: 1.023 (ref 1.005–1.030)
pH: 6 (ref 5.0–8.0)

## 2021-03-12 LAB — SURGICAL PCR SCREEN
MRSA, PCR: NEGATIVE
Staphylococcus aureus: NEGATIVE

## 2021-03-12 LAB — TYPE AND SCREEN
ABO/RH(D): A POS
Antibody Screen: NEGATIVE

## 2021-03-12 NOTE — Patient Instructions (Addendum)
Your procedure is scheduled on: 03/26/21 Report to [place]. To find out your arrival time please call [phone] between 1PM - 3PM on 03/25/21.  Remember: Instructions that are not followed completely may result in serious medical risk, up to and including death, or upon the discretion of your surgeon and anesthesiologist your surgery may need to be rescheduled.     _X__ 1. Do not eat food after midnight the night before your procedure.                 No gum chewing or hard candies. You may drink clear liquids up to 2 hours                 before you are scheduled to arrive for your surgery- DO not drink clear                 liquids within 2 hours of the start of your surgery.                 Clear Liquids include:  water, apple juice without pulp, clear carbohydrate                 drink such as Clearfast or Gatorade, Black Coffee or Tea (Do not add                 anything to coffee or tea). Diabetics water only  __X__2.  On the morning of surgery brush your teeth with toothpaste and water, you                 may rinse your mouth with mouthwash if you wish.  Do not swallow any              toothpaste of mouthwash.     _X__ 3.  No Alcohol for 24 hours before or after surgery.   _X__ 4.  Do Not Smoke or use e-cigarettes For 24 Hours Prior to Your Surgery.                 Do not use any chewable tobacco products for at least 6 hours prior to                 surgery.  ____  5.  Bring all medications with you on the day of surgery if instructed.   __X__  6.  Notify your doctor if there is any change in your medical condition      (cold, fever, infections).     Do not wear jewelry, make-up, hairpins, clips or nail polish. Do not wear lotions, powders, or perfumes.  Do not shave body hair 48 hours prior to surgery. Men may shave face and neck. Do not bring valuables to the hospital.    [facility] is not responsible for any  belongings or valuables.  Contacts, dentures/partials or body piercings may not be worn into surgery. Bring a case for your contacts, glasses or hearing aids, a denture cup will be supplied. Leave your suitcase in the car. After surgery it may be brought to your room. For patients admitted to the hospital, discharge time is determined by your treatment team.   Patients discharged the day of surgery will not be allowed to drive home.   Please read over the following fact sheets that you were given:   MRSA Information, CHG soap, Incentive Spirometer, Ensure drink  __X__ Take these medicines the morning of  surgery with A SIP OF WATER:    1. amiodarone (PACERONE) 200 MG tablet  2. atenolol (TENORMIN) 25 MG tablet  3. levothyroxine (SYNTHROID, LEVOTHROID) 75 MCG tablet  4.  5.  6.  ____ Fleet Enema (as directed)   __X__ Use CHG Soap/SAGE wipes as directed  __X__ Use inhalers on the day of surgery Use both the Incruse Ellipta and your rescue (Albuterol) inhalers before arrival  ____ Stop metformin/Janumet/Farxiga 2 days prior to surgery    ____ Take 1/2 of usual insulin dose the night before surgery. No insulin the morning          of surgery.   ____ Stop Blood Thinners Coumadin/Plavix/Xarelto/Pleta/Pradaxa/Eliquis/Effient/Aspirin  on   Or contact your Surgeon, Cardiologist or Medical Doctor regarding  ability to stop your blood thinners  __X__ Stop Anti-inflammatories 7 days before surgery such as Advil, Ibuprofen, Motrin,  BC or Goodies Powder, Naprosyn, Naproxen, Aleve,     __X__ Stop all herbal and supplements, fish oil or vitamins  until after surgery. 7 days Continue Vitamin D  ____ Bring C-Pap to the hospital.    Per Dr Fletcher Anon you may hold Aspirin for 5 days prior to surgery. Restart after surgery

## 2021-03-24 ENCOUNTER — Other Ambulatory Visit: Payer: Self-pay

## 2021-03-24 ENCOUNTER — Other Ambulatory Visit
Admission: RE | Admit: 2021-03-24 | Discharge: 2021-03-24 | Disposition: A | Discharge status: Home / Self Care | Payer: Medicare HMO | Source: Ambulatory Visit | Attending: Orthopedic Surgery | Admitting: Orthopedic Surgery

## 2021-03-24 DIAGNOSIS — Z20822 Contact with and (suspected) exposure to covid-19: Secondary | ICD-10-CM | POA: Insufficient documentation

## 2021-03-24 DIAGNOSIS — Z01812 Encounter for preprocedural laboratory examination: Secondary | ICD-10-CM | POA: Insufficient documentation

## 2021-03-24 LAB — SARS CORONAVIRUS 2 (TAT 6-24 HRS): SARS Coronavirus 2: NEGATIVE

## 2021-03-26 ENCOUNTER — Ambulatory Visit: Payer: Medicare HMO | Admitting: Certified Registered Nurse Anesthetist

## 2021-03-26 ENCOUNTER — Other Ambulatory Visit: Payer: Self-pay

## 2021-03-26 ENCOUNTER — Encounter
Admission: RE | Disposition: A | Discharge status: Home / Self Care | Payer: Self-pay | Source: Ambulatory Visit | Attending: Orthopedic Surgery

## 2021-03-26 ENCOUNTER — Observation Stay
Admission: RE | Admit: 2021-03-26 | Discharge: 2021-03-27 | Disposition: A | Discharge status: Home / Self Care | Payer: Medicare HMO | Source: Ambulatory Visit | Attending: Orthopedic Surgery | Admitting: Orthopedic Surgery

## 2021-03-26 ENCOUNTER — Observation Stay: Payer: Medicare HMO

## 2021-03-26 ENCOUNTER — Ambulatory Visit: Payer: Medicare HMO

## 2021-03-26 ENCOUNTER — Encounter: Payer: Self-pay | Admitting: Orthopedic Surgery

## 2021-03-26 DIAGNOSIS — J449 Chronic obstructive pulmonary disease, unspecified: Secondary | ICD-10-CM | POA: Diagnosis not present

## 2021-03-26 DIAGNOSIS — Z79899 Other long term (current) drug therapy: Secondary | ICD-10-CM | POA: Insufficient documentation

## 2021-03-26 DIAGNOSIS — E039 Hypothyroidism, unspecified: Secondary | ICD-10-CM | POA: Diagnosis not present

## 2021-03-26 DIAGNOSIS — M1611 Unilateral primary osteoarthritis, right hip: Principal | ICD-10-CM | POA: Insufficient documentation

## 2021-03-26 DIAGNOSIS — G8918 Other acute postprocedural pain: Secondary | ICD-10-CM

## 2021-03-26 DIAGNOSIS — Z7982 Long term (current) use of aspirin: Secondary | ICD-10-CM | POA: Insufficient documentation

## 2021-03-26 DIAGNOSIS — Z96641 Presence of right artificial hip joint: Secondary | ICD-10-CM

## 2021-03-26 DIAGNOSIS — Z419 Encounter for procedure for purposes other than remedying health state, unspecified: Secondary | ICD-10-CM

## 2021-03-26 HISTORY — PX: TOTAL HIP ARTHROPLASTY: SHX124

## 2021-03-26 SURGERY — ARTHROPLASTY, HIP, TOTAL, ANTERIOR APPROACH
Anesthesia: Spinal | Site: Hip | Laterality: Right

## 2021-03-26 MED ORDER — NEOMYCIN-POLYMYXIN B GU 40-200000 IR SOLN
Status: AC
  Filled 2021-03-26: qty 4

## 2021-03-26 MED ORDER — ATENOLOL 25 MG PO TABS
25.0000 mg | ORAL_TABLET | Freq: Every day | ORAL | Status: DC
  Administered 2021-03-27: 25 mg via ORAL
  Filled 2021-03-26: qty 1

## 2021-03-26 MED ORDER — METHOCARBAMOL 500 MG PO TABS
500.0000 mg | ORAL_TABLET | Freq: Four times a day (QID) | ORAL | Status: DC | PRN
  Administered 2021-03-26: 500 mg via ORAL
  Filled 2021-03-26: qty 1

## 2021-03-26 MED ORDER — EPHEDRINE SULFATE (PRESSORS) 50 MG/ML IJ SOLN
INTRAMUSCULAR | Status: DC | PRN
  Administered 2021-03-26: 10 mg via INTRAVENOUS
  Administered 2021-03-26: 5 mg via INTRAVENOUS
  Administered 2021-03-26: 7.5 mg via INTRAVENOUS
  Administered 2021-03-26: 10 mg via INTRAVENOUS

## 2021-03-26 MED ORDER — ADULT MULTIVITAMIN W/MINERALS CH
1.0000 | ORAL_TABLET | Freq: Every day | ORAL | Status: DC
  Administered 2021-03-27: 1 via ORAL
  Filled 2021-03-26: qty 1

## 2021-03-26 MED ORDER — HYDROCODONE-ACETAMINOPHEN 5-325 MG PO TABS: 1.0000 | ORAL_TABLET | ORAL | Status: DC | PRN

## 2021-03-26 MED ORDER — ACETAMINOPHEN 325 MG PO TABS: 325.0000 mg | ORAL_TABLET | Freq: Four times a day (QID) | ORAL | Status: DC | PRN

## 2021-03-26 MED ORDER — LATANOPROST 0.005 % OP SOLN
1.0000 [drp] | Freq: Every day | OPHTHALMIC | Status: DC
  Administered 2021-03-26: 1 [drp] via OPHTHALMIC
  Filled 2021-03-26: qty 2.5

## 2021-03-26 MED ORDER — SODIUM CHLORIDE 0.9 % IV SOLN: INTRAVENOUS | Status: DC

## 2021-03-26 MED ORDER — SODIUM CHLORIDE 0.9 % IR SOLN
Status: DC | PRN
  Administered 2021-03-26: 800 mL

## 2021-03-26 MED ORDER — SENNOSIDES-DOCUSATE SODIUM 8.6-50 MG PO TABS
1.0000 | ORAL_TABLET | Freq: Every evening | ORAL | Status: DC | PRN
  Filled 2021-03-26: qty 1

## 2021-03-26 MED ORDER — CEFAZOLIN SODIUM-DEXTROSE 2-4 GM/100ML-% IV SOLN
2.0000 g | INTRAVENOUS | Status: AC
  Administered 2021-03-26: 2 g via INTRAVENOUS

## 2021-03-26 MED ORDER — FENTANYL CITRATE (PF) 100 MCG/2ML IJ SOLN
INTRAMUSCULAR | Status: AC
  Filled 2021-03-26: qty 2

## 2021-03-26 MED ORDER — ONDANSETRON HCL 4 MG/2ML IJ SOLN
INTRAMUSCULAR | Status: DC | PRN
Start: 2021-03-26 — End: 2021-03-26
  Administered 2021-03-26: 4 mg via INTRAVENOUS

## 2021-03-26 MED ORDER — ACETAMINOPHEN 10 MG/ML IV SOLN
INTRAVENOUS | Status: DC | PRN
  Administered 2021-03-26: 1000 mg via INTRAVENOUS

## 2021-03-26 MED ORDER — ACETAMINOPHEN 10 MG/ML IV SOLN
INTRAVENOUS | Status: AC
  Filled 2021-03-26: qty 100

## 2021-03-26 MED ORDER — METOCLOPRAMIDE HCL 5 MG/ML IJ SOLN: 5.0000 mg | Freq: Three times a day (TID) | INTRAMUSCULAR | Status: DC | PRN

## 2021-03-26 MED ORDER — DIPHENHYDRAMINE HCL 12.5 MG/5ML PO ELIX
12.5000 mg | ORAL_SOLUTION | ORAL | Status: DC | PRN
  Filled 2021-03-26: qty 10

## 2021-03-26 MED ORDER — TRAMADOL HCL 50 MG PO TABS
50.0000 mg | ORAL_TABLET | Freq: Four times a day (QID) | ORAL | Status: DC
  Administered 2021-03-26 – 2021-03-27 (×4): 50 mg via ORAL
  Filled 2021-03-26 (×4): qty 1

## 2021-03-26 MED ORDER — DOCUSATE SODIUM 100 MG PO CAPS
100.0000 mg | ORAL_CAPSULE | Freq: Two times a day (BID) | ORAL | Status: DC
  Administered 2021-03-26 – 2021-03-27 (×3): 100 mg via ORAL
  Filled 2021-03-26 (×3): qty 1

## 2021-03-26 MED ORDER — ORAL CARE MOUTH RINSE: 15.0000 mL | Freq: Once | OROMUCOSAL | Status: AC

## 2021-03-26 MED ORDER — SURGIRINSE WOUND IRRIGATION SYSTEM - OPTIME
TOPICAL | Status: DC | PRN
  Administered 2021-03-26: 900 mL

## 2021-03-26 MED ORDER — PROMETHAZINE HCL 25 MG/ML IJ SOLN: 6.2500 mg | INTRAMUSCULAR | Status: DC | PRN

## 2021-03-26 MED ORDER — MENTHOL 3 MG MT LOZG
1.0000 | LOZENGE | OROMUCOSAL | Status: DC | PRN
  Filled 2021-03-26: qty 9

## 2021-03-26 MED ORDER — PANTOPRAZOLE SODIUM 40 MG PO TBEC
40.0000 mg | DELAYED_RELEASE_TABLET | Freq: Every day | ORAL | Status: DC
  Administered 2021-03-27: 40 mg via ORAL
  Filled 2021-03-26: qty 1

## 2021-03-26 MED ORDER — ACETAMINOPHEN 10 MG/ML IV SOLN: 1000.0000 mg | Freq: Once | INTRAVENOUS | Status: DC | PRN

## 2021-03-26 MED ORDER — CEFAZOLIN SODIUM-DEXTROSE 2-4 GM/100ML-% IV SOLN
INTRAVENOUS | Status: AC
  Filled 2021-03-26: qty 100

## 2021-03-26 MED ORDER — LACTATED RINGERS IV SOLN: INTRAVENOUS | Status: DC

## 2021-03-26 MED ORDER — GLYCOPYRROLATE 0.2 MG/ML IJ SOLN
INTRAMUSCULAR | Status: DC | PRN
  Administered 2021-03-26: .2 mg via INTRAVENOUS

## 2021-03-26 MED ORDER — APIXABAN 5 MG PO TABS
5.0000 mg | ORAL_TABLET | Freq: Two times a day (BID) | ORAL | Status: DC
  Administered 2021-03-27: 5 mg via ORAL
  Filled 2021-03-26: qty 1

## 2021-03-26 MED ORDER — PROPOFOL 10 MG/ML IV BOLUS
INTRAVENOUS | Status: DC | PRN
  Administered 2021-03-26 (×2): 40 mg via INTRAVENOUS

## 2021-03-26 MED ORDER — PROPOFOL 10 MG/ML IV BOLUS
INTRAVENOUS | Status: AC
  Filled 2021-03-26: qty 40

## 2021-03-26 MED ORDER — ALBUTEROL SULFATE (2.5 MG/3ML) 0.083% IN NEBU
3.0000 mL | INHALATION_SOLUTION | Freq: Four times a day (QID) | RESPIRATORY_TRACT | Status: DC | PRN
  Filled 2021-03-26: qty 3

## 2021-03-26 MED ORDER — MAGNESIUM HYDROXIDE 400 MG/5ML PO SUSP
30.0000 mL | Freq: Every day | ORAL | Status: DC
  Administered 2021-03-26: 30 mL via ORAL
  Filled 2021-03-26: qty 30

## 2021-03-26 MED ORDER — METHOCARBAMOL 1000 MG/10ML IJ SOLN
500.0000 mg | Freq: Four times a day (QID) | INTRAVENOUS | Status: DC | PRN
  Filled 2021-03-26: qty 5

## 2021-03-26 MED ORDER — MIDAZOLAM HCL 5 MG/5ML IJ SOLN
INTRAMUSCULAR | Status: DC | PRN
  Administered 2021-03-26 (×2): 1 mg via INTRAVENOUS

## 2021-03-26 MED ORDER — UMECLIDINIUM BROMIDE 62.5 MCG/ACT IN AEPB
1.0000 | INHALATION_SPRAY | Freq: Every day | RESPIRATORY_TRACT | Status: DC
  Administered 2021-03-27: 1 via RESPIRATORY_TRACT
  Filled 2021-03-26: qty 7

## 2021-03-26 MED ORDER — CHLORHEXIDINE GLUCONATE 0.12 % MT SOLN: 15.0000 mL | Freq: Once | OROMUCOSAL | Status: AC

## 2021-03-26 MED ORDER — OXYCODONE HCL 5 MG/5ML PO SOLN: 5.0000 mg | Freq: Once | ORAL | Status: DC | PRN

## 2021-03-26 MED ORDER — FENTANYL CITRATE (PF) 100 MCG/2ML IJ SOLN: 25.0000 ug | INTRAMUSCULAR | Status: DC | PRN

## 2021-03-26 MED ORDER — ZOLPIDEM TARTRATE 5 MG PO TABS
5.0000 mg | ORAL_TABLET | Freq: Every evening | ORAL | Status: DC | PRN
  Administered 2021-03-27: 5 mg via ORAL
  Filled 2021-03-26: qty 1

## 2021-03-26 MED ORDER — FENTANYL CITRATE (PF) 100 MCG/2ML IJ SOLN
INTRAMUSCULAR | Status: DC | PRN
Start: 2021-03-26 — End: 2021-03-26
  Administered 2021-03-26 (×2): 25 ug via INTRAVENOUS

## 2021-03-26 MED ORDER — BISACODYL 5 MG PO TBEC
5.0000 mg | DELAYED_RELEASE_TABLET | Freq: Every day | ORAL | Status: DC | PRN
  Filled 2021-03-26: qty 1

## 2021-03-26 MED ORDER — BUPIVACAINE-EPINEPHRINE (PF) 0.25% -1:200000 IJ SOLN
INTRAMUSCULAR | Status: AC
  Filled 2021-03-26: qty 30

## 2021-03-26 MED ORDER — MIDAZOLAM HCL 2 MG/2ML IJ SOLN
INTRAMUSCULAR | Status: AC
  Filled 2021-03-26: qty 2

## 2021-03-26 MED ORDER — PHENYLEPHRINE HCL-NACL 20-0.9 MG/250ML-% IV SOLN
INTRAVENOUS | Status: DC | PRN
  Administered 2021-03-26: 15 ug/min via INTRAVENOUS

## 2021-03-26 MED ORDER — MORPHINE SULFATE (PF) 4 MG/ML IV SOLN: 0.5000 mg | INTRAVENOUS | Status: DC | PRN

## 2021-03-26 MED ORDER — ONDANSETRON HCL 4 MG PO TABS
4.0000 mg | ORAL_TABLET | Freq: Four times a day (QID) | ORAL | Status: DC | PRN
  Filled 2021-03-26: qty 1

## 2021-03-26 MED ORDER — SUMATRIPTAN SUCCINATE 50 MG PO TABS
100.0000 mg | ORAL_TABLET | ORAL | Status: DC | PRN
  Filled 2021-03-26: qty 2

## 2021-03-26 MED ORDER — BUPIVACAINE HCL (PF) 0.5 % IJ SOLN
INTRAMUSCULAR | Status: DC | PRN
  Administered 2021-03-26: 3 mL via INTRATHECAL

## 2021-03-26 MED ORDER — POLYVINYL ALCOHOL 1.4 % OP SOLN
1.0000 [drp] | Freq: Every day | OPHTHALMIC | Status: DC
  Filled 2021-03-26: qty 15

## 2021-03-26 MED ORDER — ALUM & MAG HYDROXIDE-SIMETH 200-200-20 MG/5ML PO SUSP: 30.0000 mL | ORAL | Status: DC | PRN

## 2021-03-26 MED ORDER — BUPIVACAINE LIPOSOME 1.3 % IJ SUSP
INTRAMUSCULAR | Status: AC
  Filled 2021-03-26: qty 20

## 2021-03-26 MED ORDER — SODIUM CHLORIDE FLUSH 0.9 % IV SOLN
INTRAVENOUS | Status: AC
  Filled 2021-03-26: qty 40

## 2021-03-26 MED ORDER — ASPIRIN EC 81 MG PO TBEC
81.0000 mg | DELAYED_RELEASE_TABLET | Freq: Every day | ORAL | Status: DC
  Administered 2021-03-27: 81 mg via ORAL
  Filled 2021-03-26: qty 1

## 2021-03-26 MED ORDER — OXYCODONE HCL 5 MG PO TABS: 5.0000 mg | ORAL_TABLET | Freq: Once | ORAL | Status: DC | PRN

## 2021-03-26 MED ORDER — SODIUM CHLORIDE (PF) 0.9 % IJ SOLN
INTRAMUSCULAR | Status: DC | PRN
  Administered 2021-03-26: 90 mL

## 2021-03-26 MED ORDER — PROPOFOL 500 MG/50ML IV EMUL
INTRAVENOUS | Status: DC | PRN
  Administered 2021-03-26: 75 ug/kg/min via INTRAVENOUS

## 2021-03-26 MED ORDER — AMIODARONE HCL 200 MG PO TABS
200.0000 mg | ORAL_TABLET | Freq: Every day | ORAL | Status: DC
  Administered 2021-03-27: 200 mg via ORAL
  Filled 2021-03-26: qty 1

## 2021-03-26 MED ORDER — CEFAZOLIN SODIUM-DEXTROSE 2-4 GM/100ML-% IV SOLN
2.0000 g | Freq: Four times a day (QID) | INTRAVENOUS | Status: AC
  Administered 2021-03-26 – 2021-03-27 (×2): 2 g via INTRAVENOUS
  Filled 2021-03-26 (×4): qty 100

## 2021-03-26 MED ORDER — ACETAMINOPHEN 500 MG PO TABS: 500.0000 mg | ORAL_TABLET | Freq: Four times a day (QID) | ORAL | Status: DC | PRN

## 2021-03-26 MED ORDER — PHENOL 1.4 % MT LIQD
1.0000 | OROMUCOSAL | Status: DC | PRN
  Filled 2021-03-26: qty 177

## 2021-03-26 MED ORDER — ONDANSETRON HCL 4 MG/2ML IJ SOLN: 4.0000 mg | Freq: Four times a day (QID) | INTRAMUSCULAR | Status: DC | PRN

## 2021-03-26 MED ORDER — LEVOTHYROXINE SODIUM 50 MCG PO TABS
75.0000 ug | ORAL_TABLET | ORAL | Status: DC
  Administered 2021-03-27: 75 ug via ORAL
  Filled 2021-03-26 (×2): qty 1

## 2021-03-26 MED ORDER — FLEET ENEMA 7-19 GM/118ML RE ENEM: 1.0000 | ENEMA | Freq: Once | RECTAL | Status: DC | PRN

## 2021-03-26 MED ORDER — CHLORHEXIDINE GLUCONATE 0.12 % MT SOLN
OROMUCOSAL | Status: AC
  Administered 2021-03-26: 15 mL via OROMUCOSAL
  Filled 2021-03-26: qty 15

## 2021-03-26 MED ORDER — PHENYLEPHRINE HCL (PRESSORS) 10 MG/ML IV SOLN
INTRAVENOUS | Status: DC | PRN
  Administered 2021-03-26: 100 ug via INTRAVENOUS

## 2021-03-26 MED ORDER — HYDROCODONE-ACETAMINOPHEN 7.5-325 MG PO TABS
1.0000 | ORAL_TABLET | ORAL | Status: DC | PRN
  Administered 2021-03-26 – 2021-03-27 (×3): 2 via ORAL
  Filled 2021-03-26 (×4): qty 2

## 2021-03-26 MED ORDER — VITAMIN D 25 MCG (1000 UNIT) PO TABS
1000.0000 [IU] | ORAL_TABLET | Freq: Every day | ORAL | Status: DC
  Administered 2021-03-27: 1000 [IU] via ORAL
  Filled 2021-03-26: qty 1

## 2021-03-26 MED ORDER — METOCLOPRAMIDE HCL 5 MG PO TABS
5.0000 mg | ORAL_TABLET | Freq: Three times a day (TID) | ORAL | Status: DC | PRN
  Filled 2021-03-26: qty 2

## 2021-03-26 SURGICAL SUPPLY — 60 items
BLADE SAGITTAL AGGR TOOTH XLG (BLADE) ×2 IMPLANT
BNDG COHESIVE 6X5 TAN ST LF (GAUZE/BANDAGES/DRESSINGS) ×4 IMPLANT
CANISTER WOUND CARE 500ML ATS (WOUND CARE) ×2 IMPLANT
CHLORAPREP W/TINT 26 (MISCELLANEOUS) ×2 IMPLANT
COVER BACK TABLE REUSABLE LG (DRAPES) ×2 IMPLANT
DRAPE 3/4 80X56 (DRAPES) ×6 IMPLANT
DRAPE C-ARM XRAY 36X54 (DRAPES) ×2 IMPLANT
DRAPE INCISE IOBAN 66X60 STRL (DRAPES) IMPLANT
DRAPE POUCH INSTRU U-SHP 10X18 (DRAPES) ×2 IMPLANT
DRESSING SURGICEL FIBRLLR 1X2 (HEMOSTASIS) ×2 IMPLANT
DRSG MEPILEX SACRM 8.7X9.8 (GAUZE/BANDAGES/DRESSINGS) ×2 IMPLANT
DRSG OPSITE POSTOP 4X8 (GAUZE/BANDAGES/DRESSINGS) ×2 IMPLANT
DRSG SURGICEL FIBRILLAR 1X2 (HEMOSTASIS) ×4
ELECT BLADE 6.5 EXT (BLADE) ×2 IMPLANT
ELECT REM PT RETURN 9FT ADLT (ELECTROSURGICAL) ×2
ELECTRODE REM PT RTRN 9FT ADLT (ELECTROSURGICAL) ×1 IMPLANT
GLOVE SURG SYN 9.0  PF PI (GLOVE) ×2
GLOVE SURG SYN 9.0 PF PI (GLOVE) ×2 IMPLANT
GLOVE SURG UNDER POLY LF SZ9 (GLOVE) ×2 IMPLANT
GOWN SRG 2XL LVL 4 RGLN SLV (GOWNS) ×1 IMPLANT
GOWN STRL NON-REIN 2XL LVL4 (GOWNS) ×1
GOWN STRL REUS W/ TWL LRG LVL3 (GOWN DISPOSABLE) ×1 IMPLANT
GOWN STRL REUS W/TWL LRG LVL3 (GOWN DISPOSABLE) ×1
HEMOVAC 400CC 10FR (MISCELLANEOUS) IMPLANT
HIP FEM HD M 28 (Head) ×1 IMPLANT
HOLDER FOLEY CATH W/STRAP (MISCELLANEOUS) ×2 IMPLANT
KIT PREVENA INCISION MGT 13 (CANNISTER) ×2 IMPLANT
LINER DM 28MM (Liner) ×2 IMPLANT
LINER DM SZH 28X56 (Liner) IMPLANT
MANIFOLD NEPTUNE II (INSTRUMENTS) ×2 IMPLANT
MASTERLOC HIP LATERAL S4 (Hips) ×1 IMPLANT
MAT ABSORB  FLUID 56X50 GRAY (MISCELLANEOUS) ×1
MAT ABSORB FLUID 56X50 GRAY (MISCELLANEOUS) ×1 IMPLANT
NDL SAFETY ECLIPSE 18X1.5 (NEEDLE) ×1 IMPLANT
NDL SPNL 20GX3.5 QUINCKE YW (NEEDLE) ×2 IMPLANT
NEEDLE HYPO 18GX1.5 SHARP (NEEDLE)
NEEDLE SPNL 20GX3.5 QUINCKE YW (NEEDLE) ×4 IMPLANT
NS IRRIG 1000ML POUR BTL (IV SOLUTION) ×2 IMPLANT
PACK HIP COMPR (MISCELLANEOUS) ×2 IMPLANT
SCALPEL PROTECTED #10 DISP (BLADE) ×4 IMPLANT
SHELL ACETABULAR DM  56MM (Shell) ×1 IMPLANT
SOL PREP PVP 2OZ (MISCELLANEOUS)
SOLUTION PREP PVP 2OZ (MISCELLANEOUS) ×1 IMPLANT
SPONGE DRAIN TRACH 4X4 STRL 2S (GAUZE/BANDAGES/DRESSINGS) ×1 IMPLANT
STAPLER SKIN PROX 35W (STAPLE) ×2 IMPLANT
STRAP SAFETY 5IN WIDE (MISCELLANEOUS) ×2 IMPLANT
SUT DVC 2 QUILL PDO  T11 36X36 (SUTURE) ×1
SUT DVC 2 QUILL PDO T11 36X36 (SUTURE) ×1 IMPLANT
SUT SILK 0 (SUTURE) ×1
SUT SILK 0 30XBRD TIE 6 (SUTURE) ×1 IMPLANT
SUT V-LOC 90 ABS DVC 3-0 CL (SUTURE) ×2 IMPLANT
SUT VIC AB 1 CT1 36 (SUTURE) ×2 IMPLANT
SYR 20ML LL LF (SYRINGE) ×1 IMPLANT
SYR 30ML LL (SYRINGE) ×1 IMPLANT
SYR 50ML LL SCALE MARK (SYRINGE) ×4 IMPLANT
SYR BULB IRRIG 60ML STRL (SYRINGE) ×2 IMPLANT
TAPE MICROFOAM 4IN (TAPE) ×1 IMPLANT
TOWEL OR 17X26 4PK STRL BLUE (TOWEL DISPOSABLE) ×2 IMPLANT
TRAY FOLEY MTR SLVR 16FR STAT (SET/KITS/TRAYS/PACK) ×2 IMPLANT
WATER STERILE IRR 1000ML POUR (IV SOLUTION) ×1 IMPLANT

## 2021-03-26 NOTE — Op Note (Signed)
03/26/2021  2:52 PM  PATIENT:  Bobby Flowers  71 y.o. male  PRE-OPERATIVE DIAGNOSIS:  Primary osteoarthritis of right hip M16.11  POST-OPERATIVE DIAGNOSIS:  Primary osteoarthritis of right hip M16.11  PROCEDURE:  Procedure(s): TOTAL HIP ARTHROPLASTY ANTERIOR APPROACH (Right)  SURGEON: Laurene Footman, MD  ASSISTANTS: none  ANESTHESIA:   spinal  EBL:  Total I/O In: 900 [I.V.:700; IV Piggyback:200] Out: -   BLOOD ADMINISTERED:none  DRAINS:  Incisional wound VAC    LOCAL MEDICATIONS USED:  MARCAINE    and OTHER Exparel  SPECIMEN: right femoral head  DISPOSITION OF SPECIMEN:  pathology  COUNTS:  YES  TOURNIQUET:  * No tourniquets in log *  IMPLANTS: Medacta Masterloc 4 lat stem, metal M head, Mpact 56 cup and liner  DICTATION: .Dragon Dictation   The patient was brought to the operating room and after spinal anesthesia was obtained patient was placed on the operative table with the ipsilateral foot into the Medacta attachment, contralateral leg on a well-padded table. C-arm was brought in and preop template x-ray taken. After prepping and draping in usual sterile fashion appropriate patient identification and timeout procedures were completed. Anterior approach to the hip was obtained and centered over the greater trochanter and TFL muscle. The subcutaneous tissue was incised hemostasis being achieved by electrocautery. TFL fascia was incised and the muscle retracted laterally deep retractor placed. The lateral femoral circumflex vessels were identified and ligated. The anterior capsule was exposed and a capsulotomy performed. The neck was identified and a femoral neck cut carried out with a saw. The head was removed without difficulty and showed sclerotic femoral head and acetabulum. Reaming was carried out to 56 mm and a 56 mm cup trial gave appropriate tightness to the acetabular component a 56 DM cup was impacted into position. The leg was then externally rotated and ischiofemoral  and pubofemoral releases carried out. The femur was sequentially broached to a size 4, size 4 lat with S right now and M head trials were placed and the final components chosen. The 4 lab stem was inserted along with a metal M 28 mm head and 56 mm liner. The hip was reduced and was stable the wound was thoroughly irrigated with fibrillar placed along the posterior capsule and medial neck. The deep fascia ws closed using a heavy Quill after infiltration of 30 cc of quarter percent Sensorcaine with epinephrine mixed with Exparel throughout the case .3-0 V-loc to close the skin with skin staples.  Incisional wound VAC applied and patient was sent to recovery in stable condition.   PLAN OF CARE: Admit for overnight observation

## 2021-03-26 NOTE — Anesthesia Procedure Notes (Signed)
Spinal  Patient location during procedure: OR Start time: 03/26/2021 1:00 PM End time: 03/26/2021 1:07 PM Reason for block: surgical anesthesia Staffing Performed: resident/CRNA  Anesthesiologist: Iran Ouch, MD Resident/CRNA: Lowry Bowl, CRNA Preanesthetic Checklist Completed: patient identified, IV checked, site marked, risks and benefits discussed, surgical consent, monitors and equipment checked, pre-op evaluation and timeout performed Spinal Block Patient position: sitting Prep: DuraPrep Patient monitoring: heart rate, cardiac monitor, continuous pulse ox and blood pressure Approach: midline Location: L3-4 Injection technique: single-shot Needle Needle type: Sprotte  Needle gauge: 24 G Needle length: 9 cm Assessment Sensory level: T4 Events: CSF return

## 2021-03-26 NOTE — Anesthesia Preprocedure Evaluation (Addendum)
Anesthesia Evaluation  Patient identified by MRN, date of birth, ID band Patient awake    Reviewed: Allergy & Precautions, NPO status , Patient's Chart, lab work & pertinent test results, reviewed documented beta blocker date and time   Airway Mallampati: II  TM Distance: >3 FB Neck ROM: full    Dental  (+) Missing,    Pulmonary shortness of breath and with exertion, COPD,  COPD inhaler, former smoker,  Normal PFTs   Pulmonary exam normal        Cardiovascular Exercise Tolerance: Good + DOE  + dysrhythmias (pAF) Atrial Fibrillation + Valvular Problems/Murmurs MVP   Thoracic aortic aneurysm without rupture Stable 4.5 cm ascending thoracic aortic aneurysm. 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 05/2017 Cath: nl cors. No date: Chronic pain of left lower extremity     Comment:  right No date: COPD (chronic obstructive pulmonary disease) (HCC) No date: DDD (degenerative disc disease), lumbosacral No date: Dilated aortic root ([place])     Comment:  a. 06/2017 Echo: Ao root 4.4cm. No date: Dysrhythmia     Comment:  afib No date: Headache     Comment:  migraines in past. none for 6-8 yrs. No date: History of kidney stones No date: Hypothyroidism No date: Mitral valve prolapse     Comment:  a. 09/2015 Echo: EF 55-60%, no rwma, nl LA size, nl RV                fxn, nl PASP; b. 06/2017 Echo: EF 55-60%, no rwma, Gr2 DD.              Asc Ao 4.4cm. Mild MR (? mild prolapse). Mildly dil LA. No date: PAF (paroxysmal atrial fibrillation) (HCC)     Comment:  a. Dx in 1980's; b. CHA2DS2VASc = 1 - briefly on               coumadin many years ago-->caused rash; c. Chronic Amio               200mg  QD. No date: Wears contact lenses  Past Surgical History: No date: CARDIAC CATHETERIZATION 04/25/2005: COLONOSCOPY 07/22/2015: COLONOSCOPY WITH PROPOFOL; N/A     Comment:  Procedure: COLONOSCOPY WITH PROPOFOL;  Surgeon: Robert Bellow, MD;  Location: [facility];  Service:               Endoscopy;  Laterality: N/A; 09/04/2020: COLONOSCOPY WITH PROPOFOL; N/A     Comment:  Procedure: COLONOSCOPY WITH PROPOFOL;  Surgeon: Robert Bellow, MD;  Location: [facility];  Service:               Endoscopy;  Laterality: N/A; 07/28/2017: CYSTOSCOPY WITH HOLMIUM LASER LITHOTRIPSY; Bilateral     Comment:  Procedure: CYSTOSCOPY WITH HOLMIUM LASER  LITHOTRIPSY;                Surgeon: Lucas Mallow, MD;  Location: [facility];                Service: Urology;  Laterality: Bilateral; 07/28/2017: CYSTOSCOPY WITH STENT PLACEMENT; Bilateral     Comment:  Procedure: [place];  Surgeon:               Lucas Mallow, MD;  Location: [facility];  Service:               Urology;  Laterality: Bilateral; 07/28/2017: CYSTOSCOPY WITH URETEROSCOPY; Bilateral     Comment:  Procedure: CYSTOSCOPY WITH URETEROSCOPY;  Surgeon: Lucas Mallow, MD;  Location: [facility];  Service: Urology;              Laterality: Bilateral; No date: EYE SURGERY; Left     Comment:  cataract surgery No date: HEMORROIDECTOMY 05/22/2015: HERNIA REPAIR; Left     Comment:  Left inguinal hernia repair with medium Ultra Pro mesh 07/22/2015: INGUINAL HERNIA REPAIR; Left     Comment:  Procedure: HERNIA REPAIR INGUINAL ADULT;   Surgeon:               Robert Bellow, MD;  Location: [facility];  Service:               General;  Laterality: Left; 05/29/2017: LEFT HEART CATH AND CORONARY ANGIOGRAPHY; Left     Comment:  Procedure: LEFT HEART CATH AND CORONARY ANGIOGRAPHY;                Surgeon: [name], MD;  Location: [place]               CV [facility];  Service: Cardiovascular;  Laterality: Left; No date: LUMBAR FUSION; N/A 07/01/2019: LUMBAR LAMINECTOMY/DECOMPRESSION MICRODISCECTOMY; Right     Comment:  Procedure: OPEN L4 LAMINECTOMY, L3/4 DISCECTOMY ON               RIGHT, L4/5 DISCECTOMY;  Surgeon: Deetta Perla, MD;                Location: [facility];  Service: Neurosurgery;  Laterality:               Right; No date: myringotomy     Comment:  with tubes x 5.  tubes fall out frequently 12/13/2018: MYRINGOTOMY WITH TUBE PLACEMENT; Left     Comment:  Procedure: MYRINGOTOMY WITH T-TUBE PLACEMENT;  Surgeon:               [name], MD;  Location: [facility];                Service: ENT;  Laterality: Left;  needs to stay first               case vaught / juengel doing case together No date: NASAL SINUS SURGERY 12/13/2018: NASOPHARYNGOSCOPY EUSTATION TUBE BALLOON DILATION; Left     Comment:  Procedure: NASOPHARYNGOSCOPY EUSTATION TUBE BALLOON               DILATION WITH OUTFRACTURE OF TURBINATES;  Surgeon:               [name], MD;  Location: [facility];                Service: ENT;  Laterality: Left; No date: SPINE SURGERY; N/A No date: VASECTOMY     Reproductive/Obstetrics negative OB ROS                           Anesthesia Physical Anesthesia Plan  ASA: 2  Anesthesia Plan: Spinal   Post-op Pain Management: Ofirmev IV (intra-op) and Regional block   Induction:   PONV Risk Score and Plan:   Airway Management Planned: Natural Airway and Nasal Cannula  Additional Equipment:   Intra-op Plan:   Post-operative Plan:   Informed  Consent: I have reviewed the patients History and Physical, chart, labs and discussed the procedure including the risks, benefits and alternatives for the proposed anesthesia with the patient or authorized representative who has indicated his/her understanding and acceptance.     Dental Advisory Given  Plan Discussed with: Anesthesiologist and CRNA  Anesthesia Plan Comments:       Anesthesia Quick Evaluation

## 2021-03-26 NOTE — Progress Notes (Signed)
PT Cancellation Note  Patient Details Name: Bobby Flowers MRN: [id number] DOB: Jul 19, 1950   Cancelled Treatment:    Reason Eval/Treat Not Completed: Patient not medically ready.  PT consult received.  Chart reviewed.  S/p R THA anterior approach with spinal anesthesia.  Pt does not have adequate strength or sensation in B LE's to safely participate in physical therapy at this time.  Will re-attempt PT evaluation tomorrow.  Leitha Bleak, PT 03/26/21, 4:50 PM

## 2021-03-26 NOTE — Transfer of Care (Signed)
Immediate Anesthesia Transfer of Care Note  Patient: Bobby Flowers  Procedure(s) Performed: TOTAL HIP ARTHROPLASTY ANTERIOR APPROACH (Right: Hip)  Patient Location: PACU  Anesthesia Type:Spinal  Level of Consciousness: awake, drowsy and patient cooperative  Airway & Oxygen Therapy: Patient Spontanous Breathing  Post-op Assessment: Report given to RN and Post -op Vital signs reviewed and stable  Post vital signs: Reviewed and stable  Last Vitals:  Vitals Value Taken Time  BP 112/60 03/26/21 1449  Temp    Pulse 48 03/26/21 1454  Resp 17 03/26/21 1454  SpO2 100 % 03/26/21 1454  Vitals shown include unvalidated device data.  Last Pain:  Vitals:   03/26/21 1201  TempSrc: Oral  PainSc: 4          Complications: No notable events documented.

## 2021-03-26 NOTE — H&P (Signed)
Chief Complaint  Patient presents with   Pre-op Exam  Scheduled for Right THA 03/26/21 with Dr. Rudene Christians    History of the Present Illness: Bobby Flowers is a 71 y.o. male here today for history and physical for right total hip arthroplasty with Dr. Hessie Knows on 03/26/2021. Patient has had years of right hip pain that has progressively increased. Has a hard time standing, walking. He is unable to walk at a fast pace. He has a hard time getting in and out of his low vehicle and lifting his leg to put on his socks and shoes. Patient's pain is located on the lateral hip and groin and thigh. His pain is moderate to severe. Patient is seen Dr. Rudene Christians and agreed and consented to a right total hip arthroplasty  The patient has atrial fibrillation, and he takes Pacerone and an inhaler. He states he was concerned about being on amiodarone for so long because he started having breathing problems, and he was put on Eliquis. He went to a pulmonologist once and he was told he did not see any amiodarone damage, and he just saw COPD. He took himself off the Eliquis and went back to the aspirin. His cardiologist is Dr. Fletcher Anon. He states he has around 5 steps to enter his home and a handrail.  The patient states he lives in [place]. The patient is employed as a Administrator.  I have reviewed past medical, surgical, social and family history, and allergies as documented in the EMR.  Past Medical History: Past Medical History:  Diagnosis Date   Arrhythmia afib   Atrial fibrillation (CMS-HCC) 1998   Chronic pain of left lower extremity   DDD (degenerative disc disease), lumbosacral   Dilated aortic root (CMS-HCC) 06/2017   Glaucoma (increased eye pressure)   Hemorrhoid   History of kidney stones   Hyperlipidemia   Migraine   MVP (mitral valve prolapse)   Thyroid disease  hypothyroidism   Vision abnormalities   Past Surgical History: Past Surgical History:  Procedure Laterality Date   HEMORRHOIDECTOMY BY  SIMPLE LIGATION 1996   COLONOSCOPY 04/25/2005  Hyperplastic Polyp: CBF 04/2015; Recall Ltr mailed 03/06/2015 (dw)   INGUINAL HERNIA REPAIR Left 05/22/2015   INGUINAL HERNIA REPAIR Left 07/22/2015   COLONOSCOPY 07/22/2015   cardiac cath and coronary angiography Left 05/29/2017   CYSTOSCOPY W/ URETEROSCOPY 07/28/2017  stent placement, holmium laser lithotripsy   Right L3/4 Hemilaminectomy, Lateral recess decompression, Right L4/5 Hemilaminectomy and Discectomy 07/01/2019  Dr Deetta Perla at [facility]   TRANSFORAMINAL LUMBAR INTERBODY FUSION MINIMALLY INVASIVE N/A 10/31/2019  Procedure: L4-5 TLIF; Surgeon: Malen Gauze, MD; Location: [place]; Service: Neurosurgery; Laterality: N/A;   INSERTION INTERBODY BIOMECHANICAL DEVICE W/ INSTRUMENTATION N/A 10/31/2019  Procedure: INSERTION INTERBODY BIOMECHANICAL DEVICE WITH INTEGRAL ANTERIOR INSTRUMENTATION, TO INTERVERTEBRAL DISC SPACE IN CONJUNCTION INTERBODY ARTHRODESIS, EACH INTERSPACE; Surgeon: Malen Gauze, MD; Location: [place]; Service: Neurosurgery; Laterality: N/A;   INSTRUMENTATION NON-SEGMENTAL POSTERIOR SPINE N/A 10/31/2019  Procedure: INSTRUMENTATION POSTERIOR SPINE 1/2 VERTEBRAL SEGMENTS W/O FIXATION ADDITIONAL FIFTH; Surgeon: Malen Gauze, MD; Location: [place]; Service: Neurosurgery; Laterality: N/A;   INTRAOPERATIVE FLUOROSCOPY N/A 10/31/2019  Procedure: FLUOROSCOPY; Surgeon: Malen Gauze, MD; Location: [place]; Service: Neurosurgery; Laterality: N/A;   COLONOSCOPY 09/04/2020  Normal colon/PHx CP/Repeat 108yrs/JWB   BACK SURGERY  x2   CATARACT EXTRACTION Left   ear surgery Left   VASECTOMY   Past Family History: Family History  Problem Relation Age of Onset   Heart failure Mother  Congestive heart failure   Cancer Father  leukemia   Alcohol abuse Brother   Cancer Maternal Grandfather   Cancer Paternal Grandmother   Alcohol abuse Other  ALCOHOLISM   Diabetes Neg Hx   Glaucoma Neg Hx   High blood pressure  (Hypertension) Neg Hx   Macular degeneration Neg Hx   Blindness Neg Hx   Medications: Current Outpatient Medications Ordered in Epic  Medication Sig Dispense Refill   aspirin 81 MG EC tablet Take 1 tablet (81 mg total) by mouth once daily   atenolol (TENORMIN) 100 MG tablet Take 100 mg by mouth once daily Taking 1/2 tablet daily   INCRUSE ELLIPTA 62.5 mcg/actuation DsDv inhalation unit 1 inhalation every morning   latanoprost (XALATAN) 0.005 % ophthalmic solution INSTILL ONE DROP INTO BOTH EYES EVERY EVENING 7.5 mL 11   levothyroxine (SYNTHROID) 75 MCG tablet Take 1 tablet (75 mcg total) by mouth once daily Take on an empty stomach with a glass of water at least 30-60 minutes before breakfast. 90 tablet 3   naproxen sodium (ALEVE) 220 MG tablet Take 2 tablets (440 mg total) by mouth as needed for Pain   PACERONE 200 mg tablet TAKE 1/2 TABLET BY MOUTH ONCE A DAY 15 tablet 0   No current Epic-ordered facility-administered medications on file.   Allergies: Allergies  Allergen Reactions   Adhesive Tape-Silicones Rash   Warfarin Rash    Body mass index is 26.66 kg/m.  Review of Systems: A comprehensive 14 point ROS was performed, reviewed, and the pertinent orthopaedic findings are documented in the HPI.  Vitals:  03/10/21 0859  BP: 136/80    General Physical Examination:   General:  Well developed, well nourished, no apparent distress, normal affect, antalgic gait with no assistive devices  HEENT: Head normocephalic, atraumatic, PERRL.   Abdomen: Soft, non tender, non distended, Bowel sounds present.  Heart: Examination of the heart reveals regular, rate, and rhythm. There is no murmur noted on ascultation. There is a normal apical pulse.  Lungs: Lungs are clear to auscultation. There is no wheeze, rhonchi, or crackles. There is normal expansion of bilateral chest walls.   Right hip: On exam, left hip has 20 degrees internal rotation and 40 degrees external rotation.  Right hip has 0 degrees internal rotation and 20 degrees external rotation. Good great toe range of motion. Sensation is intact.  Radiographs:  The patient previously had x-rays on 01/19/2021 that were obtained by Neurosurgery, that showed severe bilateral hip osteoarthritis, complete loss of superior joint space, extensive osteophytes completely surrounding the femoral head, and minimal bone loss of the acetabulum.  Assessment: ICD-10-CM  1. Primary osteoarthritis of right hip M16.76   Plan: 71 year old male with advanced right hip osteoarthritis. Pains been severe and debilitating and interfering with activities of daily living. No relief with conservative treatment. Patient's right hip is very stiff and painful on exam. Patient is seen Dr. Rudene Christians, risk, benefits, complications of a right total hip arthroplasty have been discussed the patient. Patient has agreed and consented procedure with Dr. Hessie Knows on 03/26/2021   Electronically signed by Feliberto Gottron, PA at 03/10/2021 9:23 AM EST  Reviewed  H+P. No changes noted.

## 2021-03-27 DIAGNOSIS — M1611 Unilateral primary osteoarthritis, right hip: Secondary | ICD-10-CM | POA: Diagnosis not present

## 2021-03-27 LAB — BASIC METABOLIC PANEL
Anion gap: 6 (ref 5–15)
BUN: 12 mg/dL (ref 8–23)
CO2: 28 mmol/L (ref 22–32)
Calcium: 7.9 mg/dL — ABNORMAL LOW (ref 8.9–10.3)
Chloride: 102 mmol/L (ref 98–111)
Creatinine, Ser: 0.88 mg/dL (ref 0.61–1.24)
GFR, Estimated: >60 mL/min (ref >60)
Glucose, Bld: 118 mg/dL — ABNORMAL HIGH (ref 70–99)
Potassium: 3.6 mmol/L (ref 3.5–5.1)
Sodium: 136 mmol/L (ref 135–145)

## 2021-03-27 LAB — CBC
HCT: 36.3 % — ABNORMAL LOW (ref 39.0–52.0)
Hemoglobin: 12 g/dL — ABNORMAL LOW (ref 13.0–17.0)
MCH: 30 pg (ref 26.0–34.0)
MCHC: 33.1 g/dL (ref 30.0–36.0)
MCV: 90.8 fL (ref 80.0–100.0)
Platelets: 160 10*3/uL (ref 150–400)
RBC: 4 MIL/uL — ABNORMAL LOW (ref 4.22–5.81)
RDW: 13 % (ref 11.5–15.5)
WBC: 10 10*3/uL (ref 4.0–10.5)
nRBC: 0 % (ref 0.0–0.2)

## 2021-03-27 MED ORDER — METHOCARBAMOL 500 MG PO TABS: 500.0000 mg | ORAL_TABLET | Freq: Four times a day (QID) | ORAL | 0 refills | Status: DC | PRN

## 2021-03-27 MED ORDER — ONDANSETRON HCL 4 MG PO TABS: 4.0000 mg | ORAL_TABLET | Freq: Four times a day (QID) | ORAL | 0 refills | Status: DC | PRN

## 2021-03-27 MED ORDER — TRAMADOL HCL 50 MG PO TABS: 50.0000 mg | ORAL_TABLET | Freq: Four times a day (QID) | ORAL | 0 refills | Status: DC | PRN

## 2021-03-27 MED ORDER — HYDROCODONE-ACETAMINOPHEN 7.5-325 MG PO TABS: 1.0000 | ORAL_TABLET | ORAL | 0 refills | Status: DC | PRN

## 2021-03-27 MED ORDER — DOCUSATE SODIUM 100 MG PO CAPS: 100.0000 mg | ORAL_CAPSULE | Freq: Two times a day (BID) | ORAL | 0 refills | Status: DC

## 2021-03-27 NOTE — TOC Progression Note (Signed)
Transition of Care [facility]) - Progression Note    Patient Details  Name: Bobby Flowers MRN: [id number] Date of Birth: [birth year]  Transition of Care [facility]) CM/SW [place], [place] Phone Number: [phone] 03/27/2021, 11:32 AM  Clinical Narrative:      Confirmed w/ [place] at [place], pt active.  No PT/OT assessment yet.      Expected Discharge Plan and Services                                                 Social Determinants of Health (SDOH) Interventions    Readmission Risk Interventions No flowsheet data found.

## 2021-03-27 NOTE — Evaluation (Signed)
Physical Therapy Evaluation Patient Details Name: Bobby Flowers MRN: [id number] DOB: 08-03-1950 Today's Date: 03/27/2021  History of Present Illness  Pt is a 71 yo M diagnosed with osteoarthritis of right hip and is s/p elective R THA. PMH includes COPD, DOE, A-fib, DDD, and lumbar fusion.   Clinical Impression  Pt was pleasant and motivated to participate during the session and put forth good effort throughout. Pt required cues for sequencing and extra time and effort with bed mobility and transfers but no physical assistance. Pt was able to amb 125' with a RW without overt LOB but with very slow, step-to gait pattern that did gradually progress to beginning step-through pattern.  Pt's SpO2 and HR were both WNL during the session on room air. Pt will benefit from HHPT upon discharge to safely address deficits listed in patient problem list for decreased caregiver assistance and eventual return to PLOF.         Recommendations for follow up therapy are one component of a multi-disciplinary discharge planning process, led by the attending physician.  Recommendations may be updated based on patient status, additional functional criteria and insurance authorization.  Follow Up Recommendations Home health PT    Assistance Recommended at Discharge Frequent or constant Supervision/Assistance  Patient can return home with the following  A little help with walking and/or transfers;A little help with bathing/dressing/bathroom;Assistance with cooking/housework;Assist for transportation;Help with stairs or ramp for entrance    Equipment Recommendations None recommended by PT (Pt believes he has access to both a BSC and a RW)  Recommendations for Other Services       Functional Status Assessment Patient has had a recent decline in their functional status and demonstrates the ability to make significant improvements in function in a reasonable and predictable amount of time.     Precautions /  Restrictions Precautions Precautions: Fall;Anterior Hip Precaution Booklet Issued: Yes (comment) Restrictions Weight Bearing Restrictions: Yes RLE Weight Bearing: Weight bearing as tolerated      Mobility  Bed Mobility Overal bed mobility: Modified Independent             General bed mobility comments: Extra time and effort only    Transfers Overall transfer level: Needs assistance Equipment used: Rolling walker (2 wheels) Transfers: Sit to/from Stand Sit to Stand: Min guard           General transfer comment: Min verbal cues for sequencing with fair eccentric and concentric control    Ambulation/Gait Ambulation/Gait assistance: Min guard Gait Distance (Feet): 125 Feet Assistive device: Rolling walker (2 wheels) Gait Pattern/deviations: Step-to pattern, Step-through pattern, Antalgic, Decreased stance time - right Gait velocity: decreased     General Gait Details: Very slow cadence with step-to pattern slowly progressing to beginning step-through pattern with decreased LLE step length. Steady throughout with no LOB or buckling  Stairs            Wheelchair Mobility    Modified Rankin (Stroke Patients Only)       Balance Overall balance assessment: Needs assistance   Sitting balance-Leahy Scale: Good     Standing balance support: Bilateral upper extremity supported, During functional activity Standing balance-Leahy Scale: Fair                               Pertinent Vitals/Pain Pain Assessment Pain Assessment: 0-10 Pain Score: 5  Pain Location: R hip Pain Descriptors / Indicators: Aching, Sore Pain Intervention(s): Repositioned, Premedicated before  session, Monitored during session    Home Living Family/patient expects to be discharged to:: Private residence Living Arrangements: Spouse/significant other Available Help at Discharge: Family;Available 24 hours/day Type of Home: House Home Access: Stairs to enter Entrance  Stairs-Rails: Psychiatric nurse of Steps: 4 steps with B wide rails and then 1 step with no rails   Home Layout: One level Home Equipment: Conservation officer, nature (2 wheels);BSC/3in1      Prior Function Prior Level of Function : Independent/Modified Independent             Mobility Comments: Ind amb without an AD community distances, no fall history ADLs Comments: Ind with ADLs     Hand Dominance        Extremity/Trunk Assessment   Upper Extremity Assessment Upper Extremity Assessment: Overall WFL for tasks assessed    Lower Extremity Assessment Lower Extremity Assessment: Generalized weakness;RLE deficits/detail RLE: Unable to fully assess due to pain RLE Sensation: WNL       Communication   Communication: No difficulties  Cognition Arousal/Alertness: Awake/alert Behavior During Therapy: WFL for tasks assessed/performed Overall Cognitive Status: Within Functional Limits for tasks assessed                                          General Comments      Exercises Total Joint Exercises Long Arc Quad: AROM, Strengthening, Both, 5 reps, 10 reps Knee Flexion: AROM, Strengthening, Both, 5 reps, 10 reps Marching in Standing: AROM, Strengthening, Both, 5 reps, Standing Other Exercises Other Exercises: Anterior hip precaution education Other Exercises: HEP education per handout   Assessment/Plan    PT Assessment Patient needs continued PT services  PT Problem List Decreased strength;Decreased activity tolerance;Decreased balance;Decreased mobility;Decreased knowledge of use of DME;Decreased knowledge of precautions;Pain       PT Treatment Interventions DME instruction;Gait training;Stair training;Functional mobility training;Therapeutic activities;Therapeutic exercise;Balance training;Patient/family education    PT Goals (Current goals can be found in the Care Plan section)  Acute Rehab PT Goals Patient Stated Goal: To be able to jog  again PT Goal Formulation: With patient Time For Goal Achievement: 04/09/21 Potential to Achieve Goals: Fair    Frequency BID     Co-evaluation               AM-PAC PT "6 Clicks" Mobility  Outcome Measure Help needed turning from your back to your side while in a flat bed without using bedrails?: A Little Help needed moving from lying on your back to sitting on the side of a flat bed without using bedrails?: A Little Help needed moving to and from a bed to a chair (including a wheelchair)?: A Little Help needed standing up from a chair using your arms (e.g., wheelchair or bedside chair)?: A Little Help needed to walk in hospital room?: A Little Help needed climbing 3-5 steps with a railing? : A Lot 6 Click Score: 17    End of Session Equipment Utilized During Treatment: Gait belt Activity Tolerance: Patient tolerated treatment well Patient left: in chair;with call bell/phone within reach;with chair alarm set;with SCD's reapplied;with family/visitor present Nurse Communication: Mobility status;Weight bearing status;Other (comment) (Pt needed ice bag filled) PT Visit Diagnosis: Other abnormalities of gait and mobility (R26.89);Muscle weakness (generalized) (M62.81);Pain Pain - Right/Left: Right Pain - part of body: Hip    Time: 1660-6301 PT Time Calculation (min) (ACUTE ONLY): 46 min   Charges:  PT Evaluation $PT Eval Moderate Complexity: 1 Mod PT Treatments $Gait Training: 8-22 mins $Therapeutic Activity: 8-22 mins       D. Royetta Asal PT, DPT 03/27/21, 12:05 PM

## 2021-03-27 NOTE — Plan of Care (Signed)
°  Problem: Education: Goal: Knowledge of the prescribed therapeutic regimen will improve Outcome: Progressing   Problem: Pain Management: Goal: Pain level will decrease with appropriate interventions Outcome: Progressing   Problem: Skin Integrity: Goal: Will show signs of wound healing Outcome: Progressing   

## 2021-03-27 NOTE — Progress Notes (Signed)
Physical Therapy Treatment Patient Details Name: Bobby Flowers MRN: [id number] DOB: 11-04-1950 Today's Date: 03/27/2021   History of Present Illness Pt is a 71 yo M diagnosed with osteoarthritis of right hip and is s/p elective R THA. PMH includes COPD, DOE, A-fib, DDD, and lumbar fusion.    PT Comments    Pt was sitting in recliner upon arriving. He agrees to session and was cooperative throughout. He reports feeling/"doing better than I was."  He was able to stand and ambulate with RW 200 ft without LOB or safety concern. Perform stairs without difficulty. Spouse arrived at conclusion of session. Author answered all questions. Pt requested to DC home this date. MD/PA made aware. Recommend DC home with HHPT to follow.    Recommendations for follow up therapy are one component of a multi-disciplinary discharge planning process, led by the attending physician.  Recommendations may be updated based on patient status, additional functional criteria and insurance authorization.  Follow Up Recommendations  Home health PT     Assistance Recommended at Discharge Frequent or constant Supervision/Assistance  Patient can return home with the following A little help with walking and/or transfers;A little help with bathing/dressing/bathroom;Assistance with cooking/housework;Assist for transportation;Help with stairs or ramp for entrance   Equipment Recommendations  None recommended by PT       Precautions / Restrictions Precautions Precautions: Fall;Anterior Hip Precaution Booklet Issued: Yes (comment) Restrictions Weight Bearing Restrictions: Yes RLE Weight Bearing: Weight bearing as tolerated     Mobility  Bed Mobility    General bed mobility comments: in recliner pre/post session    Transfers Overall transfer level: Needs assistance Equipment used: Rolling walker (2 wheels) Transfers: Sit to/from Stand Sit to Stand: Supervision     Ambulation/Gait Ambulation/Gait assistance:  Supervision Gait Distance (Feet): 200 Feet Assistive device: Rolling walker (2 wheels) Gait Pattern/deviations: Step-through pattern Gait velocity: decreased     General Gait Details: pt demonstrated safe ability to ambulate 200 ft without LOB or safety concern.   Stairs Stairs: Yes Stairs assistance: Supervision Stair Management: One rail Right, Step to pattern, Sideways Number of Stairs: 4 General stair comments: pt was easily and safely able to ascend/descend 4 stair. Reports confidence in abilities     Balance Overall balance assessment: Needs assistance   Sitting balance-Leahy Scale: Good     Standing balance support: Bilateral upper extremity supported, During functional activity, Reliant on assistive device for balance Standing balance-Leahy Scale: Good       Cognition Arousal/Alertness: Awake/alert Behavior During Therapy: WFL for tasks assessed/performed Overall Cognitive Status: Within Functional Limits for tasks assessed        General Comments: alert and oriented x4, good safety awareness           General Comments General comments (skin integrity, edema, etc.): reviewed wound vac, ice, HEP and what to expect at DC. plan is to DC home this afternoon with      Pertinent Vitals/Pain Pain Assessment Pain Assessment: 0-10 Pain Score: 2  Pain Location: R hip Pain Descriptors / Indicators: Aching Pain Intervention(s): Limited activity within patient's tolerance, Monitored during session, Premedicated before session, Repositioned    Home Living Family/patient expects to be discharged to:: Private residence Living Arrangements: Spouse/significant other Available Help at Discharge: Family;Available 24 hours/day Type of Home: House Home Access: Stairs to enter Entrance Stairs-Rails: Psychiatric nurse of Steps: 5 steps with rails on both sides, 1 step with no rails   Home Layout: One level Home Equipment: Conservation officer, nature (2  wheels);BSC/3in1          PT Goals (current goals can now be found in the care plan section) Acute Rehab PT Goals Patient Stated Goal: To be able to jog again PT Goal Formulation: With patient Time For Goal Achievement: 04/09/21 Potential to Achieve Goals: Fair Progress towards PT goals: Progressing toward goals    Frequency    BID      PT Plan Current plan remains appropriate       AM-PAC PT "6 Clicks" Mobility   Outcome Measure  Help needed turning from your back to your side while in a flat bed without using bedrails?: A Little Help needed moving from lying on your back to sitting on the side of a flat bed without using bedrails?: A Little Help needed moving to and from a bed to a chair (including a wheelchair)?: A Little Help needed standing up from a chair using your arms (e.g., wheelchair or bedside chair)?: A Little Help needed to walk in hospital room?: A Little Help needed climbing 3-5 steps with a railing? : A Lot 6 Click Score: 17    End of Session Equipment Utilized During Treatment: Gait belt Activity Tolerance: Patient tolerated treatment well Patient left: in chair;with call bell/phone within reach;with chair alarm set;with SCD's reapplied;with family/visitor present Nurse Communication: Mobility status;Weight bearing status;Other (comment) (Pt needed ice bag filled) PT Visit Diagnosis: Other abnormalities of gait and mobility (R26.89);Muscle weakness (generalized) (M62.81);Pain Pain - Right/Left: Right Pain - part of body: Hip     Time: 1356-1420 PT Time Calculation (min) (ACUTE ONLY): 24 min  Charges:  $Gait Training: 8-22 mins $Therapeutic Activity: 8-22 mins          Julaine Fusi PTA 03/27/21, 2:44 PM

## 2021-03-27 NOTE — Evaluation (Signed)
Occupational Therapy Evaluation Patient Details Name: Bobby Flowers MRN: [id number] DOB: 10-19-1950 Today's Date: 03/27/2021   History of Present Illness Pt is a 71 yo M diagnosed with osteoarthritis of right hip and is s/p elective R THA. PMH includes COPD, DOE, A-fib, DDD, and lumbar fusion.   Clinical Impression   Pt seen for OT evaluation this date, POD#1 from above surgery. Pt was independent in all ADLs prior to surgery, with increased time due to R hip pain. Pt is eager to return to PLOF with less pain and improved safety and independence. Pt currently requires MIN A for  RLE LB dressing while in seated position due to pain and limited AROM of R hip with hip kit; MAX A without hip kit. Pt performs LLE LB dressing with SUP. All other ADL completed in standing with SUP. Intermittent vcs required for safe use of RW/transfer technique. Pt educated re: self care skills, falls prevention strategies, home/routines modifications, DME/AE for LB bathing and dressing tasks (as needed). Pt adheres to all precautions throughout. Pt would benefit from additional instruction in self care skills and techniques to help maintain precautions with or without assistive devices to support recall and carryover prior to discharge. No OT recommended following discharge. Recommend use of 3 in 1 BSC and shower chair. Pt is left as received in bedside chair, NAD, all needs met. OT will continue to follow acutely.      Recommendations for follow up therapy are one component of a multi-disciplinary discharge planning process, led by the attending physician.  Recommendations may be updated based on patient status, additional functional criteria and insurance authorization.   Follow Up Recommendations  No OT follow up    Assistance Recommended at Discharge Set up Supervision/Assistance  Patient can return home with the following Assistance with cooking/housework;Assist for transportation    Functional Status Assessment   Patient has had a recent decline in their functional status and demonstrates the ability to make significant improvements in function in a reasonable and predictable amount of time.  Equipment Recommendations  Tub/shower bench    Recommendations for Other Services       Precautions / Restrictions Precautions Precautions: Fall;Anterior Hip Precaution Booklet Issued: Yes (comment) Restrictions Weight Bearing Restrictions: Yes RLE Weight Bearing: Weight bearing as tolerated      Mobility Bed Mobility               General bed mobility comments: pt in chair at start of session    Transfers Overall transfer level: Needs assistance Equipment used: Rolling walker (2 wheels) Transfers: Sit to/from Stand Sit to Stand: Supervision           General transfer comment: 1 vc required for technique      Balance Overall balance assessment: Needs assistance   Sitting balance-Leahy Scale: Good     Standing balance support: Bilateral upper extremity supported, During functional activity Standing balance-Leahy Scale: Fair                             ADL either performed or assessed with clinical judgement   ADL Overall ADL's : Needs assistance/impaired Eating/Feeding: Set up   Grooming: Wash/dry hands;Wash/dry face;Oral care;Standing;Supervision/safety Grooming Details (indicate cue type and reason): close SUP             Lower Body Dressing: Minimal assistance;Supervision/safety Lower Body Dressing Details (indicate cue type and reason): SUP for L sock, MIN A with sock aid for  R side; MAX A without sock aid Toilet Transfer: Copy Details (indicate cue type and reason): simulated Toileting- Clothing Manipulation and Hygiene: Supervision/safety       Functional mobility during ADLs: Supervision/safety;Rolling walker (2 wheels)       Vision Baseline Vision/History: 1 Wears glasses       Perception     Praxis       Pertinent Vitals/Pain Pain Assessment Pain Assessment: 0-10 Pain Score: 5  Pain Location: R hip Pain Descriptors / Indicators: Aching Pain Intervention(s): Limited activity within patient's tolerance, Repositioned, Monitored during session     Hand Dominance     Extremity/Trunk Assessment Upper Extremity Assessment Upper Extremity Assessment: Overall WFL for tasks assessed   Lower Extremity Assessment Lower Extremity Assessment: Generalized weakness;RLE deficits/detail RLE Deficits / Details: s/p R hip replacement RLE: Unable to fully assess due to pain RLE Sensation: WNL       Communication Communication Communication: No difficulties   Cognition Arousal/Alertness: Awake/alert Behavior During Therapy: WFL for tasks assessed/performed Overall Cognitive Status: Within Functional Limits for tasks assessed                                 General Comments: alert and oriented x4, good safety awareness     General Comments  pt is +wound vac, IV    Exercises     Shoulder Instructions      Home Living Family/patient expects to be discharged to:: Private residence Living Arrangements: Spouse/significant other Available Help at Discharge: Family;Available 24 hours/day Type of Home: House Home Access: Stairs to enter [employer] of Steps: 5 steps with rails on both sides, 1 step with no rails Entrance Stairs-Rails: Right;Left Home Layout: One level     Bathroom Shower/Tub: Occupational psychologist: Standard     Home Equipment: Conservation officer, nature (2 wheels);BSC/3in1          Prior Functioning/Environment Prior Level of Function : Independent/Modified Independent             Mobility Comments: MOD I-I without AD (and increased time) ADLs Comments: MOD I- I in all ADL/IADL;  works as a Nurse, mental health Problem List: Decreased strength;Decreased activity tolerance;Decreased knowledge of use of DME or AE;Decreased knowledge  of precautions      OT Treatment/Interventions: Self-care/ADL training;Balance training;Therapeutic exercise;DME and/or AE instruction;Patient/family education;Therapeutic activities    OT Goals(Current goals can be found in the care plan section) Acute Rehab OT Goals Patient Stated Goal: to go home OT Goal Formulation: With patient Time For Goal Achievement: 04/10/21 Potential to Achieve Goals: Good  OT Frequency: Min 2X/week    Co-evaluation              AM-PAC OT "6 Clicks" Daily Activity     Outcome Measure Help from another person eating meals?: None Help from another person taking care of personal grooming?: None Help from another person toileting, which includes using toliet, bedpan, or urinal?: None Help from another person bathing (including washing, rinsing, drying)?: A Little Help from another person to put on and taking off regular upper body clothing?: None Help from another person to put on and taking off regular lower body clothing?: A Little 6 Click Score: 22   End of Session Equipment Utilized During Treatment: Gait belt;Rolling walker (2 wheels) Nurse Communication: Mobility status  Activity Tolerance: Patient tolerated treatment well Patient left: in chair;with  call bell/phone within reach  OT Visit Diagnosis: Unsteadiness on feet (R26.81);Other abnormalities of gait and mobility (R26.89)                Time: 4068-4033 OT Time Calculation (min): 21 min Charges:  OT General Charges $OT Visit: 1 Visit OT Evaluation $OT Eval Low Complexity: 1 Low  Shanon Payor, OTD OTR/L  03/27/21, 12:26 PM

## 2021-03-27 NOTE — Progress Notes (Signed)
° °  Subjective: 1 Day Post-Op Procedure(s) (LRB): TOTAL HIP ARTHROPLASTY ANTERIOR APPROACH (Right) Patient reports pain as moderate.   Patient is well, and has had no acute complaints or problems Denies any CP, SOB, ABD pain. We will continue therapy today.  Plan is to go Home after hospital stay.  Objective: Vital signs in last 24 hours: Temp:  [96.8 F (36 C)-98 F (36.7 C)] 98 F (36.7 C) (02/04 0754) Pulse Rate:  [46-72] 62 (02/04 0754) Resp:  [12-19] 16 (02/04 0754) BP: (102-151)/(60-91) 110/64 (02/04 0754) SpO2:  [98 %-100 %] 98 % (02/04 0754) Weight:  [84.3 kg] 84.3 kg (02/03 1201)  Intake/Output from previous day: 02/03 0701 - 02/04 0700 In: 2785.2 [P.O.:370; I.V.:2215.2; IV Piggyback:200] Out: 705 [Urine:630; Blood:75] Intake/Output this shift: No intake/output data recorded.  Recent Labs    03/27/21 0405  HGB 12.0*   Recent Labs    03/27/21 0405  WBC 10.0  RBC 4.00*  HCT 36.3*  PLT 160   Recent Labs    03/27/21 0405  NA 136  K 3.6  CL 102  CO2 28  BUN 12  CREATININE 0.88  GLUCOSE 118*  CALCIUM 7.9*   No results for input(s): LABPT, INR in the last 72 hours.  EXAM General - Patient is Alert, Appropriate, and Oriented Extremity - Neurovascular intact Sensation intact distally Intact pulses distally Dorsiflexion/Plantar flexion intact Dressing - dressing C/D/I and no drainage, Praveena intact without drainage Motor Function - intact, moving foot and toes well on exam.   Past Medical History:  Diagnosis Date   Chest tightness    a. 05/2017 Cath: nl cors.   Chronic pain of left lower extremity    right   COPD (chronic obstructive pulmonary disease) (HCC)    DDD (degenerative disc disease), lumbosacral    Dilated aortic root ([place])    a. 06/2017 Echo: Ao root 4.4cm.   Dysrhythmia    afib   Headache    migraines in past. none for 6-8 yrs.   History of kidney stones    Hypothyroidism    Mitral valve prolapse    a. 09/2015 Echo: EF 55-60%,  no rwma, nl LA size, nl RV fxn, nl PASP; b. 06/2017 Echo: EF 55-60%, no rwma, Gr2 DD. Asc Ao 4.4cm. Mild MR (? mild prolapse). Mildly dil LA.   PAF (paroxysmal atrial fibrillation) ([place])    a. Dx in 1980's; b. CHA2DS2VASc = 1 - briefly on coumadin many years ago-->caused rash; c. Chronic Amio 200mg  QD.   Wears contact lenses     Assessment/Plan:   1 Day Post-Op Procedure(s) (LRB): TOTAL HIP ARTHROPLASTY ANTERIOR APPROACH (Right) Principal Problem:   Status post total hip replacement, right  Estimated body mass index is 27.44 kg/m as calculated from the following:   Height as of this encounter: 5\' 9"  (1.753 m).   Weight as of this encounter: 84.3 kg. Advance diet Up with therapy Pain controlled Vital signs are stable Labs are stable Patient doing well, unable to start physical therapy yesterday.  We will start PT today.  Anticipate discharge to home with home health PT today pending completion of PT goals  DVT Prophylaxis - TED hose and SCDs, Eliquis, aspirin Weight-Bearing as tolerated to right leg   T. Rachelle Hora, PA-C [place] 03/27/2021, 8:28 AM

## 2021-03-27 NOTE — Discharge Instructions (Signed)
ANTERIOR APPROACH TOTAL HIP REPLACEMENT POSTOPERATIVE DIRECTIONS   Hip Rehabilitation, Guidelines Following Surgery  The results of a hip operation are greatly improved after range of motion and muscle strengthening exercises. Follow all safety measures which are given to protect your hip. If any of these exercises cause increased pain or swelling in your joint, decrease the amount until you are comfortable again. Then slowly increase the exercises. Call your caregiver if you have problems or questions.   HOME CARE INSTRUCTIONS  Remove items at home which could result in a fall. This includes throw rugs or furniture in walking pathways.  ICE to the affected hip every three hours for 30 minutes at a time and then as needed for pain and swelling.  Continue to use ice on the hip for pain and swelling from surgery. You may notice swelling that will progress down to the foot and ankle.  This is normal after surgery.  Elevate the leg when you are not up walking on it.   Continue to use the breathing machine which will help keep your temperature down.  It is common for your temperature to cycle up and down following surgery, especially at night when you are not up moving around and exerting yourself.  The breathing machine keeps your lungs expanded and your temperature down. Do not place pillow under knee, focus on keeping the knee straight while resting  DIET You may resume your previous home diet once your are discharged from the hospital.  DRESSING / WOUND CARE / SHOWERING Please remove provena negative pressure dressing on 04/03/2021 and apply honey comb dressing. Keep dressing clean and dry at all times.   ACTIVITY Walk with your walker as instructed. Use walker as long as suggested by your caregivers. Avoid periods of inactivity such as sitting longer than an hour when not asleep. This helps prevent blood clots.  You may resume a sexual relationship in one month or when given the OK by your  doctor.  You may return to work once you are cleared by your doctor.  Do not drive a car for 6 weeks or until released by you surgeon.  Do not drive while taking narcotics.  WEIGHT BEARING Weight bearing as tolerated. Use walker/cane as needed for at least 4 weeks post op.  POSTOPERATIVE CONSTIPATION PROTOCOL Constipation - defined medically as fewer than three stools per week and severe constipation as less than one stool per week.  One of the most common issues patients have following surgery is constipation.  Even if you have a regular bowel pattern at home, your normal regimen is likely to be disrupted due to multiple reasons following surgery.  Combination of anesthesia, postoperative narcotics, change in appetite and fluid intake all can affect your bowels.  In order to avoid complications following surgery, here are some recommendations in order to help you during your recovery period.  Colace (docusate) - Pick up an over-the-counter form of Colace or another stool softener and take twice a day as long as you are requiring postoperative pain medications.  Take with a full glass of water daily.  If you experience loose stools or diarrhea, hold the colace until you stool forms back up.  If your symptoms do not get better within 1 week or if they get worse, check with your doctor.  Dulcolax (bisacodyl) - Pick up over-the-counter and take as directed by the product packaging as needed to assist with the movement of your bowels.  Take with a full glass of  water.  Use this product as needed if not relieved by Colace only.  ° °MiraLax (polyethylene glycol) - Pick up over-the-counter to have on hand.  MiraLax is a solution that will increase the amount of water in your bowels to assist with bowel movements.  Take as directed and can mix with a glass of water, juice, soda, coffee, or tea.  Take if you go more than two days without a movement. °Do not use MiraLax more than once per day. Call your doctor  if you are still constipated or irregular after using this medication for 7 days in a row. ° °If you continue to have problems with postoperative constipation, please contact the office for further assistance and recommendations.  If you experience "the worst abdominal pain ever" or develop nausea or vomiting, please contact the office immediatly for further recommendations for treatment. ° °ITCHING ° If you experience itching with your medications, try taking only a single pain pill, or even half a pain pill at a time.  You can also use Benadryl over the counter for itching or also to help with sleep.  ° °TED HOSE STOCKINGS °Wear the elastic stockings on both legs for six weeks following surgery during the day but you may remove then at night for sleeping. ° °MEDICATIONS °See your medication summary on the “After Visit Summary” that the nursing staff will review with you prior to discharge.  You may have some home medications which will be placed on hold until you complete the course of blood thinner medication.  It is important for you to complete the blood thinner medication as prescribed by your surgeon.  Continue your approved medications as instructed at time of discharge. ° °PRECAUTIONS °If you experience chest pain or shortness of breath - call 911 immediately for transfer to the hospital emergency department.  °If you develop a fever greater that 101 F, purulent drainage from wound, increased redness or drainage from wound, foul odor from the wound/dressing, or calf pain - CONTACT YOUR SURGEON.   °                                                °FOLLOW-UP APPOINTMENTS °Make sure you keep all of your appointments after your operation with your surgeon and caregivers. You should call the office at the above phone number and make an appointment for approximately two weeks after the date of your surgery or on the date instructed by your surgeon outlined in the "After Visit Summary". ° °RANGE OF MOTION AND  STRENGTHENING EXERCISES  °These exercises are designed to help you keep full movement of your hip joint. Follow your caregiver's or physical therapist's instructions. Perform all exercises about fifteen times, three times per day or as directed. Exercise both hips, even if you have had only one joint replacement. These exercises can be done on a training (exercise) mat, on the floor, on a table or on a bed. Use whatever works the best and is most comfortable for you. Use music or television while you are exercising so that the exercises are a pleasant break in your day. This will make your life better with the exercises acting as a break in routine you can look forward to.  °Lying on your back, slowly slide your foot toward your buttocks, raising your knee up off the floor. Then slowly slide your   foot back down until your leg is straight again.  °Lying on your back spread your legs as far apart as you can without causing discomfort.  °Lying on your side, raise your upper leg and foot straight up from the floor as far as is comfortable. Slowly lower the leg and repeat.  °Lying on your back, tighten up the muscle in the front of your thigh (quadriceps muscles). You can do this by keeping your leg straight and trying to raise your heel off the floor. This helps strengthen the largest muscle supporting your knee.  °Lying on your back, tighten up the muscles of your buttocks both with the legs straight and with the knee bent at a comfortable angle while keeping your heel on the floor.  ° °IF YOU ARE TRANSFERRED TO A SKILLED REHAB FACILITY °If the patient is transferred to a skilled rehab facility following release from the hospital, a list of the current medications will be sent to the facility for the patient to continue.  When discharged from the skilled rehab facility, please have the facility set up the patient's Home Health Physical Therapy prior to being released. Also, the skilled facility will be responsible for  providing the patient with their medications at time of release from the facility to include their pain medication, the muscle relaxants, and their blood thinner medication. If the patient is still at the rehab facility at time of the two week follow up appointment, the skilled rehab facility will also need to assist the patient in arranging follow up appointment in our office and any transportation needs. ° °MAKE SURE YOU:  °Understand these instructions.  °Get help right away if you are not doing well or get worse.  ° ° °Pick up stool softner and laxative for home use following surgery while on pain medications. °Continue to use ice for pain and swelling after surgery. °Do not use any lotions or creams on the incision until instructed by your surgeon. ° °

## 2021-03-27 NOTE — Discharge Summary (Signed)
Physician Discharge Summary  Patient ID: Bobby Flowers MRN: [id number] DOB/AGE: August 29, 1950 71 y.o.  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, L4-5) 09/24/2019   Abnormal MRI, lumbar spine (09/06/2019) 09/24/2019   Lumbar Grade 1 Anterolisthesis of L3/L4 and L4/L5 (6 mm) 09/24/2019   Bertolotti's syndrome (Right) 09/24/2019   Lumbar Levoscoliosis 09/24/2019   Kidney cyst (12 mm) (Right) 09/24/2019   Lumbar facet hypertrophy (Multilevel) (L1-2, L2-3, L3-4, L4-5) (Bilateral) 09/24/2019   DDD (degenerative disc disease), lumbosacral 09/24/2019   Displacement of lumbar intervertebral disc (Protrusion: T12-L1, L2-3, L4-5, L5-S1) (Bulging: 12,23, L3-4, L4-5, and L5-S1) 09/24/2019   Lumbosacral lateral recess stenosis (Right: L2-3, L3-4) (Left: L5-S1) (Bilateral: L4-5) 09/24/2019   Lumbar foraminal stenosis (Right: L3-4) (Left: L5-S1) (Bilateral: L4-5) 09/24/2019   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) 09/24/2019   Chronic pain syndrome 09/23/2019   Pharmacologic therapy 09/23/2019   Disorder of skeletal system 09/23/2019   Problems influencing health status 09/23/2019   Nephrolithiasis 11/09/2018   Migraine 04/17/2018   Thyroid disease 04/17/2018   Ureteral calculus 07/28/2017   Unstable angina (HCC)    Age-related nuclear cataract of both eyes 12/14/2016   Primary open angle glaucoma (POAG) of both eyes, moderate stage 12/14/2016   Persistent atrial fibrillation ([place]) 08/17/2016   Mitral valve prolapse 08/17/2016   Left inguinal hernia  07/11/2015   Encounter for screening colonoscopy 07/01/2015   Avulsion fracture 03/15/2015   Tendonitis 03/15/2015    Past Medical History:  Diagnosis Date   Chest tightness    a. 05/2017 Cath: nl cors.   Chronic pain of left lower extremity    right   COPD (chronic obstructive pulmonary disease) (HCC)    DDD (degenerative disc disease), lumbosacral    Dilated aortic root ([place])    a. 06/2017 Echo: Ao root 4.4cm.   Dysrhythmia    afib   Headache    migraines in past. none for 6-8 yrs.   History of kidney stones    Hypothyroidism    Mitral valve prolapse    a. 09/2015 Echo: EF 55-60%, no rwma, nl LA size, nl RV fxn, nl PASP; b. 06/2017 Echo: EF 55-60%, no rwma, Gr2 DD. Asc Ao 4.4cm. Mild MR (? mild prolapse). Mildly dil LA.   PAF (paroxysmal atrial fibrillation) ([place])    a. Dx in 1980's; b. CHA2DS2VASc = 1 - briefly on coumadin many years ago-->caused rash; c. Chronic Amio 200mg  QD.   Wears contact lenses      Transfusion: none   Consultants (if any):   Discharged Condition: Improved  Hospital Course: Bobby Flowers is an 71 y.o. male who was admitted 03/26/2021 with a diagnosis of Status post total hip replacement, right and went to the operating room on 03/26/2021 and underwent the above named procedures.    Surgeries: Procedure(s): TOTAL HIP ARTHROPLASTY ANTERIOR APPROACH on 03/26/2021 Patient tolerated the surgery well. Taken to PACU where she was stabilized and then transferred to the orthopedic floor.  Started on Eliquis, aspirin, SCDs, TED hose.  Heels elevated on bed with rolled towels. No evidence of DVT. Negative Homan. Physical therapy  started on day #1 for gait training and transfer. OT started day #1 for ADL and assisted devices.  Patient's foley was d/c on day #1. Patient's IV was d/c on day #2.  On post op day #1 patient was stable and ready for discharge to home with home health PT.    He was given perioperative antibiotics:  Anti-infectives (From admission,  onward)    Start     Dose/Rate Route Frequency Ordered Stop   03/26/21 1930  ceFAZolin (ANCEF) IVPB 2g/100 mL premix        2 g 200 mL/hr over 30 Minutes Intravenous Every 6 hours 03/26/21 1518 03/27/21 0106   03/26/21 1138  ceFAZolin (ANCEF) 2-4 GM/100ML-% IVPB       Note to Pharmacy: [place] [place] L: cabinet override      03/26/21 1138 03/26/21 1341   03/26/21 0600  ceFAZolin (ANCEF) IVPB 2g/100 mL premix        2 g 200 mL/hr over 30 Minutes Intravenous On call to O.R. 03/26/21 0035 03/26/21 1310     .  He was given sequential compression devices, early ambulation, and Eliquis, aspirin for DVT prophylaxis.  He benefited maximally from the hospital stay and there were no complications.    Recent vital signs:  Vitals:   03/27/21 0329 03/27/21 0754  BP: 102/66 110/64  Pulse: 61 62  Resp: 16 16  Temp: 97.6 F (36.4 C) 98 F (36.7 C)  SpO2: 99% 98%    Recent laboratory studies:  Lab Results  Component Value Date   HGB 12.0 (L) 03/27/2021   HGB 15.0 03/12/2021   HGB 14.9 10/14/2019   Lab Results  Component Value Date   WBC 10.0 03/27/2021   PLT 160 03/27/2021   Lab Results  Component Value Date   INR 1.0 10/14/2019   Lab Results  Component Value Date   NA 136 03/27/2021   K 3.6 03/27/2021   CL 102 03/27/2021   CO2 28 03/27/2021   BUN 12 03/27/2021   CREATININE 0.88 03/27/2021   GLUCOSE 118 (H) 03/27/2021    Discharge Medications:   Allergies as of 03/27/2021       Reactions   Adhesive [tape] Rash   Paper tape okay   Coumadin [warfarin] Rash        Medication List     STOP taking these medications    naproxen sodium 220 MG tablet Commonly known as: ALEVE       TAKE these medications    acetaminophen 500 MG tablet Commonly known as: TYLENOL Take 500 mg by mouth every 6 (six) hours as needed for moderate pain or headache.   albuterol 108 (90 Base) MCG/ACT inhaler Commonly known as: VENTOLIN HFA Inhale 2 puffs into the lungs every 6  (six) hours as needed for wheezing or shortness of breath.   amiodarone 200 MG tablet Commonly known as: PACERONE TAKE ONE TABLET BY MOUTH DAILY   apixaban 5 MG Tabs tablet Commonly known as: Eliquis Take 1 tablet (5 mg total) by mouth 2 (two) times daily.   ARTIFICIAL TEARS OP Place 1 drop into both eyes daily.   aspirin 81 MG EC tablet Take 81 mg by mouth daily.   atenolol 25 MG tablet Commonly known as: TENORMIN TAKE ONE TABLET BY MOUTH DAILY   cholecalciferol 25 MCG (1000 UNIT) tablet Commonly known as: VITAMIN D3 Take 1,000 Units by mouth daily.   docusate sodium 100 MG capsule Commonly known as: COLACE Take 1 capsule (100  mg total) by mouth 2 (two) times daily.   HYDROcodone-acetaminophen 7.5-325 MG tablet Commonly known as: NORCO Take 1-2 tablets by mouth every 4 (four) hours as needed for severe pain (pain score 7-10).   Incruse Ellipta 62.5 MCG/ACT Aepb Generic drug: umeclidinium bromide Inhale 1 puff into the lungs daily.   latanoprost 0.005 % ophthalmic solution Commonly known as: XALATAN Place 1 drop into both eyes at bedtime.   levothyroxine 75 MCG tablet Commonly known as: SYNTHROID Take 75 mcg by mouth See admin instructions. Take 75 mcg daily Monday through Saturday, skip Sunday dose   methocarbamol 500 MG tablet Commonly known as: ROBAXIN Take 1 tablet (500 mg total) by mouth every 6 (six) hours as needed for muscle spasms.   multivitamin with minerals tablet Take 1 tablet by mouth daily.   ondansetron 4 MG tablet Commonly known as: ZOFRAN Take 1 tablet (4 mg total) by mouth every 6 (six) hours as needed for nausea.   SUMAtriptan 100 MG tablet Commonly known as: IMITREX Take 100 mg by mouth every 2 (two) hours as needed for migraine. May repeat in 2 hours if headache persists or recurs.   traMADol 50 MG tablet Commonly known as: ULTRAM Take 1 tablet (50 mg total) by mouth every 6 (six) hours as needed.               Durable  Medical Equipment  (From admission, onward)           Start     Ordered   03/26/21 1518  DME Walker rolling  Once       Question Answer Comment  Walker: With 5 Inch Wheels   Patient needs a walker to treat with the following condition Status post total hip replacement, right      03/26/21 1518   03/26/21 1518  DME 3 n 1  Once        02 /03/23 1518   03/26/21 1518  DME Bedside commode  Once       Question:  Patient needs a bedside commode to treat with the following condition  Answer:  Status post total hip replacement, right   03/26/21 1518            Diagnostic Studies: DG C-Arm 1-60 Min-No Report  Result Date: 03/26/2021 Fluoroscopy was utilized by the requesting physician.  No radiographic interpretation.   DG C-Arm 1-60 Min-No Report  Result Date: 03/26/2021 Fluoroscopy was utilized by the requesting physician.  No radiographic interpretation.   DG HIP UNILAT WITH PELVIS 1V RIGHT  Result Date: 03/26/2021 CLINICAL DATA:  Hip replacement EXAM: DG HIP (WITH OR WITHOUT PELVIS) 1V RIGHT COMPARISON:  None. FINDINGS: Intraoperative images during right hip arthroplasty. 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 Right hip arthroplasty is in normal alignment without evidence of immediate hardware complication. IMPRESSION: Intraoperative images during right hip arthroplasty. No evidence of immediate hardware complication. Electronically Signed   By: Maurine Simmering M.D.   On: 03/26/2021 15:19   DG HIP UNILAT W OR W/O PELVIS 2-3 VIEWS RIGHT  Result Date: 03/26/2021 CLINICAL DATA:  Status post right hip arthroplasty, pain EXAM: DG HIP (WITH OR WITHOUT PELVIS) 2-3V RIGHT COMPARISON:  None. FINDINGS: There is recent right hip arthroplasty. No fracture is seen. Degenerative changes are noted in the left hip. Skin staples and pockets of air in the soft tissues are noted adjacent to the right hip. There is previous surgical fusion in the lower lumbar spine.  IMPRESSION: Right  hip arthroplasty. No recent fracture is seen. Degenerative changes are noted in the left hip. Electronically Signed   By: Elmer Picker M.D.   On: 03/26/2021 15:32    Disposition:      Follow-up Information     Duanne Guess, PA-C Follow up in 2 week(s).   Specialties: Orthopedic Surgery, Emergency Medicine Contact information: [place] [address] [phone]                  Signed: Feliberto Gottron 03/27/2021, 8:36 AM

## 2021-03-27 NOTE — TOC Progression Note (Signed)
Transition of Care [facility]) - Progression Note    Patient Details  Name: Bobby Flowers MRN: [id number] Date of Birth: 04/09/1950  Transition of Care [facility]) CM/SW [place], LCSW Phone Number: 03/27/2021, 2:55 PM  Clinical Narrative:   Shower chair ordered through [employer]. [place] with [facility] notified of dc.    Expected Discharge Plan: [place] Barriers to Discharge: Continued Medical Work up  Expected Discharge Plan and Services Expected Discharge Plan: [place] Choice: [place] arrangements for the past 2 months: Single Family Home Expected Discharge Date: 03/27/21                                     Social Determinants of Health (SDOH) Interventions    Readmission Risk Interventions No flowsheet data found.

## 2021-03-28 NOTE — Anesthesia Postprocedure Evaluation (Signed)
Anesthesia Post Note  Patient: Bobby Flowers  Procedure(s) Performed: TOTAL HIP ARTHROPLASTY ANTERIOR APPROACH (Right: Hip)  Patient location during evaluation: PACU Anesthesia Type: Spinal Level of consciousness: awake and alert Pain management: pain level controlled Vital Signs Assessment: post-procedure vital signs reviewed and stable Respiratory status: spontaneous breathing, nonlabored ventilation and respiratory function stable Cardiovascular status: blood pressure returned to baseline and stable Postop Assessment: no apparent nausea or vomiting and spinal receding Anesthetic complications: no   No notable events documented.   Last Vitals:  Vitals:   03/27/21 1126 03/27/21 1518  BP: 103/73 118/69  Pulse: (!) 58 (!) 54  Resp: 18 18  Temp: 36.9 C 36.8 C  SpO2: 99% 99%    Last Pain:  Vitals:   03/27/21 1302  TempSrc:   PainSc: [place]

## 2021-03-29 ENCOUNTER — Encounter: Payer: Self-pay | Admitting: Orthopedic Surgery

## 2021-03-30 DIAGNOSIS — J438 Other emphysema: Secondary | ICD-10-CM | POA: Insufficient documentation

## 2021-03-30 DIAGNOSIS — G4734 Idiopathic sleep related nonobstructive alveolar hypoventilation: Secondary | ICD-10-CM | POA: Insufficient documentation

## 2021-03-30 LAB — SURGICAL PATHOLOGY

## 2021-03-30 NOTE — Assessment & Plan Note (Signed)
-   Dyspnea improved with addition of LAMA. He has a rare dry cough. CAT score 10. CT chest showed bilateral paraseptal emphysematous changes related to smoking. PFTs in September 2022 without obvious airway obstruction. Continue Incruse Ellipta one puff daily. Needs 6MWT.

## 2021-03-30 NOTE — Assessment & Plan Note (Signed)
-   Continue 1L oxygen at night while sleeping

## 2021-04-01 ENCOUNTER — Other Ambulatory Visit: Payer: Self-pay | Admitting: Family

## 2021-04-01 NOTE — Telephone Encounter (Signed)
Rx(s) sent to pharmacy electronically.  

## 2021-05-10 ENCOUNTER — Ambulatory Visit: Payer: Medicare HMO

## 2021-05-24 ENCOUNTER — Ambulatory Visit (INDEPENDENT_AMBULATORY_CARE_PROVIDER_SITE_OTHER): Payer: Medicare HMO

## 2021-05-24 DIAGNOSIS — R0609 Other forms of dyspnea: Secondary | ICD-10-CM

## 2021-05-24 NOTE — Progress Notes (Signed)
Pt in office for SMW test ?

## 2021-08-26 ENCOUNTER — Other Ambulatory Visit: Payer: Self-pay | Admitting: Surgery

## 2021-08-26 DIAGNOSIS — I712 Thoracic aortic aneurysm, without rupture, unspecified: Secondary | ICD-10-CM

## 2021-10-12 ENCOUNTER — Other Ambulatory Visit: Payer: Self-pay | Admitting: Family

## 2021-10-18 ENCOUNTER — Ambulatory Visit
Admission: RE | Admit: 2021-10-18 | Discharge: 2021-10-18 | Disposition: A | Discharge status: Home / Self Care | Payer: Medicare HMO | Source: Ambulatory Visit | Attending: Surgery | Admitting: Surgery

## 2021-10-18 ENCOUNTER — Ambulatory Visit: Payer: Medicare HMO | Admitting: Physician Assistant

## 2021-10-18 ENCOUNTER — Other Ambulatory Visit: Payer: Medicare HMO

## 2021-10-18 VITALS — BP 130/74 | HR 68 | Resp 18 | Ht 69.0 in | Wt 180.0 lb

## 2021-10-18 DIAGNOSIS — I7121 Aneurysm of the ascending aorta, without rupture: Secondary | ICD-10-CM

## 2021-10-18 DIAGNOSIS — I712 Thoracic aortic aneurysm, without rupture, unspecified: Secondary | ICD-10-CM

## 2021-10-18 MED ORDER — IOPAMIDOL (ISOVUE-370) INJECTION 76%
75.0000 mL | Freq: Once | INTRAVENOUS | Status: AC | PRN
  Administered 2021-10-18: 75 mL via INTRAVENOUS

## 2021-10-18 NOTE — Progress Notes (Signed)
[address]       [place] [address]             [phone]       History of present illness: Mr. Winnett is a 71 year old gentleman with a past history of chronic lower extremity pain,  lumbar radiculopathy, thyroid disease, mitral valve prolapse, atrial fibrillation, unstable angina, and COPD.  He was  discovered to have a thoracic aortic aneurysm on routine echocardiography in 2019.  At that time, it measured 4.4 cm by echo.  He has a trileaflet aortic valve with normal function.  He has been followed annually in our office for surveillance of the thoracic aortic aneurysm.  The aorta has remained stable at around 4.5 cm since he was first referred to [place] in 2020. Mr. Kahl says he feels well today.  He has had no changes in his health recently.  He denies any chest pain, shortness of breath, or dizziness.  Current Outpatient Medications  Medication Sig Dispense Refill   acetaminophen (TYLENOL) 500 MG tablet Take 500 mg by mouth every 6 (six) hours as needed for moderate pain or headache.     albuterol (VENTOLIN HFA) 108 (90 Base) MCG/ACT inhaler Inhale 2 puffs into the lungs every 6 (six) hours as needed for wheezing or shortness of breath. 8 g 2   amiodarone (PACERONE) 200 MG tablet TAKE ONE TABLET BY MOUTH DAILY 90 tablet 3   apixaban (ELIQUIS) 5 MG TABS tablet Take 1 tablet (5 mg total) by mouth 2 (two) times daily. (Patient not taking: Reported on 03/03/2021) 60 tablet 6   aspirin 81 MG EC tablet Take 81 mg by mouth daily.     atenolol (TENORMIN) 25 MG tablet TAKE ONE TABLET BY MOUTH DAILY 90 tablet 1   Carboxymethylcellulose Sodium (ARTIFICIAL TEARS OP) Place 1 drop into both eyes daily.     cholecalciferol (VITAMIN D3) 25 MCG (1000 UNIT) tablet Take 1,000 Units by mouth daily.     docusate sodium (COLACE) 100 MG capsule Take 1 capsule (100 mg total) by mouth 2 (two) times daily. 10 capsule 0   HYDROcodone-acetaminophen (NORCO) 7.5-325 MG tablet Take 1-2 tablets by  mouth every 4 (four) hours as needed for severe pain (pain score 7-10). 40 tablet 0   INCRUSE ELLIPTA 62.5 MCG/ACT AEPB Inhale 1 puff into the lungs daily.     latanoprost (XALATAN) 0.005 % ophthalmic solution Place 1 drop into both eyes at bedtime.      levothyroxine (SYNTHROID, LEVOTHROID) 75 MCG tablet Take 75 mcg by mouth See admin instructions. Take 75 mcg daily Monday through Saturday, skip Sunday dose     methocarbamol (ROBAXIN) 500 MG tablet Take 1 tablet (500 mg total) by mouth every 6 (six) hours as needed for muscle spasms. 30 tablet 0   Multiple Vitamins-Minerals (MULTIVITAMIN WITH MINERALS) tablet Take 1 tablet by mouth daily.     ondansetron (ZOFRAN) 4 MG tablet Take 1 tablet (4 mg total) by mouth every 6 (six) hours as needed for nausea. 10 tablet 0   SUMAtriptan (IMITREX) 100 MG tablet Take 100 mg by mouth every 2 (two) hours as needed for migraine. May repeat in 2 hours if headache persists or recurs.     traMADol (ULTRAM) 50 MG tablet Take 1 tablet (50 mg total) by mouth every 6 (six) hours as needed. 40 tablet 0   No current facility-administered medications for this visit.    Physical Exam: VS: BP 130/74  HR 68  RR 18  General: Mr. Arriaga appears well, in no distress Heart: Regular rate and rhythm, no murmur Chest: Breath sounds clear to auscultation Extremities: No peripheral edema, all are well-perfused.   Diagnostic Tests: CLINICAL DATA:  Aortic aneurysm, known or suspected follow-up study   EXAM: CT ANGIOGRAPHY CHEST WITH CONTRAST   TECHNIQUE: Multidetector CT imaging of the chest was performed using the standard protocol during bolus administration of intravenous contrast. Multiplanar CT image reconstructions and MIPs were obtained to evaluate the vascular anatomy.   RADIATION DOSE REDUCTION: This exam was performed according to the departmental dose-optimization program which includes automated exposure control, adjustment of the mA and/or kV according  to patient size and/or use of iterative reconstruction technique.   CONTRAST:  86m ISOVUE-370 IOPAMIDOL (ISOVUE-370) INJECTION 76%   COMPARISON:  October 14, 2020   FINDINGS: Cardiovascular: Preferential opacification of the thoracic aorta. Ascending thoracic aorta measures 4.5 cm without dissection or intramural hematoma, stable. Normal three-vessel anatomy of the arch of the aorta. Normal heart size. No pericardial effusion.   Mediastinum/Nodes: No enlarged mediastinal, hilar, or axillary lymph nodes. Thyroid gland, trachea, and esophagus demonstrate no significant findings.   Lungs/Pleura: Again seen is the upper lobe predominant paraseptal emphysema. Azygous lobe. Stable 4 mm subpleural pulmonary nodule in the anterior left upper lobe (image 45/6). There is 5 mm nodule seen in the lingula (image 76/6) in comparison to 3.3 mm on the previous study. There is thin scar seen in the lingula and has mildly increased in the interim.   Upper Abdomen: Peripheral nodular enhancing lesion in the segment 6 measuring 1.8 cm without significant interval change. Stable hypodense lesions consistent with cysts in the liver with the largest cyst measuring 1.3 cm in the medial segment of the left lobe of the liver.   Musculoskeletal: No chest wall abnormality. No acute or significant osseous findings.   Review of the MIP images confirms the above findings.   IMPRESSION: 1.  Ascending thoracic aorta measures 4.5 cm, stable.   Ascending thoracic aortic aneurysm. Recommend semi-annual imaging followup by CTA or MRA and referral to cardiothoracic surgery if not already obtained. This recommendation follows 2010 ACCF/AHA/AATS/ACR/ASA/SCA/SCAI/SIR/STS/SVM Guidelines for the Diagnosis and Management of Patients With Thoracic Aortic Disease. Circulation. 2010; 121:: O294-T654 Aortic aneurysm NOS (ICD10-I71.9)   2. There are couple of pulmonary nodules in the left lung with the larger nodule  measuring 5 mm in comparison to 3.3 mm on the previous study. Attention to this nodule in the lingula to be made in the subsequent follow-up imaging.   No follow-up needed if patient is low-risk (and has no known or suspected primary neoplasm). Non-contrast chest CT can be considered in 12 months if patient is high-risk. This recommendation follows the consensus statement: Guidelines for Management of Incidental Pulmonary Nodules Detected on CT Images: From the Fleischner Society 2017; Radiology 2017; 284:228-243.   3. Stable likely hemangioma in segment 6 and the benign-appearing hepatic cysts in the liver.     Electronically Signed   By: AFrazier RichardsM.D.   On: 10/18/2021 15:50  Impression / Plan: Stable 4.5 cm ascending aortic aneurysm.  Additionally, he has a few pulmonary nodules that were seen on previous CTs..  There is a 5 mm nodule in the lingula of the left lung that measured 3.3 mm 1 year ago. He understands the importance of avoiding strenuous activity.  His blood pressure is well controlled. Plan follow-up in 1 year with CTA chest.   MParks Neptune  Lorenza Burton, PA-C [facility] [phone]

## 2021-10-18 NOTE — Patient Instructions (Signed)
Avoid strenuous activity with no lifting greater than 50 pounds  Monitor your blood pressure regularly and alert your PCP if your blood pressure is consistently greater than 130/80  Avoid the fluoroquinolone class of antibiotics  Follow-up in 1 year with CTA chest

## 2021-10-26 ENCOUNTER — Ambulatory Visit: Payer: Medicare HMO | Admitting: Internal Medicine

## 2021-10-26 ENCOUNTER — Encounter: Payer: Self-pay | Admitting: Internal Medicine

## 2021-10-26 VITALS — BP 124/72 | HR 57 | Temp 97.9°F | Ht 69.0 in | Wt 177.8 lb

## 2021-10-26 DIAGNOSIS — J431 Panlobular emphysema: Secondary | ICD-10-CM | POA: Diagnosis not present

## 2021-10-26 DIAGNOSIS — J9611 Chronic respiratory failure with hypoxia: Secondary | ICD-10-CM | POA: Diagnosis not present

## 2021-10-26 NOTE — Patient Instructions (Addendum)
Continue oxygen as prescribed Continue inhalers as prescribed Avoid second hand smoke Follow up CT chest in 1 year  Pulmonary function test and CT scans reviewed with patient

## 2021-10-26 NOTE — Progress Notes (Addendum)
[facility] Consultation      Date: 10/26/2021,   MRN# [id number] Bobby Flowers 06-03-1950      CHIEF COMPLAINT:  SOB Follow up abnormal CT chest On amiodarone therapy   SYNOPSIS  70 y.o. male who presents for complaints of SOB H/o persistent atrial fibrillation  and mitral valve prolapse with mild regurgitation.  He has prolonged history of atrial fibrillation which was diagnosed in the 79s.  He was briefly on warfarin but had an allergic reaction with a rash. He has not been on any anticoagulant since then given his low CHADS2 VASc score.  He quit smoking in 2002 and does not consume alcohol. In 2015, patient did not undergo ablation therapy and was given low dose AMIODARONE He had recurrent atrial fibrillation in 2016 but he converted back to sinus rhythm after increasing the dose of amiodarone.  He had left heart catheterization in 2019 which showed normal coronary arteries with very unusual horizontal orientation of the heart. Echocardiogram 2019 showed normal LV systolic function with moderately dilated ascending aorta at 4.4 cm. His atrial fibrillation has been well controlled with amiodarone 200 mg once daily. He had back surgery twice last year and feels that he became deconditioned after that.   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 -Patient benefits from oxygen therapy 1L [place]  at night -recommend using oxygen as prescribed -patient needs this for survival  No exacerbation at this time No evidence of heart failure at this time No evidence or signs of infection at this time No respiratory distress No fevers, chills, nausea, vomiting, diarrhea No evidence of lower extremity edema No evidence  hemoptysis    Doing well with INCRUSE Exacerbation several months ago responded well with PRED and ABX         CT of the chest from August 2022 reviewed with patient Bilateral paraseptal cystic changes consistent with emphysema There are several subcentimeter pulmonary nodules bilaterally         CT of the chest 10/2021 Independent review today 10/26/2021 Bilateral paraseptal cystic changes consistent with emphysema There are several subcentimeter pulmonary nodules bilaterally +PULM NODULES recommend follow up CT chest in 6 months   Current Medication:  Current Outpatient Medications:    acetaminophen (TYLENOL) 500 MG tablet, Take 500 mg by mouth every 6 (six) hours as needed for moderate pain or headache., Disp: , Rfl:    albuterol (VENTOLIN HFA) 108 (90 Base) MCG/ACT inhaler, Inhale 2 puffs into the lungs every 6 (six) hours as needed for wheezing or shortness of breath., Disp: 8 g, Rfl: 2   amiodarone (PACERONE) 200 MG tablet, TAKE ONE TABLET BY MOUTH DAILY, Disp: 90 tablet, Rfl: 3   aspirin 81 MG EC tablet, Take 81 mg by mouth daily., Disp: , Rfl:    atenolol (TENORMIN) 25 MG tablet, TAKE ONE TABLET BY MOUTH DAILY, Disp: 90 tablet, Rfl: 1   Carboxymethylcellulose Sodium (ARTIFICIAL TEARS OP), Place 1 drop into both eyes daily., Disp: , Rfl:    cholecalciferol (VITAMIN D3) 25 MCG (1000 UNIT) tablet, Take 1,000 Units by mouth daily., Disp: , Rfl:    HYDROcodone-acetaminophen (NORCO) 7.5-325 MG tablet, Take 1-2 tablets by mouth every 4 (four) hours as needed for  severe pain (pain score 7-10)., Disp: 40 tablet, Rfl: 0   INCRUSE ELLIPTA 62.5 MCG/ACT AEPB, Inhale 1 puff into the lungs daily., Disp: , Rfl:    latanoprost (XALATAN) 0.005 % ophthalmic solution, Place 1 drop into both eyes at bedtime. , Disp: , Rfl:    levothyroxine (SYNTHROID, LEVOTHROID) 75 MCG tablet, Take 75 mcg by mouth See admin instructions. Take 75 mcg daily Monday through Saturday, skip Sunday dose, Disp:  , Rfl:    Multiple Vitamins-Minerals (MULTIVITAMIN WITH MINERALS) tablet, Take 1 tablet by mouth daily., Disp: , Rfl:     ALLERGIES   Adhesive [tape] and Coumadin [warfarin]     REVIEW OF SYSTEMS      Review of Systems: Gen:  Denies  fever, sweats, chills weight loss  HEENT: Denies blurred vision, double vision, ear pain, eye pain, hearing loss, nose bleeds, sore throat Cardiac:  No dizziness, chest pain or heaviness, chest tightness,edema, No JVD Resp:   No cough, -sputum production, +shortness of breath,-wheezing, -hemoptysis,  Other:  All other systems negative   BP 124/72 (BP Location: Left Arm, Cuff Size: Normal)   Pulse (!) 57   Temp 97.9 F (36.6 C) (Temporal)   Ht '5\' 9"'$  (1.753 m)   Wt 177 lb 12.8 oz (80.6 kg)   SpO2 99%   BMI 26.26 kg/m     Physical Examination:   General Appearance: No distress  EYES PERRLA, EOM intact.   NECK Supple, No JVD Pulmonary: normal breath sounds, No wheezing.  CardiovascularNormal S1,S2.  No m/r/g.   ALL OTHER ROS ARE NEGATIVE    IMAGING    CT ANGIO CHEST AORTA W/CM & OR WO/CM  Result Date: 10/18/2021 CLINICAL DATA:  Aortic aneurysm, known or suspected follow-up study EXAM: CT ANGIOGRAPHY CHEST WITH CONTRAST TECHNIQUE: Multidetector CT imaging of the chest was performed using the standard protocol during bolus administration of intravenous contrast. Multiplanar CT image reconstructions and MIPs were obtained to evaluate the vascular anatomy. RADIATION DOSE REDUCTION: This exam was performed according to the departmental dose-optimization program which includes automated exposure control, adjustment of the mA and/or kV according to patient size and/or use of iterative reconstruction technique. CONTRAST:  96m ISOVUE-370 IOPAMIDOL (ISOVUE-370) INJECTION 76% COMPARISON:  October 14, 2020 FINDINGS: Cardiovascular: Preferential opacification of the thoracic aorta. Ascending thoracic aorta measures 4.5 cm without dissection or  intramural hematoma, stable. Normal three-vessel anatomy of the arch of the aorta. Normal heart size. No pericardial effusion. Mediastinum/Nodes: No enlarged mediastinal, hilar, or axillary lymph nodes. Thyroid gland, trachea, and esophagus demonstrate no significant findings. Lungs/Pleura: Again seen is the upper lobe predominant paraseptal emphysema. Azygous lobe. Stable 4 mm subpleural pulmonary nodule in the anterior left upper lobe (image 45/6). There is 5 mm nodule seen in the lingula (image 76/6) in comparison to 3.3 mm on the previous study. There is thin scar seen in the lingula and has mildly increased in the interim. Upper Abdomen: Peripheral nodular enhancing lesion in the segment 6 measuring 1.8 cm without significant interval change. Stable hypodense lesions consistent with cysts in the liver with the largest cyst measuring 1.3 cm in the medial segment of the left lobe of the liver. Musculoskeletal: No chest wall abnormality. No acute or significant osseous findings. Review of the MIP images confirms the above findings. IMPRESSION: 1.  Ascending thoracic aorta measures 4.5 cm, stable. Ascending thoracic aortic aneurysm. Recommend semi-annual imaging followup by CTA or MRA and referral to cardiothoracic surgery if not already obtained. This recommendation follows 2010 ACCF/AHA/AATS/ACR/ASA/SCA/SCAI/SIR/STS/SVM  Guidelines for the Diagnosis and Management of Patients With Thoracic Aortic Disease. Circulation. 2010; 121: Y641-R830. Aortic aneurysm NOS (ICD10-I71.9) 2. There are couple of pulmonary nodules in the left lung with the larger nodule measuring 5 mm in comparison to 3.3 mm on the previous study. Attention to this nodule in the lingula to be made in the subsequent follow-up imaging. No follow-up needed if patient is low-risk (and has no known or suspected primary neoplasm). Non-contrast chest CT can be considered in 12 months if patient is high-risk. This recommendation follows the consensus  statement: Guidelines for Management of Incidental Pulmonary Nodules Detected on CT Images: From the Fleischner Society 2017; Radiology 2017; 284:228-243. 3. Stable likely hemangioma in segment 6 and the benign-appearing hepatic cysts in the liver. Electronically Signed   By: Frazier Richards M.D.   On: 10/18/2021 15:50      ASSESSMENT/PLAN   71 year old pleasant white male seen today for follow up SOB and dyspnea exertion that has been worsening over the last 1 years since his back surgery in the setting of mitral valve prolapse with mild regurgitation with a history of A. fib on amiodarone therapy of 200 mg daily for the last 6 years I was asked to see patient to assess whether his shortness of breath is related to pulmonary toxicity from amiodarone however at this time with a CT of the chest that I reviewed which shows bilateral paraseptal emphysematous changes most likely related to smoking I do not see any evidence of interstitial lung disease from pulmonary toxicity    I have reviewed the CT of the chest with the patient in detail Patient does have an ascending aortic aneurysm as well and will need appropriate follow-up  COPD/EMPHYSEMA IS STABLE No exacerbation at this time Continue inhalers as prescribed Avoid secondhand smoke  Abnormal CT chest Aortic aneurysm followed by CT surgery Pulmonary nodules follow-up CT chest in 1 year Paraseptal emphysema  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 All questions answered  Follow-up 1 year  Total time spent 20 minutes       Maretta Bees Patricia Pesa, M.D.  [facility]  Medical Director [place] Director [facility] Cardio-Pulmonary Department

## 2021-10-29 ENCOUNTER — Telehealth: Payer: Self-pay | Admitting: Internal Medicine

## 2021-11-02 ENCOUNTER — Emergency Department
Admission: EM | Admit: 2021-11-02 | Discharge: 2021-11-02 | Disposition: A | Discharge status: Home / Self Care | Payer: Medicare HMO | Attending: Emergency Medicine | Admitting: Emergency Medicine

## 2021-11-02 ENCOUNTER — Emergency Department: Payer: Medicare HMO

## 2021-11-02 ENCOUNTER — Other Ambulatory Visit: Payer: Self-pay

## 2021-11-02 DIAGNOSIS — N200 Calculus of kidney: Secondary | ICD-10-CM | POA: Diagnosis not present

## 2021-11-02 DIAGNOSIS — E039 Hypothyroidism, unspecified: Secondary | ICD-10-CM | POA: Diagnosis not present

## 2021-11-02 DIAGNOSIS — D72829 Elevated white blood cell count, unspecified: Secondary | ICD-10-CM | POA: Diagnosis not present

## 2021-11-02 DIAGNOSIS — R1032 Left lower quadrant pain: Secondary | ICD-10-CM | POA: Diagnosis present

## 2021-11-02 DIAGNOSIS — J449 Chronic obstructive pulmonary disease, unspecified: Secondary | ICD-10-CM | POA: Diagnosis not present

## 2021-11-02 LAB — LIPASE, BLOOD: Lipase: 35 U/L (ref 11–51)

## 2021-11-02 LAB — COMPREHENSIVE METABOLIC PANEL
ALT: 29 U/L (ref 0–44)
AST: 26 U/L (ref 15–41)
Albumin: 4.1 g/dL (ref 3.5–5.0)
Alkaline Phosphatase: 75 U/L (ref 38–126)
Anion gap: 9 (ref 5–15)
BUN: 18 mg/dL (ref 8–23)
CO2: 28 mmol/L (ref 22–32)
Calcium: 9 mg/dL (ref 8.9–10.3)
Chloride: 105 mmol/L (ref 98–111)
Creatinine, Ser: 1.26 mg/dL — ABNORMAL HIGH (ref 0.61–1.24)
GFR, Estimated: >60 mL/min (ref >60)
Glucose, Bld: 111 mg/dL — ABNORMAL HIGH (ref 70–99)
Potassium: 4.3 mmol/L (ref 3.5–5.1)
Sodium: 142 mmol/L (ref 135–145)
Total Bilirubin: 1.7 mg/dL — ABNORMAL HIGH (ref 0.3–1.2)
Total Protein: 7.2 g/dL (ref 6.5–8.1)

## 2021-11-02 LAB — CBC WITH DIFFERENTIAL/PLATELET
Abs Immature Granulocytes: 0.1 10*3/uL — ABNORMAL HIGH (ref 0.00–0.07)
Basophils Absolute: 0 10*3/uL (ref 0.0–0.1)
Basophils Relative: 0 %
Eosinophils Absolute: 0.1 10*3/uL (ref 0.0–0.5)
Eosinophils Relative: 1 %
HCT: 46.5 % (ref 39.0–52.0)
Hemoglobin: 15.3 g/dL (ref 13.0–17.0)
Immature Granulocytes: 1 %
Lymphocytes Relative: 11 %
Lymphs Abs: 1.3 10*3/uL (ref 0.7–4.0)
MCH: 29.9 pg (ref 26.0–34.0)
MCHC: 32.9 g/dL (ref 30.0–36.0)
MCV: 91 fL (ref 80.0–100.0)
Monocytes Absolute: 0.7 10*3/uL (ref 0.1–1.0)
Monocytes Relative: 6 %
Neutro Abs: 9.1 10*3/uL — ABNORMAL HIGH (ref 1.7–7.7)
Neutrophils Relative %: 81 %
Platelets: 213 10*3/uL (ref 150–400)
RBC: 5.11 MIL/uL (ref 4.22–5.81)
RDW: 13.9 % (ref 11.5–15.5)
WBC: 11.2 10*3/uL — ABNORMAL HIGH (ref 4.0–10.5)
nRBC: 0 % (ref 0.0–0.2)

## 2021-11-02 LAB — URINALYSIS, ROUTINE W REFLEX MICROSCOPIC
Bacteria, UA: NONE SEEN
Bilirubin Urine: NEGATIVE
Glucose, UA: NEGATIVE mg/dL
Ketones, ur: NEGATIVE mg/dL
Leukocytes,Ua: NEGATIVE
Nitrite: NEGATIVE
Protein, ur: NEGATIVE mg/dL
RBC / HPF: >50 RBC/hpf — ABNORMAL HIGH (ref 0–5)
Specific Gravity, Urine: 1.015 (ref 1.005–1.030)
pH: 5 (ref 5.0–8.0)

## 2021-11-02 MED ORDER — ONDANSETRON 4 MG PO TBDP: 4.0000 mg | ORAL_TABLET | Freq: Three times a day (TID) | ORAL | 0 refills | Status: AC | PRN

## 2021-11-02 MED ORDER — IOHEXOL 300 MG/ML  SOLN
100.0000 mL | Freq: Once | INTRAMUSCULAR | Status: AC | PRN
  Administered 2021-11-02: 100 mL via INTRAVENOUS

## 2021-11-02 MED ORDER — ONDANSETRON 4 MG PO TBDP
4.0000 mg | ORAL_TABLET | Freq: Once | ORAL | Status: AC
  Administered 2021-11-02: 4 mg via ORAL
  Filled 2021-11-02: qty 1

## 2021-11-02 MED ORDER — OXYCODONE-ACETAMINOPHEN 5-325 MG PO TABS: 1.0000 | ORAL_TABLET | Freq: Four times a day (QID) | ORAL | 0 refills | Status: AC | PRN

## 2021-11-02 MED ORDER — OXYCODONE-ACETAMINOPHEN 5-325 MG PO TABS
1.0000 | ORAL_TABLET | Freq: Once | ORAL | Status: AC
  Administered 2021-11-02: 1 via ORAL
  Filled 2021-11-02: qty 1

## 2021-11-02 MED ORDER — SODIUM CHLORIDE 0.9 % IV BOLUS
1000.0000 mL | Freq: Once | INTRAVENOUS | Status: AC
  Administered 2021-11-02: 1000 mL via INTRAVENOUS

## 2021-11-02 MED ORDER — ONDANSETRON HCL 4 MG/2ML IJ SOLN
4.0000 mg | Freq: Once | INTRAMUSCULAR | Status: AC
Start: 2021-11-02 — End: 2021-11-02
  Administered 2021-11-02: 4 mg via INTRAVENOUS
  Filled 2021-11-02: qty 2

## 2021-11-02 MED ORDER — FENTANYL CITRATE PF 50 MCG/ML IJ SOSY
50.0000 ug | PREFILLED_SYRINGE | Freq: Once | INTRAMUSCULAR | Status: AC
  Administered 2021-11-02: 50 ug via INTRAVENOUS
  Filled 2021-11-02: qty 1

## 2021-11-02 NOTE — ED Triage Notes (Addendum)
Pt presents to ED from home C/O L flank pain radiating to LLQ starting about 3 hours ago. Denies n/v. Pt reports hx kidney stones, feels the same.

## 2021-11-02 NOTE — Discharge Instructions (Addendum)
You have an 8 mm stone in your left ureter Please reach out to Dr. Dene Gentry office to schedule an appointment. You have been prescribed Percocet for pain as well as Flomax. Please take Zofran with Percocet in order to avoid nausea. You can take MiraLAX with your Percocet in order to avoid constipation.

## 2021-11-02 NOTE — ED Notes (Signed)
See triage note  Presents with left flank pain  States pain started about 3 hours ago   HX of kidney stones in past

## 2021-11-02 NOTE — ED Provider Notes (Signed)
[facility] Provider Note  Patient Contact: 5:33 PM (approximate)   History   Flank Pain   HPI  Bobby Flowers is a 71 y.o. male with a history of hypothyroidism, nephrolithiasis, COPD and chronic pain, presents to the emergency department with left-sided flank pain.  Patient reports that he had an 8 mm kidney stone last year and pain currently feels similar.  He denies dysuria, fever or vomiting.  He states that he has had some nausea.  No chest pain, chest tightness or shortness of breath.      Physical Exam   Triage Vital Signs: ED Triage Vitals [11/02/21 1517]  Enc Vitals Group     BP (!) 164/85     Pulse Rate (!) 56     Resp 16     Temp 98.4 F (36.9 C)     Temp Source Oral     SpO2 99 %     Weight 175 lb (79.4 kg)     Height '5\' 9"'$  (1.753 m)     Head Circumference      Peak Flow      Pain Score 10     Pain Loc      Pain Edu?      Excl. in [place]?     Most recent vital signs: Vitals:   11/02/21 1517 11/02/21 1852  BP: (!) 164/85 (!) 169/95  Pulse: (!) 56 68  Resp: 16 16  Temp: 98.4 F (36.9 C)   SpO2: 99% 100%     General: Alert and in no acute distress. Eyes:  PERRL. EOMI. Head: No acute traumatic findings ENT:      Nose: No congestion/rhinnorhea.      Mouth/Throat: Mucous membranes are moist. Neck: No stridor. No cervical spine tenderness to palpation. Cardiovascular:  Good peripheral perfusion Respiratory: Normal respiratory effort without tachypnea or retractions. Lungs CTAB. Good air entry to the bases with no decreased or absent breath sounds. Gastrointestinal: Bowel sounds 4 quadrants. Soft and nontender to palpation. No guarding or rigidity. No palpable masses. No distention. Patient has left sided CVA tenderness.  Musculoskeletal: Full range of motion to all extremities.  Neurologic:  No gross focal neurologic deficits are appreciated.  Skin:   No rash noted Other:   ED Results / Procedures / Treatments    Labs (all labs ordered are listed, but only abnormal results are displayed) Labs Reviewed  CBC WITH DIFFERENTIAL/PLATELET - Abnormal; Notable for the following components:      Result Value   WBC 11.2 (*)    Neutro Abs 9.1 (*)    Abs Immature Granulocytes 0.10 (*)    All other components within normal limits  COMPREHENSIVE METABOLIC PANEL - Abnormal; Notable for the following components:   Glucose, Bld 111 (*)    Creatinine, Ser 1.26 (*)    Total Bilirubin 1.7 (*)    All other components within normal limits  URINALYSIS, ROUTINE W REFLEX MICROSCOPIC - Abnormal; Notable for the following components:   Color, Urine YELLOW (*)    APPearance HAZY (*)    Hgb urine dipstick LARGE (*)    RBC / HPF >50 (*)    All other components within normal limits  LIPASE, BLOOD        RADIOLOGY  I personally viewed and evaluated these images as part of my medical decision making, as well as reviewing the written report by the radiologist.  ED Provider Interpretation: Patient has an 8 mm left-sided proximal ureteral stone.  PROCEDURES:  Critical Care performed: No  Procedures   MEDICATIONS ORDERED IN ED: Medications  fentaNYL (SUBLIMAZE) injection 50 mcg (50 mcg Intravenous Given 11/02/21 1723)  ondansetron (ZOFRAN) injection 4 mg (4 mg Intravenous Given 11/02/21 1723)  iohexol (OMNIPAQUE) 300 MG/ML solution 100 mL (100 mLs Intravenous Contrast Given 11/02/21 1738)  sodium chloride 0.9 % bolus 1,000 mL (1,000 mLs Intravenous New Bag/Given 11/02/21 1802)  oxyCODONE-acetaminophen (PERCOCET/ROXICET) 5-325 MG per tablet 1 tablet (1 tablet Oral Given 11/02/21 1849)  ondansetron (ZOFRAN-ODT) disintegrating tablet 4 mg (4 mg Oral Given 11/02/21 1849)     IMPRESSION / MDM / ASSESSMENT AND PLAN / ED COURSE  I reviewed the triage vital signs and the nursing notes.                              Assessment and plan: Flank pain:   71 year old male presents to the emergency department with  left-sided flank pain that started at 11 AM today.  Patient was hypertensive at triage but vital signs were otherwise reassuring.  He was alert and nontoxic-appearing on exam with pain well controlled with fentanyl in the emergency department.  Urinalysis shows large amount of blood.  Patient had mild elevation in creatinine at 1.26 up from .77 7 months ago.  CBC shows mildly elevated white blood cell count.  Patient was given normal saline bolus in the emergency department.  I reached out to urologist on-call, Dr. Bernardo Heater who recommended pain control at home and anticipates seeing patient this week in the office.  Patient was prescribed Flomax and Percocet.  I also prescribed patient a short course of Zofran for nausea.  There were no signs of UTI on urinalysis.  I did offer admission for pain control for patient and he declined stating that he would like to try pain medication at home.  Patient was cautioned to return the emergency department with worsening pain, fever or other new constitutional symptoms.  He voiced understanding has easy access to the ER.   FINAL CLINICAL IMPRESSION(S) / ED DIAGNOSES   Final diagnoses:  Nephrolithiasis     Rx / DC Orders   ED Discharge Orders          Ordered    oxyCODONE-acetaminophen (PERCOCET/ROXICET) 5-325 MG tablet  Every 6 hours PRN        11/02/21 1851    ondansetron (ZOFRAN-ODT) 4 MG disintegrating tablet  Every 8 hours PRN        11/02/21 1851             Note:  This document was prepared using Dragon voice recognition software and may include unintentional dictation errors.   Vallarie Mare Shidler, Hershal Coria 11/02/21 1857    Blake Divine, MD 11/02/21 2237

## 2021-11-05 ENCOUNTER — Ambulatory Visit: Payer: Medicare HMO | Admitting: Urology

## 2021-11-05 ENCOUNTER — Encounter: Payer: Self-pay | Admitting: Urology

## 2021-11-05 ENCOUNTER — Telehealth: Payer: Self-pay

## 2021-11-05 ENCOUNTER — Other Ambulatory Visit: Payer: Self-pay | Admitting: Urology

## 2021-11-05 VITALS — BP 138/80 | HR 67 | Ht 69.0 in | Wt 178.0 lb

## 2021-11-05 DIAGNOSIS — N201 Calculus of ureter: Secondary | ICD-10-CM

## 2021-11-05 DIAGNOSIS — N1330 Unspecified hydronephrosis: Secondary | ICD-10-CM | POA: Diagnosis not present

## 2021-11-05 DIAGNOSIS — N23 Unspecified renal colic: Secondary | ICD-10-CM | POA: Diagnosis not present

## 2021-11-05 DIAGNOSIS — N132 Hydronephrosis with renal and ureteral calculous obstruction: Secondary | ICD-10-CM

## 2021-11-05 LAB — URINALYSIS, COMPLETE
Bilirubin, UA: NEGATIVE
Glucose, UA: NEGATIVE
Leukocytes,UA: NEGATIVE
Nitrite, UA: NEGATIVE
Specific Gravity, UA: 1.025 (ref 1.005–1.030)
Urobilinogen, Ur: 2 mg/dL — ABNORMAL HIGH (ref 0.2–1.0)
pH, UA: 6 (ref 5.0–7.5)

## 2021-11-05 LAB — MICROSCOPIC EXAMINATION

## 2021-11-05 MED ORDER — PROMETHAZINE HCL 12.5 MG PO TABS: 12.5000 mg | ORAL_TABLET | Freq: Four times a day (QID) | ORAL | 0 refills | Status: DC | PRN

## 2021-11-05 MED ORDER — TAMSULOSIN HCL 0.4 MG PO CAPS: 0.4000 mg | ORAL_CAPSULE | Freq: Every day | ORAL | 0 refills | Status: DC

## 2021-11-05 NOTE — Progress Notes (Signed)
Surgical Physician Order Form [facility] [place]  * Scheduling expectation :  Add on 9/19  *Length of Case: 60 minutes  *Clearance needed: no  *Anticoagulation Instructions: N/A  *Aspirin Instructions:  Hold 72 hours prior  *Post-op visit Date/Instructions:  1 week cysto stent removal  *Diagnosis: Left Ureteral Stone  *Procedure: Left Ureteroscopy w/laser lithotripsy & stent placement (12248)   Additional orders: N/A  -Admit type: OUTpatient  -Anesthesia: Choice  -VTE Prophylaxis Standing Order SCD's       Other:   -Standing Lab Orders Per Anesthesia    Lab other: UA&Urine Culture-ordered  -Standing Test orders EKG/Chest x-ray per Anesthesia       Test other:   - Medications:  Ancef 2gm IV  -Other orders:  N/A

## 2021-11-05 NOTE — H&P (View-Only) (Signed)
11/05/2021 4:51 PM   Bobby Flowers [birth year] [id number]  Referring provider: [name], MD [address] [facility] [place],  [place] [address]  Chief Complaint  Patient presents with   Nephrolithiasis    Urologic history: 1.  Nephrolithiasis Bilateral ureteroscopic stone removal by Dr. Gloriann Loan 07/2017 Small, nonobstructing left lower pole calculi   HPI: 71 y.o. male presents in follow-up of recent ED visit for renal colic.  Presented to [facility] ED 11/02/2021 with complaints of left flank pain radiating to the left side region Positive nausea/vomiting Denied fever, chills CT abdomen/pelvis with contrast was performed which showed moderate left hydronephrosis/hydroureter secondary to an 8 mm lower proximal ureteral calculus.  There were bilateral 2 mm lower pole nonobstructing calculi He received parenteral narcotic analgesics with pain improvement and was discharged on oxycodone and Zofran Was not prescribed tamsulosin Since ED visit he has had intermittent pain which is controlled with oxycodone Is having persistent nausea which does not improve with Zofran   PMH: Past Medical History:  Diagnosis Date   Chest tightness    a. 05/2017 Cath: nl cors.   Chronic pain of left lower extremity    right   COPD (chronic obstructive pulmonary disease) (HCC)    DDD (degenerative disc disease), lumbosacral    Dilated aortic root ([place])    a. 06/2017 Echo: Ao root 4.4cm.   Dysrhythmia    afib   Headache    migraines in past. none for 6-8 yrs.   History of kidney stones    Hypothyroidism    Mitral valve prolapse    a. 09/2015 Echo: EF 55-60%, no rwma, nl LA size, nl RV fxn, nl PASP; b. 06/2017 Echo: EF 55-60%, no rwma, Gr2 DD. Asc Ao 4.4cm. Mild MR (? mild prolapse). Mildly dil LA.   PAF (paroxysmal atrial fibrillation) ([place])    a. Dx in 1980's; b. CHA2DS2VASc = 1 - briefly on coumadin many years ago-->caused rash; c. Chronic Amio '200mg'$  QD.   Wears contact lenses      Surgical History: Past Surgical History:  Procedure Laterality Date   CARDIAC CATHETERIZATION     COLONOSCOPY  04/25/2005   COLONOSCOPY WITH PROPOFOL N/A 07/22/2015   Procedure: COLONOSCOPY WITH PROPOFOL;  Surgeon: Robert Bellow, MD;  Location: [facility];  Service: Endoscopy;  Laterality: N/A;   COLONOSCOPY WITH PROPOFOL N/A 09/04/2020   Procedure: COLONOSCOPY WITH PROPOFOL;  Surgeon: Robert Bellow, MD;  Location: [facility];  Service: Endoscopy;  Laterality: N/A;   CYSTOSCOPY WITH HOLMIUM LASER LITHOTRIPSY Bilateral 07/28/2017   Procedure: CYSTOSCOPY WITH HOLMIUM LASER LITHOTRIPSY;  Surgeon: Lucas Mallow, MD;  Location: [facility];  Service: Urology;  Laterality: Bilateral;   CYSTOSCOPY WITH STENT PLACEMENT Bilateral 07/28/2017   Procedure: CYSTOSCOPY WITH STENT PLACEMENT;  Surgeon: Lucas Mallow, MD;  Location: [facility];  Service: Urology;  Laterality: Bilateral;   CYSTOSCOPY WITH URETEROSCOPY Bilateral 07/28/2017   Procedure: CYSTOSCOPY WITH URETEROSCOPY;  Surgeon: Lucas Mallow, MD;  Location: [facility] OR[facility];  Service: Urology;  Laterality: Bilateral;   EYE SURGERY Left    cataract surgery   HEMORROIDECTOMY     HERNIA REPAIR Left 05/22/2015   Left inguinal hernia repair with medium Ultra Pro mesh   INGUINAL HERNIA REPAIR Left 07/22/2015   Procedure: HERNIA REPAIR INGUINAL ADULT;  Surgeon: Robert Bellow, MD;  Location: [facility];  Service: General;  Laterality: Left;   LEFT HEART CATH AND CORONARY ANGIOGRAPHY Left 05/29/2017   Procedure:  LEFT HEART CATH AND CORONARY ANGIOGRAPHY;  Surgeon: [name], MD;  Location: [place] [facility];  Service: Cardiovascular;  Laterality: Left;   LUMBAR FUSION N/A    LUMBAR LAMINECTOMY/DECOMPRESSION MICRODISCECTOMY Right 07/01/2019   Procedure: OPEN L4 LAMINECTOMY, L3/4 DISCECTOMY ON RIGHT, L4/5 DISCECTOMY;  Surgeon: Deetta Perla, MD;  Location: [facility];  Service: Neurosurgery;  Laterality: Right;    myringotomy     with tubes x 5.  tubes fall out frequently   MYRINGOTOMY WITH TUBE PLACEMENT Left 12/13/2018   Procedure: MYRINGOTOMY WITH T-TUBE PLACEMENT;  Surgeon: [name], MD;  Location: [place];  Service: ENT;  Laterality: Left;  needs to stay first case vaught / juengel doing case together   NASAL SINUS SURGERY     NASOPHARYNGOSCOPY EUSTATION TUBE BALLOON DILATION Left 12/13/2018   Procedure: NASOPHARYNGOSCOPY EUSTATION TUBE BALLOON DILATION WITH OUTFRACTURE OF TURBINATES;  Surgeon: [name], MD;  Location: [place];  Service: ENT;  Laterality: Left;   SPINE SURGERY N/A    TOTAL HIP ARTHROPLASTY Right 03/26/2021   Procedure: TOTAL HIP ARTHROPLASTY ANTERIOR APPROACH;  Surgeon: Hessie Knows, MD;  Location: [facility];  Service: Orthopedics;  Laterality: Right;   VASECTOMY      Home Medications:  Allergies as of 11/05/2021       Reactions   Adhesive [tape] Rash   Paper tape okay   Coumadin [warfarin] Rash        Medication List        Accurate as of November 05, 2021  4:51 PM. If you have any questions, ask your nurse or doctor.          STOP taking these medications    HYDROcodone-acetaminophen 7.5-325 MG tablet Commonly known as: NORCO Stopped by: Abbie Sons, MD       TAKE these medications    acetaminophen 500 MG tablet Commonly known as: TYLENOL Take 500 mg by mouth every 6 (six) hours as needed for moderate pain or headache.   albuterol 108 (90 Base) MCG/ACT inhaler Commonly known as: VENTOLIN HFA Inhale 2 puffs into the lungs every 6 (six) hours as needed for wheezing or shortness of breath.   amiodarone 200 MG tablet Commonly known as: PACERONE TAKE ONE TABLET BY MOUTH DAILY   aspirin EC 81 MG tablet Take 81 mg by mouth daily.   atenolol 25 MG tablet Commonly known as: TENORMIN TAKE ONE TABLET BY MOUTH DAILY   cholecalciferol 25 MCG (1000 UNIT) tablet Commonly known as: VITAMIN D3 Take 1,000 Units  by mouth daily.   Incruse Ellipta 62.5 MCG/ACT Aepb Generic drug: umeclidinium bromide Inhale 1 puff into the lungs daily.   latanoprost 0.005 % ophthalmic solution Commonly known as: XALATAN Place 1 drop into both eyes at bedtime.   levothyroxine 75 MCG tablet Commonly known as: SYNTHROID Take 75 mcg by mouth See admin instructions. Take 75 mcg daily Monday through Saturday, skip Sunday dose   multivitamin with minerals tablet Take 1 tablet by mouth daily.   ondansetron 4 MG disintegrating tablet Commonly known as: ZOFRAN-ODT Take 1 tablet (4 mg total) by mouth every 8 (eight) hours as needed for up to 5 days.   oxyCODONE-acetaminophen 5-325 MG tablet Commonly known as: PERCOCET/ROXICET Take 1 tablet by mouth every 6 (six) hours as needed for up to 5 days.        Allergies:  Allergies  Allergen Reactions   Adhesive [Tape] Rash    Paper tape okay   Coumadin [Warfarin] Rash  Family History: Family History  Problem Relation Age of Onset   Heart failure Mother    Leukemia Father    Alcoholism Brother     Social History:  reports that he quit smoking about 21 years ago. His smoking use included cigarettes. He has a 46.50 pack-year smoking history. He has never used smokeless tobacco. He reports that he does not drink alcohol and does not use drugs.   Physical Exam: BP 138/80   Pulse 67   Ht '5\' 9"'$  (1.753 m)   Wt 178 lb (80.7 kg)   BMI 26.29 kg/m   Constitutional:  Alert and oriented, No acute distress. HEENT: [place] AT Respiratory: Normal respiratory effort, no increased work of breathing. Psychiatric: Normal mood and affect.  Laboratory Data:  Urinalysis Dipstick 2+ ketone/1+ blood/2+ protein Microscopy 11-30 RBC  Pertinent Imaging: CT images were personally reviewed and interpreted   Assessment & Plan:    1.  Left proximal ureteral calculus We discussed various treatment options for urolithiasis including observation with or without medical  expulsive therapy, shockwave lithotripsy (SWL), ureteroscopy and laser lithotripsy with stent placement. We discussed that management is based on stone size, location, density, patient co-morbidities, and patient preference.  Stones <99m in size have a >80% spontaneous passage rate. Data surrounding the use of tamsulosin for medical expulsive therapy is controversial, but meta analyses suggests it is most efficacious for distal stones between 5-17min size. Possible side effects include dizziness/lightheadedness, and retrograde ejaculation. SWL has a lower stone free rate in a single procedure, but also a lower complication rate compared to ureteroscopy and avoids a stent and associated stent related symptoms. Possible complications include renal hematoma, steinstrasse, and need for additional treatment. Ureteroscopy with laser lithotripsy and stent placement has a higher stone free rate than SWL in a single procedure, however increased complication rate including possible infection, ureteral injury, bleeding, and stent related morbidity. Common stent related symptoms include dysuria, urgency/frequency, and flank pain. After an extensive discussion of the risks and benefits of the above treatment options, the patient would like to proceed with ureteroscopic stone removal and will add onto my schedule this coming Tuesday Prescriptions for Phenergan and tamsulosin were sent to pharmacy He thinks he has enough pain medication to last over the weekend but was instructed to contact the office if a refill is needed.    ScAbbie Sons[address]Carlton[address] [id number] [phone]

## 2021-11-05 NOTE — Telephone Encounter (Signed)
I spoke with Mr. Barfield. We have discussed possible surgery dates and Tuesday September 19th, 2023  was agreed upon by all parties. Patient given information about surgery date, what to expect pre-operatively and post operatively.  We discussed that a Pre-Admission Testing office will be calling to set up the pre-op visit that will take place prior to surgery, and that these appointments are typically done over the phone with a Pre-Admissions RN.  Informed patient that our office will communicate any additional care to be provided after surgery. Patients questions or concerns were discussed during our call. Advised to call our office should there be any additional information, questions or concerns that arise. Patient verbalized understanding.

## 2021-11-05 NOTE — Progress Notes (Signed)
11/05/2021 4:51 PM   Bobby Flowers [birth year] [id number]  Referring provider: [name], MD [address] [facility] [place],  [place] [address]  Chief Complaint  Patient presents with   Nephrolithiasis    Urologic history: 1.  Nephrolithiasis Bilateral ureteroscopic stone removal by Dr. Gloriann Loan 07/2017 Small, nonobstructing left lower pole calculi   HPI: 71 y.o. male presents in follow-up of recent ED visit for renal colic.  Presented to [facility] ED 11/02/2021 with complaints of left flank pain radiating to the left side region Positive nausea/vomiting Denied fever, chills CT abdomen/pelvis with contrast was performed which showed moderate left hydronephrosis/hydroureter secondary to an 8 mm lower proximal ureteral calculus.  There were bilateral 2 mm lower pole nonobstructing calculi He received parenteral narcotic analgesics with pain improvement and was discharged on oxycodone and Zofran Was not prescribed tamsulosin Since ED visit he has had intermittent pain which is controlled with oxycodone Is having persistent nausea which does not improve with Zofran   PMH: Past Medical History:  Diagnosis Date   Chest tightness    a. 05/2017 Cath: nl cors.   Chronic pain of left lower extremity    right   COPD (chronic obstructive pulmonary disease) (HCC)    DDD (degenerative disc disease), lumbosacral    Dilated aortic root ([place])    a. 06/2017 Echo: Ao root 4.4cm.   Dysrhythmia    afib   Headache    migraines in past. none for 6-8 yrs.   History of kidney stones    Hypothyroidism    Mitral valve prolapse    a. 09/2015 Echo: EF 55-60%, no rwma, nl LA size, nl RV fxn, nl PASP; b. 06/2017 Echo: EF 55-60%, no rwma, Gr2 DD. Asc Ao 4.4cm. Mild MR (? mild prolapse). Mildly dil LA.   PAF (paroxysmal atrial fibrillation) ([place])    a. Dx in 1980's; b. CHA2DS2VASc = 1 - briefly on coumadin many years ago-->caused rash; c. Chronic Amio '200mg'$  QD.   Wears contact lenses      Surgical History: Past Surgical History:  Procedure Laterality Date   CARDIAC CATHETERIZATION     COLONOSCOPY  04/25/2005   COLONOSCOPY WITH PROPOFOL N/A 07/22/2015   Procedure: COLONOSCOPY WITH PROPOFOL;  Surgeon: Robert Bellow, MD;  Location: [facility];  Service: Endoscopy;  Laterality: N/A;   COLONOSCOPY WITH PROPOFOL N/A 09/04/2020   Procedure: COLONOSCOPY WITH PROPOFOL;  Surgeon: Robert Bellow, MD;  Location: [facility];  Service: Endoscopy;  Laterality: N/A;   CYSTOSCOPY WITH HOLMIUM LASER LITHOTRIPSY Bilateral 07/28/2017   Procedure: CYSTOSCOPY WITH HOLMIUM LASER LITHOTRIPSY;  Surgeon: Lucas Mallow, MD;  Location: [facility];  Service: Urology;  Laterality: Bilateral;   CYSTOSCOPY WITH STENT PLACEMENT Bilateral 07/28/2017   Procedure: CYSTOSCOPY WITH STENT PLACEMENT;  Surgeon: Lucas Mallow, MD;  Location: [facility];  Service: Urology;  Laterality: Bilateral;   CYSTOSCOPY WITH URETEROSCOPY Bilateral 07/28/2017   Procedure: CYSTOSCOPY WITH URETEROSCOPY;  Surgeon: Lucas Mallow, MD;  Location: [facility];  Service: Urology;  Laterality: Bilateral;   EYE SURGERY Left    cataract surgery   HEMORROIDECTOMY     HERNIA REPAIR Left 05/22/2015   Left inguinal hernia repair with medium Ultra Pro mesh   INGUINAL HERNIA REPAIR Left 07/22/2015   Procedure: HERNIA REPAIR INGUINAL ADULT;  Surgeon: Robert Bellow, MD;  Location: [facility];  Service: General;  Laterality: Left;   LEFT HEART CATH AND CORONARY ANGIOGRAPHY Left 05/29/2017   Procedure:  LEFT HEART CATH AND CORONARY ANGIOGRAPHY;  Surgeon: [name], MD;  Location: [place] [facility];  Service: Cardiovascular;  Laterality: Left;   LUMBAR FUSION N/A    LUMBAR LAMINECTOMY/DECOMPRESSION MICRODISCECTOMY Right 07/01/2019   Procedure: OPEN L4 LAMINECTOMY, L3/4 DISCECTOMY ON RIGHT, L4/5 DISCECTOMY;  Surgeon: Deetta Perla, MD;  Location: [facility];  Service: Neurosurgery;  Laterality: Right;    myringotomy     with tubes x 5.  tubes fall out frequently   MYRINGOTOMY WITH TUBE PLACEMENT Left 12/13/2018   Procedure: MYRINGOTOMY WITH T-TUBE PLACEMENT;  Surgeon: [name], MD;  Location: [place];  Service: ENT;  Laterality: Left;  needs to stay first case vaught / juengel doing case together   NASAL SINUS SURGERY     NASOPHARYNGOSCOPY EUSTATION TUBE BALLOON DILATION Left 12/13/2018   Procedure: NASOPHARYNGOSCOPY EUSTATION TUBE BALLOON DILATION WITH OUTFRACTURE OF TURBINATES;  Surgeon: [name], MD;  Location: [place];  Service: ENT;  Laterality: Left;   SPINE SURGERY N/A    TOTAL HIP ARTHROPLASTY Right 03/26/2021   Procedure: TOTAL HIP ARTHROPLASTY ANTERIOR APPROACH;  Surgeon: Hessie Knows, MD;  Location: [facility];  Service: Orthopedics;  Laterality: Right;   VASECTOMY      Home Medications:  Allergies as of 11/05/2021       Reactions   Adhesive [tape] Rash   Paper tape okay   Coumadin [warfarin] Rash        Medication List        Accurate as of November 05, 2021  4:51 PM. If you have any questions, ask your nurse or doctor.          STOP taking these medications    HYDROcodone-acetaminophen 7.5-325 MG tablet Commonly known as: NORCO Stopped by: Abbie Sons, MD       TAKE these medications    acetaminophen 500 MG tablet Commonly known as: TYLENOL Take 500 mg by mouth every 6 (six) hours as needed for moderate pain or headache.   albuterol 108 (90 Base) MCG/ACT inhaler Commonly known as: VENTOLIN HFA Inhale 2 puffs into the lungs every 6 (six) hours as needed for wheezing or shortness of breath.   amiodarone 200 MG tablet Commonly known as: PACERONE TAKE ONE TABLET BY MOUTH DAILY   aspirin EC 81 MG tablet Take 81 mg by mouth daily.   atenolol 25 MG tablet Commonly known as: TENORMIN TAKE ONE TABLET BY MOUTH DAILY   cholecalciferol 25 MCG (1000 UNIT) tablet Commonly known as: VITAMIN D3 Take 1,000 Units  by mouth daily.   Incruse Ellipta 62.5 MCG/ACT Aepb Generic drug: umeclidinium bromide Inhale 1 puff into the lungs daily.   latanoprost 0.005 % ophthalmic solution Commonly known as: XALATAN Place 1 drop into both eyes at bedtime.   levothyroxine 75 MCG tablet Commonly known as: SYNTHROID Take 75 mcg by mouth See admin instructions. Take 75 mcg daily Monday through Saturday, skip Sunday dose   multivitamin with minerals tablet Take 1 tablet by mouth daily.   ondansetron 4 MG disintegrating tablet Commonly known as: ZOFRAN-ODT Take 1 tablet (4 mg total) by mouth every 8 (eight) hours as needed for up to 5 days.   oxyCODONE-acetaminophen 5-325 MG tablet Commonly known as: PERCOCET/ROXICET Take 1 tablet by mouth every 6 (six) hours as needed for up to 5 days.        Allergies:  Allergies  Allergen Reactions   Adhesive [Tape] Rash    Paper tape okay   Coumadin [Warfarin] Rash  Family History: Family History  Problem Relation Age of Onset   Heart failure Mother    Leukemia Father    Alcoholism Brother     Social History:  reports that he quit smoking about 21 years ago. His smoking use included cigarettes. He has a 46.50 pack-year smoking history. He has never used smokeless tobacco. He reports that he does not drink alcohol and does not use drugs.   Physical Exam: BP 138/80   Pulse 67   Ht '5\' 9"'$  (1.753 m)   Wt 178 lb (80.7 kg)   BMI 26.29 kg/m   Constitutional:  Alert and oriented, No acute distress. HEENT: [place] AT Respiratory: Normal respiratory effort, no increased work of breathing. Psychiatric: Normal mood and affect.  Laboratory Data:  Urinalysis Dipstick 2+ ketone/1+ blood/2+ protein Microscopy 11-30 RBC  Pertinent Imaging: CT images were personally reviewed and interpreted   Assessment & Plan:    1.  Left proximal ureteral calculus We discussed various treatment options for urolithiasis including observation with or without medical  expulsive therapy, shockwave lithotripsy (SWL), ureteroscopy and laser lithotripsy with stent placement. We discussed that management is based on stone size, location, density, patient co-morbidities, and patient preference.  Stones <42m in size have a >80% spontaneous passage rate. Data surrounding the use of tamsulosin for medical expulsive therapy is controversial, but meta analyses suggests it is most efficacious for distal stones between 5-150min size. Possible side effects include dizziness/lightheadedness, and retrograde ejaculation. SWL has a lower stone free rate in a single procedure, but also a lower complication rate compared to ureteroscopy and avoids a stent and associated stent related symptoms. Possible complications include renal hematoma, steinstrasse, and need for additional treatment. Ureteroscopy with laser lithotripsy and stent placement has a higher stone free rate than SWL in a single procedure, however increased complication rate including possible infection, ureteral injury, bleeding, and stent related morbidity. Common stent related symptoms include dysuria, urgency/frequency, and flank pain. After an extensive discussion of the risks and benefits of the above treatment options, the patient would like to proceed with ureteroscopic stone removal and will add onto my schedule this coming Tuesday Prescriptions for Phenergan and tamsulosin were sent to pharmacy He thinks he has enough pain medication to last over the weekend but was instructed to contact the office if a refill is needed.    ScAbbie SonsMDNocona Hills[address] South Charles StreetSuZoaruAtascadero[place] [phone]

## 2021-11-05 NOTE — [place] Notes (Signed)
[facility] Posting Form   Surgery Date/Time: Date: 11/09/2021  Surgeon: Dr. John Giovanni, MD  Surgery Location: Day Surgery  Inpt ( No  )   Outpt (Yes)   Obs ( No  )   Diagnosis: N20.1 Left Ureteral Stone  -CPT: [phone]  Surgery: Left Ureteroscopy with laser lithotripsy and stent placement   Stop Anticoagulations: Yes, Hold ASA for 72 hours   Cardiac/Medical/Pulmonary Clearance needed: no  *Orders entered into EPIC  Date: 11/05/21   *Case booked in EPIC  Date: 11/05/21  *Notified pt of Surgery: Date: 11/05/21  PRE-OP UA & CX: yes, obtained in office today 09/15  *Placed into Prior Authorization Work Fabio Bering Date: 11/05/21   Assistant/laser/rep:No

## 2021-11-09 ENCOUNTER — Ambulatory Visit: Payer: Medicare HMO

## 2021-11-09 ENCOUNTER — Other Ambulatory Visit: Payer: Self-pay

## 2021-11-09 ENCOUNTER — Ambulatory Visit: Payer: Medicare HMO | Admitting: Anesthesiology

## 2021-11-09 ENCOUNTER — Ambulatory Visit
Admission: RE | Admit: 2021-11-09 | Discharge: 2021-11-09 | Disposition: A | Discharge status: Home / Self Care | Payer: Medicare HMO | Source: Ambulatory Visit | Attending: Urology | Admitting: Urology

## 2021-11-09 ENCOUNTER — Encounter: Payer: Self-pay | Admitting: Urology

## 2021-11-09 ENCOUNTER — Encounter
Admission: RE | Disposition: A | Discharge status: Home / Self Care | Payer: Self-pay | Source: Ambulatory Visit | Attending: Urology

## 2021-11-09 DIAGNOSIS — Z87891 Personal history of nicotine dependence: Secondary | ICD-10-CM | POA: Insufficient documentation

## 2021-11-09 DIAGNOSIS — N201 Calculus of ureter: Secondary | ICD-10-CM

## 2021-11-09 DIAGNOSIS — J449 Chronic obstructive pulmonary disease, unspecified: Secondary | ICD-10-CM | POA: Insufficient documentation

## 2021-11-09 DIAGNOSIS — Z9981 Dependence on supplemental oxygen: Secondary | ICD-10-CM | POA: Insufficient documentation

## 2021-11-09 DIAGNOSIS — E039 Hypothyroidism, unspecified: Secondary | ICD-10-CM | POA: Diagnosis not present

## 2021-11-09 DIAGNOSIS — I4891 Unspecified atrial fibrillation: Secondary | ICD-10-CM | POA: Diagnosis not present

## 2021-11-09 DIAGNOSIS — N132 Hydronephrosis with renal and ureteral calculous obstruction: Secondary | ICD-10-CM | POA: Diagnosis present

## 2021-11-09 DIAGNOSIS — I4819 Other persistent atrial fibrillation: Secondary | ICD-10-CM

## 2021-11-09 HISTORY — PX: CYSTOSCOPY/URETEROSCOPY/HOLMIUM LASER/STENT PLACEMENT: SHX6546

## 2021-11-09 SURGERY — CYSTOSCOPY/URETEROSCOPY/HOLMIUM LASER/STENT PLACEMENT
Anesthesia: General | Site: Ureter | Laterality: Left

## 2021-11-09 MED ORDER — CHLORHEXIDINE GLUCONATE 0.12 % MT SOLN
OROMUCOSAL | Status: AC
  Administered 2021-11-09: 15 mL via OROMUCOSAL
  Filled 2021-11-09: qty 15

## 2021-11-09 MED ORDER — DEXAMETHASONE SODIUM PHOSPHATE 10 MG/ML IJ SOLN
INTRAMUSCULAR | Status: DC | PRN
  Administered 2021-11-09: 10 mg via INTRAVENOUS

## 2021-11-09 MED ORDER — FENTANYL CITRATE (PF) 100 MCG/2ML IJ SOLN
INTRAMUSCULAR | Status: DC | PRN
  Administered 2021-11-09 (×4): 25 ug via INTRAVENOUS

## 2021-11-09 MED ORDER — PHENYLEPHRINE HCL (PRESSORS) 10 MG/ML IV SOLN
INTRAVENOUS | Status: DC | PRN
  Administered 2021-11-09 (×2): 80 ug via INTRAVENOUS

## 2021-11-09 MED ORDER — ACETAMINOPHEN 10 MG/ML IV SOLN: 1000.0000 mg | Freq: Once | INTRAVENOUS | Status: DC | PRN

## 2021-11-09 MED ORDER — LACTATED RINGERS IV SOLN: INTRAVENOUS | Status: DC

## 2021-11-09 MED ORDER — IOHEXOL 180 MG/ML  SOLN
INTRAMUSCULAR | Status: DC | PRN
  Administered 2021-11-09: 10 mL

## 2021-11-09 MED ORDER — FENTANYL CITRATE (PF) 100 MCG/2ML IJ SOLN
INTRAMUSCULAR | Status: AC
  Filled 2021-11-09: qty 2

## 2021-11-09 MED ORDER — OXYCODONE HCL 5 MG PO TABS: 5.0000 mg | ORAL_TABLET | Freq: Once | ORAL | Status: DC | PRN

## 2021-11-09 MED ORDER — PROPOFOL 10 MG/ML IV BOLUS
INTRAVENOUS | Status: DC | PRN
  Administered 2021-11-09: 140 mg via INTRAVENOUS

## 2021-11-09 MED ORDER — CHLORHEXIDINE GLUCONATE 0.12 % MT SOLN: 15.0000 mL | Freq: Once | OROMUCOSAL | Status: AC

## 2021-11-09 MED ORDER — ORAL CARE MOUTH RINSE: 15.0000 mL | Freq: Once | OROMUCOSAL | Status: AC

## 2021-11-09 MED ORDER — CEFAZOLIN SODIUM-DEXTROSE 2-4 GM/100ML-% IV SOLN
INTRAVENOUS | Status: AC
  Filled 2021-11-09: qty 100

## 2021-11-09 MED ORDER — ONDANSETRON HCL 4 MG/2ML IJ SOLN
INTRAMUSCULAR | Status: DC | PRN
  Administered 2021-11-09: 4 mg via INTRAVENOUS

## 2021-11-09 MED ORDER — OXYCODONE HCL 5 MG/5ML PO SOLN: 5.0000 mg | Freq: Once | ORAL | Status: DC | PRN

## 2021-11-09 MED ORDER — EPHEDRINE SULFATE (PRESSORS) 50 MG/ML IJ SOLN
INTRAMUSCULAR | Status: DC | PRN
  Administered 2021-11-09 (×2): 10 mg via INTRAVENOUS

## 2021-11-09 MED ORDER — LIDOCAINE HCL (CARDIAC) PF 100 MG/5ML IV SOSY
PREFILLED_SYRINGE | INTRAVENOUS | Status: DC | PRN
  Administered 2021-11-09: 80 mg via INTRAVENOUS

## 2021-11-09 MED ORDER — PROPOFOL 10 MG/ML IV BOLUS
INTRAVENOUS | Status: AC
  Filled 2021-11-09: qty 20

## 2021-11-09 MED ORDER — FENTANYL CITRATE (PF) 100 MCG/2ML IJ SOLN: 25.0000 ug | INTRAMUSCULAR | Status: DC | PRN

## 2021-11-09 MED ORDER — ONDANSETRON HCL 4 MG/2ML IJ SOLN: 4.0000 mg | Freq: Once | INTRAMUSCULAR | Status: DC | PRN

## 2021-11-09 MED ORDER — CEFAZOLIN SODIUM-DEXTROSE 2-4 GM/100ML-% IV SOLN
2.0000 g | INTRAVENOUS | Status: AC
  Administered 2021-11-09: 2 g via INTRAVENOUS

## 2021-11-09 MED ORDER — PHENYLEPHRINE 80 MCG/ML (10ML) SYRINGE FOR IV PUSH (FOR BLOOD PRESSURE SUPPORT)
PREFILLED_SYRINGE | INTRAVENOUS | Status: AC
  Filled 2021-11-09: qty 20

## 2021-11-09 MED ORDER — KETOROLAC TROMETHAMINE 30 MG/ML IJ SOLN
INTRAMUSCULAR | Status: DC | PRN
  Administered 2021-11-09: 30 mg via INTRAVENOUS

## 2021-11-09 MED ORDER — GLYCOPYRROLATE 0.2 MG/ML IJ SOLN
INTRAMUSCULAR | Status: DC | PRN
  Administered 2021-11-09: .2 mg via INTRAVENOUS

## 2021-11-09 MED ORDER — HYDROCODONE-ACETAMINOPHEN 5-325 MG PO TABS: 1.0000 | ORAL_TABLET | ORAL | 0 refills | Status: DC | PRN

## 2021-11-09 SURGICAL SUPPLY — 30 items
BAG DRAIN SIEMENS DORNER NS (MISCELLANEOUS) ×1 IMPLANT
BAG DRN NS LF (MISCELLANEOUS) ×1
BASKET ZERO TIP 1.9FR (BASKET) IMPLANT
BRUSH SCRUB EZ 1% IODOPHOR (MISCELLANEOUS) ×1 IMPLANT
BSKT STON RTRVL ZERO TP 1.9FR (BASKET) ×1
CATH URET FLEX-TIP 2 LUMEN 10F (CATHETERS) IMPLANT
CATH URETL OPEN END 6X70 (CATHETERS) IMPLANT
CNTNR SPEC 2.5X3XGRAD LEK (MISCELLANEOUS)
CONT SPEC 4OZ STER OR WHT (MISCELLANEOUS)
CONT SPEC 4OZ STRL OR WHT (MISCELLANEOUS)
CONTAINER SPEC 2.5X3XGRAD LEK (MISCELLANEOUS) IMPLANT
DRAPE UTILITY 15X26 TOWEL STRL (DRAPES) ×1 IMPLANT
FIBER LASER MOSES 200 DFL (Laser) IMPLANT
GLOVE SURG UNDER POLY LF SZ7.5 (GLOVE) ×1 IMPLANT
GOWN STRL REUS W/ TWL LRG LVL3 (GOWN DISPOSABLE) ×1 IMPLANT
GOWN STRL REUS W/ TWL XL LVL3 (GOWN DISPOSABLE) ×1 IMPLANT
GOWN STRL REUS W/TWL LRG LVL3 (GOWN DISPOSABLE) ×1
GOWN STRL REUS W/TWL XL LVL3 (GOWN DISPOSABLE) ×1
GUIDEWIRE STR DUAL SENSOR (WIRE) ×1 IMPLANT
IV NS IRRIG 3000ML ARTHROMATIC (IV SOLUTION) ×1 IMPLANT
KIT TURNOVER CYSTO (KITS) ×1 IMPLANT
PACK CYSTO AR (MISCELLANEOUS) ×1 IMPLANT
SET CYSTO W/LG BORE CLAMP LF (SET/KITS/TRAYS/PACK) ×1 IMPLANT
SHEATH NAVIGATOR HD 12/14X36 (SHEATH) IMPLANT
STENT URET 6FRX24 CONTOUR (STENTS) IMPLANT
STENT URET 6FRX26 CONTOUR (STENTS) IMPLANT
SURGILUBE 2OZ TUBE FLIPTOP (MISCELLANEOUS) ×1 IMPLANT
TRAP FLUID SMOKE EVACUATOR (MISCELLANEOUS) ×1 IMPLANT
VALVE UROSEAL ADJ ENDO (VALVE) IMPLANT
WATER STERILE IRR 500ML POUR (IV SOLUTION) ×1 IMPLANT

## 2021-11-09 NOTE — Interval H&P Note (Signed)
History and Physical Interval Note:  CV: RRR Lungs: Clear  11/09/2021 4:40 PM  Bobby Flowers  has presented today for surgery, with the diagnosis of Left Ureteral Stone.  The various methods of treatment have been discussed with the patient and family. After consideration of risks, benefits and other options for treatment, the patient has consented to  Procedure(s): CYSTOSCOPY/URETEROSCOPY/HOLMIUM LASER/STENT PLACEMENT (Left) as a surgical intervention.  The patient's history has been reviewed, patient examined, no change in status, stable for surgery.  I have reviewed the patient's chart and labs.  Questions were answered to the patient's satisfaction.     [place]

## 2021-11-09 NOTE — Transfer of Care (Signed)
Immediate Anesthesia Transfer of Care Note  Patient: Bobby Flowers  Procedure(s) Performed: CYSTOSCOPY/URETEROSCOPY/HOLMIUM LASER/STENT PLACEMENT (Left: Ureter)  Patient Location: PACU  Anesthesia Type:General  Level of Consciousness: awake, alert  and oriented  Airway & Oxygen Therapy: Patient Spontanous Breathing and Patient connected to face mask oxygen  Post-op Assessment: Report given to RN and Post -op Vital signs reviewed and stable  Post vital signs: Reviewed and stable  Last Vitals:  Vitals Value Taken Time  BP 148/72 11/09/21 1806  Temp    Pulse    Resp 22 11/09/21 1808  SpO2    Vitals shown include unvalidated device data.  Last Pain:  Vitals:   11/09/21 1406  PainSc: 7       Patients Stated Pain Goal: 3 (28/41/32 4401)  Complications: No notable events documented.

## 2021-11-09 NOTE — Anesthesia Preprocedure Evaluation (Addendum)
Anesthesia Evaluation  Patient identified by MRN, date of birth, ID band Patient awake    Reviewed: Allergy & Precautions, NPO status , Patient's Chart, lab work & pertinent test results  History of Anesthesia Complications Negative for: history of anesthetic complications  Airway Mallampati: II  TM Distance: >3 FB Neck ROM: Full    Dental no notable dental hx.    Pulmonary COPD, former smoker (quit 2002),  Sleeps with 1L oxygen   Pulmonary exam normal breath sounds clear to auscultation       Cardiovascular (-) anginaNormal cardiovascular exam+ dysrhythmias (a fib) Atrial Fibrillation + Valvular Problems/Murmurs MVP  Rhythm:Regular  Ascending thoracic aortic aneurysm, stable at 4.5 cm in 09/2020  ECG 02/24/21: SB with 1st degree AVB   Neuro/Psych  Headaches,  Neuromuscular disease    GI/Hepatic negative GI ROS, Neg liver ROS,   Endo/Other  Hypothyroidism   Renal/GU Renal disease (nephrolithiasis) Stones     Musculoskeletal  (+) Arthritis ,   Abdominal   Peds  Hematology negative hematology ROS (+)   Anesthesia Other Findings Cardiology note 02/24/21:  In order of problems listed above:  #Persistent atrial fibrillation #High risk medication monitoring  Maintaining sinus rhythm on chronic amiodarone.  The patient would like to continue with amiodarone therapy.  We have previously discussed the risks associated with the drug.  During today's appointment we discussed the need for ongoing monitoring of his liver and thyroid function.  We will check a CMP, TSH and free T4.  He is not currently taking anticoagulation.  We discussed his risk of stroke during today's appointment.  His CHA2DS2-VASc is 1.  We discussed how his risk of stroke will increase as he ages or develops other comorbidities.  I do think he continues to be an ablation candidate.  Ablation would offer a mechanism to avoid long-term exposure to amiodarone.   This is previously been discussed with the patient.  We could revisit this question in the future should he decide to discontinue amiodarone.  Follow-up with [facility]/Dr. Fletcher Anon in 6 months.  He needs CMP, TSH, free T4 every 6 months while on chronic amiodarone therapy.  He needs annual eye exams and PFTs.    Reproductive/Obstetrics                            Anesthesia Physical Anesthesia Plan  ASA: 3  Anesthesia Plan: General   Post-op Pain Management: Minimal or no pain anticipated   Induction: Intravenous  PONV Risk Score and Plan: 2 and Ondansetron, Dexamethasone and Treatment may vary due to age or medical condition  Airway Management Planned: LMA  Additional Equipment:   Intra-op Plan:   Post-operative Plan: Extubation in OR  Informed Consent: I have reviewed the patients History and Physical, chart, labs and discussed the procedure including the risks, benefits and alternatives for the proposed anesthesia with the patient or authorized representative who has indicated his/her understanding and acceptance.     Dental Advisory Given  Plan Discussed with: Anesthesiologist, CRNA and Surgeon  Anesthesia Plan Comments: (Patient consented for risks of anesthesia including but not limited to:  - adverse reactions to medications - damage to eyes, teeth, lips or other oral mucosa - nerve damage due to positioning  - sore throat or hoarseness - Damage to heart, brain, nerves, lungs, other parts of body or loss of life  Patient voiced understanding.)      Anesthesia Quick Evaluation

## 2021-11-09 NOTE — Anesthesia Postprocedure Evaluation (Signed)
Anesthesia Post Note  Patient: IRWIN TORAN  Procedure(s) Performed: CYSTOSCOPY/URETEROSCOPY/HOLMIUM LASER/STENT PLACEMENT (Left: Ureter)  Patient location during evaluation: PACU Anesthesia Type: General Level of consciousness: awake and alert Pain management: pain level controlled Vital Signs Assessment: post-procedure vital signs reviewed and stable Respiratory status: spontaneous breathing, nonlabored ventilation, respiratory function stable and patient connected to nasal cannula oxygen Cardiovascular status: blood pressure returned to baseline and stable Postop Assessment: no apparent nausea or vomiting Anesthetic complications: no   No notable events documented.   Last Vitals:  Vitals:   11/09/21 1406 11/09/21 1806  BP: 139/85 (!) 148/72  Pulse: (!) 58 66  Resp: 16 14  Temp:  (!) 36.1 C  SpO2: 100% 96%    Last Pain:  Vitals:   11/09/21 1806  PainSc: 0-No pain                 Molli Barrows

## 2021-11-09 NOTE — Op Note (Signed)
Preoperative diagnosis:  Left proximal ureteral calculus  Postoperative diagnosis:  Left mid ureteral calculus  Procedure:  Cystoscopy Left ureteroscopy and stone removal Ureteroscopic laser lithotripsy Left ureteral stent placement (49F/24 cm)  Left retrograde pyelography with interpretation  Surgeon: Nicki Reaper C. Jester Klingberg, M.D.  Anesthesia: General  Complications: None  Intraoperative findings: Cystoscopy-urethra normal in caliber without stricture.  Moderate lateral lobe enlargement with small median lobe.  Bladder mucosa without erythema, solid or papillary lesions.  Mild trabeculation.  UOs normal-appearing bilaterally Left ureteroscopy-mid ureteral calculus with mild-moderate inflammatory changes ureteral mucosa Left retrograde pyelography post procedure showed no filling defects, stone fragments or contrast extravasation  EBL: Minimal  Specimens: Calculus fragments for analysis   Indication: Bobby Flowers is a 71 y.o. seen in the ED last week with an 8 mm left lower proximal ureteral calculus.  After discussing treatment options he requested ureteroscopic removal. After reviewing the management options for treatment, the patient elected to proceed with the above surgical procedure(s). We have discussed the potential benefits and risks of the procedure, side effects of the proposed treatment, the likelihood of the patient achieving the goals of the procedure, and any potential problems that might occur during the procedure or recuperation. Informed consent has been obtained.  Description of procedure:  The patient was taken to the operating room and general anesthesia was induced.  The patient was placed in the dorsal lithotomy position, prepped and draped in the usual sterile fashion, and preoperative antibiotics were administered. A preoperative time-out was performed.   A 21 French cystoscope was lubricated inserted per urethra and advanced proximally under direct vision with  findings described above.   Attention was directed to the left ureteral orifice and a 0.038 Sensor wire was then advanced up the ureter into the renal pelvis under fluoroscopic guidance.  A 4.5 Fr semirigid ureteroscope was then advanced into the ureter next to the guidewire and the calculus was identified as described above.  The stone was then just with a 200 micron holmium laser fiber on a setting of 0.3J/40 Hz  All fragments >1 mm in size were then removed from the ureter with a zero tip nitinol basket.  Reinspection of the ureter revealed no remaining visible stones or fragments.   Retrograde pyelogram was performed with findings as described above.  A 49F/24 cm Contour ureteral stent was placed under fluoroscopic guidance.  The wire was then removed with an adequate stent curl noted in the bladder.  The proximal curl was in the upper pole calyx.  The bladder was then emptied and the procedure ended.  The patient appeared to tolerate the procedure well and without complications.  After anesthetic reversal the patient was transported to the PACU in stable condition.   Plan: Stent removal in office ~ 10 days   John Giovanni, MD

## 2021-11-09 NOTE — Anesthesia Procedure Notes (Signed)
Procedure Name: LMA Insertion Date/Time: 11/09/2021 5:19 PM  Performed by: Nelda Marseille, CRNAPre-anesthesia Checklist: Patient identified, Patient being monitored, Timeout performed, Emergency Drugs available and Suction available Patient Re-evaluated:Patient Re-evaluated prior to induction Oxygen Delivery Method: Circle system utilized Preoxygenation: Pre-oxygenation with 100% oxygen Induction Type: IV induction Ventilation: Mask ventilation without difficulty LMA: LMA inserted LMA Size: 4.0 Tube type: Oral Number of attempts: 1 Placement Confirmation: positive ETCO2 and breath sounds checked- equal and bilateral Tube secured with: Tape Dental Injury: Teeth and Oropharynx as per pre-operative assessment

## 2021-11-09 NOTE — Discharge Instructions (Addendum)
AMBULATORY SURGERY  DISCHARGE INSTRUCTIONS   The drugs that you were given will stay in your system until tomorrow so for the next 24 hours you should not:  Drive an automobile Make any legal decisions Drink any alcoholic beverage   You may resume regular meals tomorrow.  Today it is better to start with liquids and gradually work up to solid foods.  You may eat anything you prefer, but it is better to start with liquids, then soup and crackers, and gradually work up to solid foods.   Please notify your doctor immediately if you have any unusual bleeding, trouble breathing, redness and pain at the surgery site, drainage, fever, or pain not relieved by medication.    Additional Instructions:  DISCHARGE INSTRUCTIONS FOR KIDNEY STONE/URETERAL STENT   MEDICATIONS:  1. Resume all your other meds from home.  2.  AZO (over-the-counter) can help with the burning/stinging when you urinate. 3.  Hydrocodone is for moderate/severe pain, Rx was sent to your pharmacy. 4.  Continue tamsulosin which will help with bladder irritation  ACTIVITY:  1. May resume regular activities in 24 hours. 2. No driving while on narcotic pain medications  3. Drink plenty of water  4. Continue to walk at home - you can still get blood clots when you are at home, so keep active, but don't over do it.  5. May return to work/school tomorrow or when you feel ready    SIGNS/SYMPTOMS TO CALL:  Common postoperative symptoms include urinary frequency, urgency, bladder spasm and blood in the urine  Please call us if you have a fever greater than 101.5, uncontrolled nausea/vomiting, uncontrolled pain, dizziness, unable to urinate, excessively bloody urine, chest pain, shortness of breath, leg swelling, leg pain, or any other concerns or questions.   You can reach [place] at [phone].   FOLLOW-UP:  1. You would be contacted by our office for an appointment for stent removal in ~ 10 days        Please contact  your physician with any problems or Same Day Surgery at [phone], Monday through Friday 6 am to 4 pm, or [place] at [facility] number at [phone]. AMBULATORY SURGERY  DISCHARGE INSTRUCTIONS   The drugs that you were given will stay in your system until tomorrow so for the next 24 hours you should not:  Drive an automobile Make any legal decisions Drink any alcoholic beverage   You may resume regular meals tomorrow.  Today it is better to start with liquids and gradually work up to solid foods.  You may eat anything you prefer, but it is better to start with liquids, then soup and crackers, and gradually work up to solid foods.   Please notify your doctor immediately if you have any unusual bleeding, trouble breathing, redness and pain at the surgery site, drainage, fever, or pain not relieved by medication.    Additional Instructions:        Please contact your physician with any problems or Same Day Surgery at [phone], Monday through Friday 6 am to 4 pm, or [place] at [facility] number at [phone].

## 2021-11-10 ENCOUNTER — Encounter: Payer: Self-pay | Admitting: Urology

## 2021-11-11 LAB — CULTURE, URINE COMPREHENSIVE

## 2021-11-17 LAB — CALCULI, WITH PHOTOGRAPH (CLINICAL LAB)
Uric Acid Calculi: 100 %
Weight Calculi: 5 mg

## 2021-11-19 ENCOUNTER — Encounter: Payer: Medicare HMO | Admitting: Urology

## 2021-11-22 ENCOUNTER — Encounter: Payer: Self-pay | Admitting: Urology

## 2021-11-22 ENCOUNTER — Ambulatory Visit (INDEPENDENT_AMBULATORY_CARE_PROVIDER_SITE_OTHER): Payer: Medicare HMO | Admitting: Urology

## 2021-11-22 VITALS — BP 138/84 | HR 50 | Ht 68.0 in | Wt 178.0 lb

## 2021-11-22 DIAGNOSIS — Z9889 Other specified postprocedural states: Secondary | ICD-10-CM

## 2021-11-22 DIAGNOSIS — Z466 Encounter for fitting and adjustment of urinary device: Secondary | ICD-10-CM | POA: Diagnosis not present

## 2021-11-22 MED ORDER — CIPROFLOXACIN HCL 500 MG PO TABS
500.0000 mg | ORAL_TABLET | Freq: Once | ORAL | Status: AC
  Administered 2021-11-22: 500 mg via ORAL

## 2021-11-22 NOTE — Progress Notes (Signed)
Indications: Patient is 71 y.o., who is s/p ureteroscopic removal of an 8 mm left mid ureteral calculus 11/09/2021.  He had no postoperative problems.  The patient is presenting today for stent removal.  Procedure:  Flexible Cystoscopy with stent removal (99371)  Timeout was performed and the correct patient, procedure and participants were identified.    Description:  The patient was prepped and draped in the usual sterile fashion. Flexible cystosopy was performed.  The stent was visualized, grasped, and removed intact without difficulty. The patient tolerated the procedure well.  A single dose of oral antibiotics was given.  Complications:  None  Plan:  Call for fever/flank pain post stent removal Stone analysis 100% uric acid; recommend metabolic evaluation to include 24-hour urine study and blood work 61-monthfollow-up with [facility]

## 2021-11-22 NOTE — Addendum Note (Signed)
Addended by: Chrystie Nose on: 11/22/2021 02:22 PM   Modules accepted: Orders

## 2021-11-22 NOTE — Patient Instructions (Signed)

## 2021-12-03 NOTE — Telephone Encounter (Signed)
Opened in error

## 2021-12-17 ENCOUNTER — Other Ambulatory Visit: Payer: Self-pay | Admitting: Internal Medicine

## 2022-01-06 IMAGING — MR MR LUMBAR SPINE W/O CM
4 of 5 series · 29 of 48 positions shown · non-contrast
Comparison: Prior MRI from 11/02/2017.

CLINICAL DATA: Initial evaluation for low back pain with radiation
into the lower extremities bilaterally with associated weakness for
1 month.

EXAM:
MRI LUMBAR SPINE WITHOUT CONTRAST
TECHNIQUE: Multiplanar, multisequence MR imaging of the lumbar spine was
performed. No intravenous contrast was administered.

[Series 5: T2 · sagittal · 4.0mm · 0.81mm/px · 6 of 17 slices shown (1 of 2)]
[im 1/17]
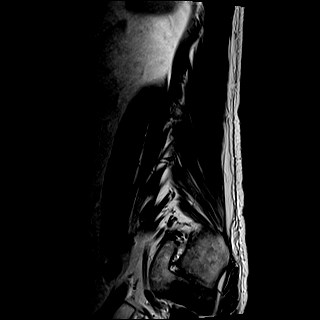
[im 4/17]
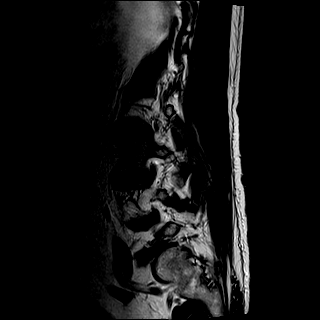
[im 7/17]
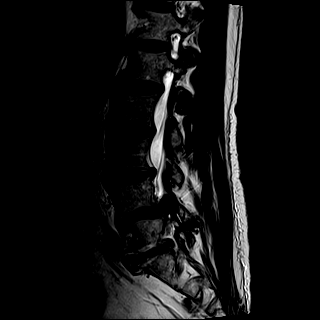
[im 10/17]
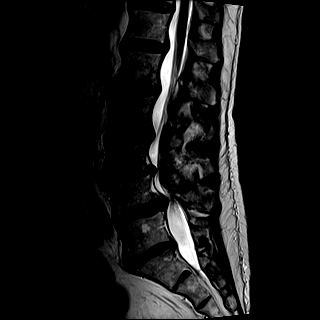
[im 13/17]
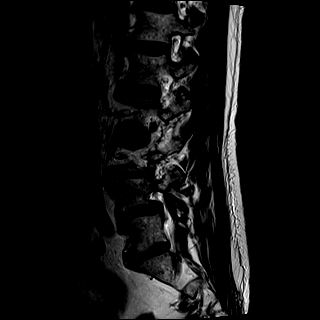
[im 17/17]
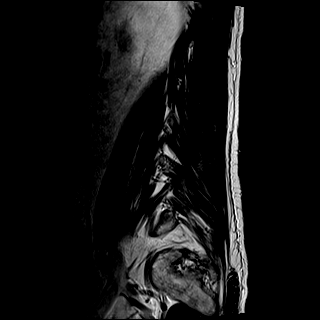

[Series 6: T1 · sagittal · 4.0mm · 0.81mm/px · 7 of 17 slices shown (1 of 2)]
[im 1/17]
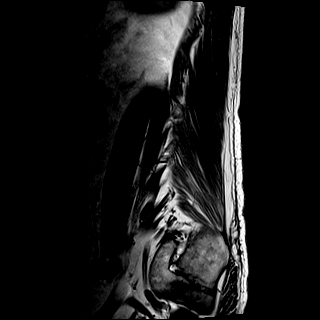
[im 3/17]
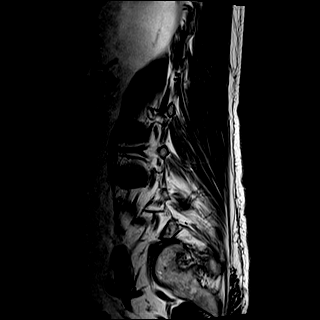
[im 6/17]
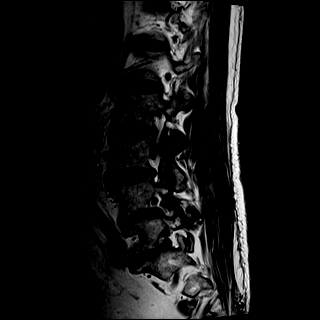
[im 9/17]
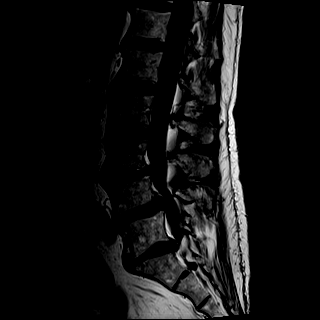
[im 11/17]
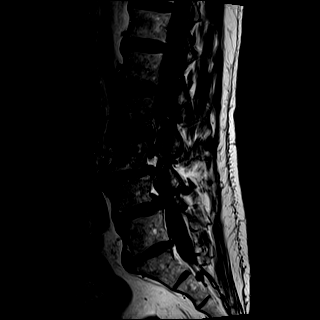
[im 14/17]
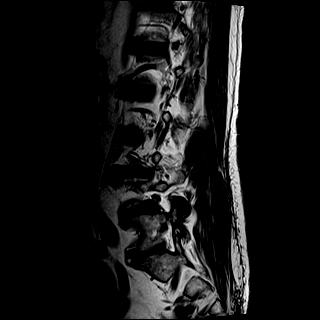
[im 17/17]
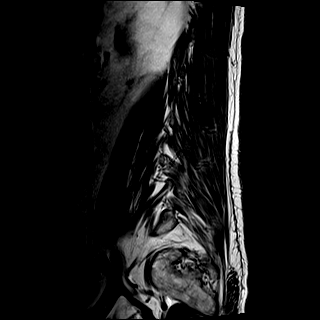

[Series 8: T2 · axial · 4.0mm · 0.78mm/px · z∈[-122,+74]mm · 8 of 36 slices shown (2 of 2)]
[im 1/36]
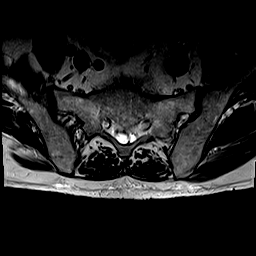
[im 6/36]
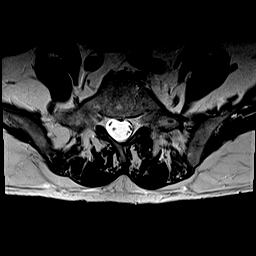
[im 11/36]
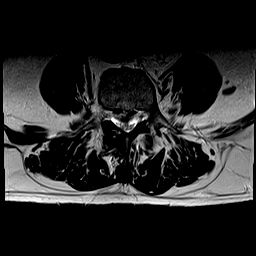
[im 17/36]
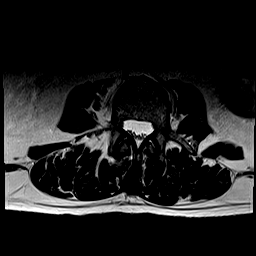
[im 19/36]
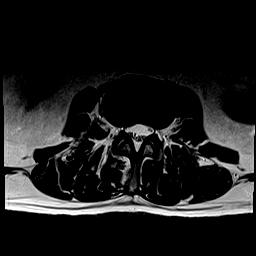
[im 25/36]
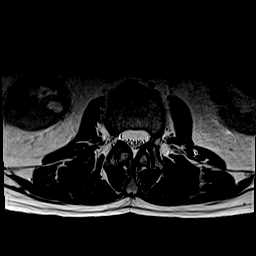
[im 30/36]
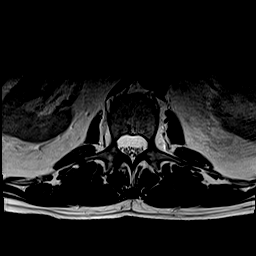
[im 36/36]
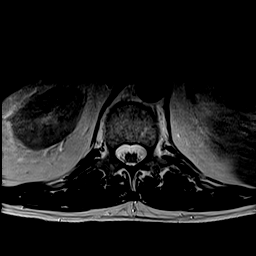

[Series 9: T1 · axial · 4.0mm · 0.39mm/px · z∈[-122,+74]mm · 8 of 36 slices shown (2 of 2)]
[im 1/36]
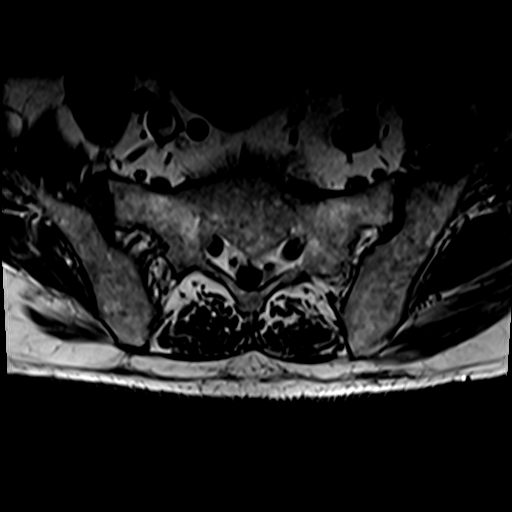
[im 6/36]
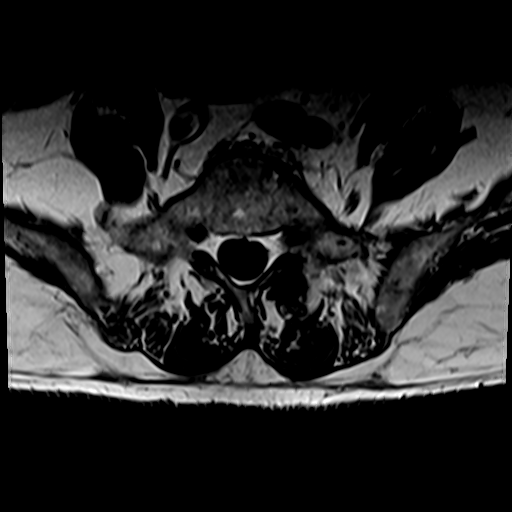
[im 11/36]
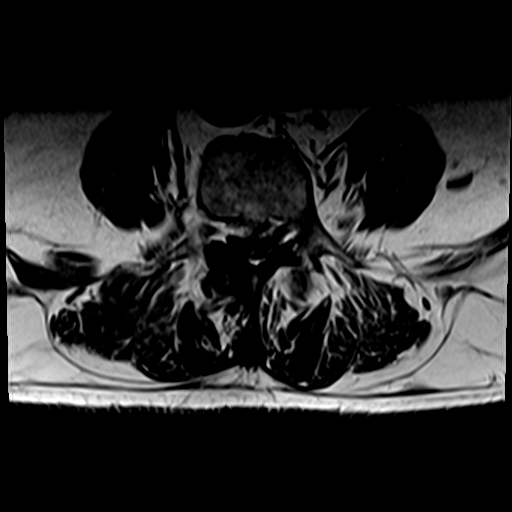
[im 17/36]
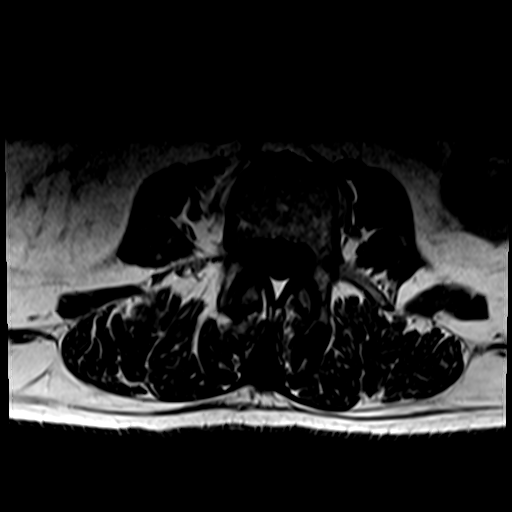
[im 19/36]
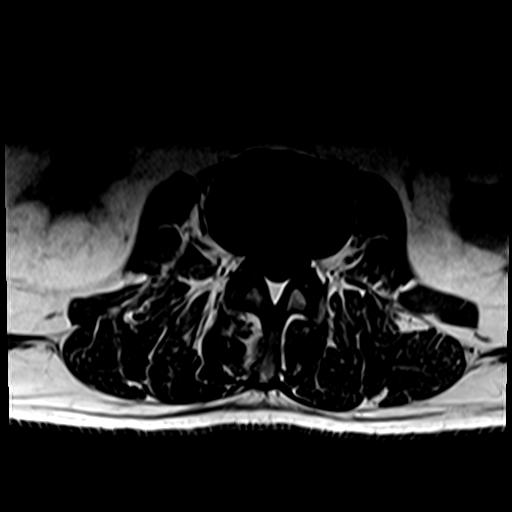
[im 25/36]
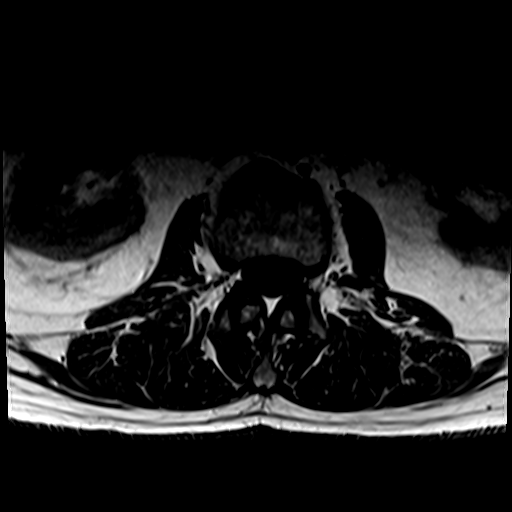
[im 30/36]
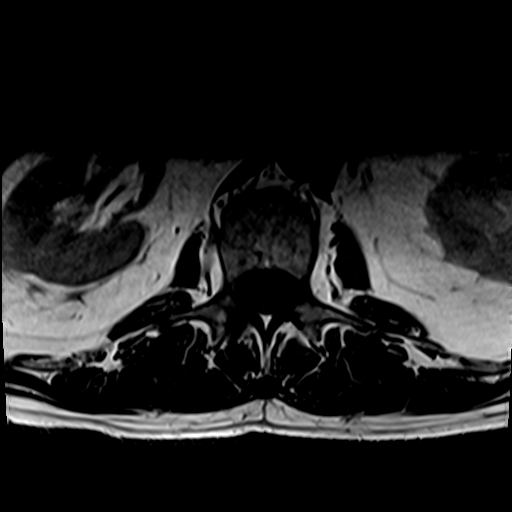
[im 36/36]
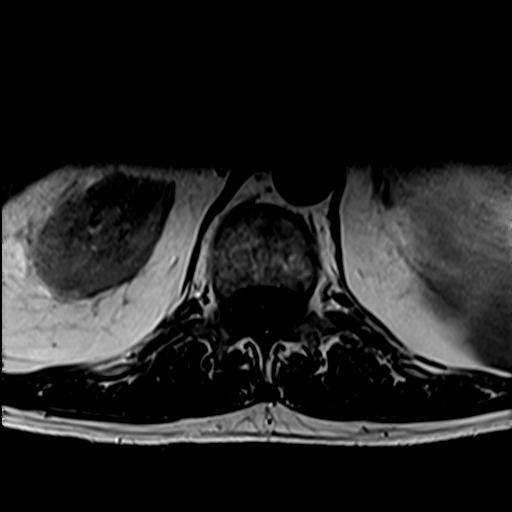

[29 of 48 positions shown; findings below may reference images not displayed]

FINDINGS: Segmentation: Standard. Lowest well-formed disc space labeled the
L5-S1 level.

Alignment: Mild levoscoliosis. 3 mm anterolisthesis of L3 on L4,
with 7 mm anterolisthesis of L4 on L5. Trace 2 mm retrolisthesis of
L5 on S1. Findings chronic and facet mediated, and similar to
previous.

Vertebrae: Vertebral body height maintained without evidence for
acute or chronic fracture. Bone marrow signal intensity mildly
heterogeneous but within normal limits. Subcentimeter benign
hemangioma noted within the L5 vertebral body. No worrisome osseous
lesions. No abnormal marrow edema.

Conus medullaris and cauda equina: Conus extends to the L1 level.
Conus and cauda equina appear normal.

Paraspinal and other soft tissues: Paraspinous soft tissues within
normal limits. 12 mm simple T2 hyperintense cyst noted within the
right kidney. Visualized visceral structures otherwise unremarkable.

Disc levels:

T12-L1: Mild diffuse disc bulge with disc desiccation. No
significant canal or foraminal stenosis.

L1-2: Mild diffuse disc bulge with disc desiccation. Mild facet
hypertrophy. No significant spinal stenosis. Foramina remain patent.

L2-3: Mild diffuse disc bulge with disc desiccation. Superimposed
shallow right subarticular to foraminal disc protrusion (series 8,
image 18). Resultant mild narrowing of the right lateral recess.
Central canal remains patent. No significant foraminal stenosis.

L3-4: Anterolisthesis. Chronic intervertebral disc space narrowing
with diffuse disc bulge and disc desiccation. Disc bulges centric to
the right with associated prominent right-sided reactive endplate
changes. Moderate facet hypertrophy. Resultant mild to moderate
right lateral recess stenosis as well as right foraminal narrowing.
Either the right L3 or descending L4 nerve roots could be affected.
Central canal remains patent. No significant left foraminal
stenosis. Appearance is similar to previous.

L4-5: Anterolisthesis. Diffuse disc bulge with disc desiccation.
Disc bulging asymmetric to the left. Superimposed fairly large
central disc protrusion with slight superior migration. Moderate
left worse than right facet degeneration with associated trace
bilateral joint effusions. Resultant severe canal with severe left
worse than right lateral recess stenosis, similar to previous. Mild
to moderate left with mild right L4 foraminal narrowing.

L5-S1: Diffuse disc bulge with disc desiccation and intervertebral
disc space narrowing. Superimposed broad-based left subarticular
disc protrusion extends into the left lateral recess, contacting and
displacing the descending left S1 nerve root. Mild left lateral
recess stenosis. Central canal remains patent. Mild left foraminal
narrowing. Right neural foramen remains widely patent. Appearance is
relatively stable.
IMPRESSION: 1. Overall, little interval change in appearance of the lumbar spine
as compared to 4066.
2. Disc bulge with large central disc protrusion and facet
hypertrophy at L4-5 with resultant severe canal with left worse than
right lateral recess stenosis. Either of the descending L5 nerve
roots could be affected.
3. Right eccentric disc bulge with reactive endplate changes at L3-4
with resultant mild to moderate right lateral recess and foraminal
stenosis, potentially affecting either the right L3 or descending L4
nerve roots.
4. Left subarticular disc protrusion at L5-S1, contacting and
displacing the descending left S1 nerve root.

## 2022-01-09 ENCOUNTER — Other Ambulatory Visit: Payer: Self-pay | Admitting: Urology

## 2022-01-10 ENCOUNTER — Other Ambulatory Visit: Payer: Medicare HMO

## 2022-01-10 DIAGNOSIS — Z466 Encounter for fitting and adjustment of urinary device: Secondary | ICD-10-CM

## 2022-01-11 LAB — LITHOLINK SERUM PANEL
CO2: 27 mmol/L (ref 20–29)
Calcium: 9.4 mg/dL (ref 8.6–10.2)
Chloride: 104 mmol/L (ref 96–106)
Creatinine, Ser: 0.97 mg/dL (ref 0.76–1.27)
Magnesium: 2.2 mg/dL (ref 1.6–2.3)
Phosphorus: 3.1 mg/dL (ref 2.8–4.1)
Potassium: 4.4 mmol/L (ref 3.5–5.2)
Sodium: 144 mmol/L (ref 134–144)
Uric Acid: 3.9 mg/dL (ref 3.8–8.4)
eGFR: 84 mL/min/{1.73_m2} (ref >59)

## 2022-01-19 LAB — LITHOLINK 24HR URINE PANEL

## 2022-01-21 ENCOUNTER — Other Ambulatory Visit: Payer: Self-pay | Admitting: Family Medicine

## 2022-01-21 DIAGNOSIS — N201 Calculus of ureter: Secondary | ICD-10-CM

## 2022-02-07 ENCOUNTER — Other Ambulatory Visit: Payer: Medicare HMO

## 2022-02-07 DIAGNOSIS — N201 Calculus of ureter: Secondary | ICD-10-CM

## 2022-02-08 LAB — LITHOLINK SERUM PANEL
CO2: 26 mmol/L (ref 20–29)
Calcium: 9.1 mg/dL (ref 8.6–10.2)
Chloride: 102 mmol/L (ref 96–106)
Creatinine, Ser: 1.04 mg/dL (ref 0.76–1.27)
Magnesium: 2 mg/dL (ref 1.6–2.3)
Phosphorus: 3 mg/dL (ref 2.8–4.1)
Potassium: 4 mmol/L (ref 3.5–5.2)
Sodium: 141 mmol/L (ref 134–144)
Uric Acid: 4.6 mg/dL (ref 3.8–8.4)
eGFR: 77 mL/min/{1.73_m2} (ref >59)

## 2022-02-12 LAB — LITHOLINK 24HR URINE PANEL
Ammonium, Urine: 41 mmol/24 hr (ref 15–60)
Calcium Oxalate Saturation: 8.3 (ref 6.00–10.00)
Calcium Phosphate Saturation: 0.28 — ABNORMAL LOW (ref 0.50–2.00)
Calcium, Urine: 145 mg/24 hr (ref <250)
Calcium/Creatinine Ratio: 131 mg/g creat (ref 34–196)
Calcium/Kg Body Weight: 1.7 mg/24 hr/kg (ref <4.0)
Chloride, Urine: 152 mmol/24 hr (ref 70–250)
Citrate, Urine: 209 mg/24 hr — ABNORMAL LOW (ref >450)
Creatinine, Urine: 1109 mg/24 hr
Creatinine/Kg Body Weight: 13.1 mg/24 hr/kg (ref 11.9–24.4)
Magnesium, Urine: 55 mg/24 hr (ref 30–120)
Oxalate, Urine: 21 mg/24 hr (ref 20–40)
Phosphorus, Urine: 506 mg/24 hr — ABNORMAL LOW (ref 600–1200)
Potassium, Urine: 30 mmol/24 hr (ref 20–100)
Protein Catabolic Rate: 0.6 g/kg/24 hr — ABNORMAL LOW (ref 0.8–1.4)
Sodium, Urine: 124 mmol/24 hr (ref 50–150)
Sulfate, Urine: 20 meq/24 hr (ref 20–80)
Urea Nitrogen, Urine: 5.19 g/24 hr — ABNORMAL LOW (ref 6.00–14.00)
Uric Acid Saturation: 3.77 — ABNORMAL HIGH (ref <1.00)
Uric Acid, Urine: 565 mg/24 hr (ref <800)
Urine Volume (Preserved): 930 mL/24 hr (ref 500–4000)
pH, 24 hr, Urine: 5.226 — ABNORMAL LOW (ref 5.800–6.200)

## 2022-02-15 ENCOUNTER — Encounter: Payer: Self-pay | Admitting: Urology

## 2022-02-17 ENCOUNTER — Telehealth: Payer: Self-pay | Admitting: *Deleted

## 2022-02-17 NOTE — Telephone Encounter (Signed)
-----   Message from Abbie Sons, MD sent at 02/17/2022 10:27 AM EST ----- Please let patient know I sent him a MyChart message regarding his 24-hour urine study.  Looks like he last checked MyChart 01/25/2022

## 2022-02-18 NOTE — Telephone Encounter (Signed)
Talked with patient today and he is going to read his my chart message. I oftered to read it to him  but he states he will read it and reply.

## 2022-03-02 NOTE — Progress Notes (Unsigned)
Cardiology Office Note   Date:  03/03/2022   ID:  Bobby Flowers, DOB 01/28/1951, MRN [id number]  PCP:  [name], MD  Cardiologist:   [name], MD   Chief Complaint  Patient presents with   Other    6 month f/u no complaints today. Meds reviewed verbally with pt.      History of Present Illness: Bobby Flowers is a 72 y.o. Bobby who presents for a follow-up visit regarding persistent atrial fibrillation.   He has known history of persistent atrial fibrillation and mitral valve prolapse with mild regurgitation. He has prolonged history of atrial fibrillation which was diagnosed in the 61s.  He was briefly on warfarin but had an allergic reaction with a rash. He has not been on any anticoagulant since then given his low CHADS2 VASc score.  He quit smoking in 2002 and does not consume alcohol. He was seen by Dr. Rayann Heman in 2015 for consultation regarding possible catheter ablation for atrial fibrillation. Given stability on low-dose amiodarone, which was best decided to continue with medical therapy. He had recurrent atrial fibrillation in 2016 but he converted back to sinus rhythm after increasing the dose of amiodarone.   He had left heart catheterization in 2019 which showed normal coronary arteries with very unusual horizontal orientation of the heart.  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  CT chest there showed changes suggestive of emphysema with no evidence of interstitial lung disease.  It was felt that he has mild COPD related to previous tobacco use and symptoms improved with inhalers. He was seen by Dr. Quentin Ore to discuss the possibility of A-fib ablation but the patient elected to continue with amiodarone.  He has been doing well with no recent chest pain or worsening dyspnea.  He takes his medications regularly.   Past Medical History:  Diagnosis Date   Chest tightness    a.  05/2017 Cath: nl cors.   Chronic pain of left lower extremity    right   COPD (chronic obstructive pulmonary disease) (HCC)    DDD (degenerative disc disease), lumbosacral    Dilated aortic root ([place])    a. 06/2017 Echo: Ao root 4.4cm.   Dysrhythmia    afib   Headache    migraines in past. none for 6-8 yrs.   History of kidney stones    Hypothyroidism    Mitral valve prolapse    a. 09/2015 Echo: EF 55-60%, no rwma, nl LA size, nl RV fxn, nl PASP; b. 06/2017 Echo: EF 55-60%, no rwma, Gr2 DD. Asc Ao 4.4cm. Mild MR (? mild prolapse). Mildly dil LA.   PAF (paroxysmal atrial fibrillation) ([place])    a. Dx in 1980's; b. CHA2DS2VASc = 1 - briefly on coumadin many years ago-->caused rash; c. Chronic Amio '200mg'$  QD.   Wears contact lenses     Past Surgical History:  Procedure Laterality Date   CARDIAC CATHETERIZATION     COLONOSCOPY  04/25/2005   COLONOSCOPY WITH PROPOFOL N/A 07/22/2015   Procedure: COLONOSCOPY WITH PROPOFOL;  Surgeon: Robert Bellow, MD;  Location: [facility] ENDOSCOPY;  Service: Endoscopy;  Laterality: N/A;   COLONOSCOPY WITH PROPOFOL N/A 09/04/2020   Procedure: COLONOSCOPY WITH PROPOFOL;  Surgeon: Robert Bellow, MD;  Location: [facility] ENDOSCOPY;  Service: Endoscopy;  Laterality: N/A;   CYSTOSCOPY WITH HOLMIUM LASER LITHOTRIPSY Bilateral 07/28/2017   Procedure: CYSTOSCOPY WITH HOLMIUM LASER LITHOTRIPSY;  Surgeon: Gloriann Loan,  Desiree Hane, MD;  Location: [facility] ORS;  Service: Urology;  Laterality: Bilateral;   CYSTOSCOPY WITH STENT PLACEMENT Bilateral 07/28/2017   Procedure: CYSTOSCOPY WITH STENT PLACEMENT;  Surgeon: Lucas Mallow, MD;  Location: [facility] ORS;  Service: Urology;  Laterality: Bilateral;   CYSTOSCOPY WITH URETEROSCOPY Bilateral 07/28/2017   Procedure: CYSTOSCOPY WITH URETEROSCOPY;  Surgeon: Lucas Mallow, MD;  Location: [facility] ORS;  Service: Urology;  Laterality: Bilateral;   CYSTOSCOPY/URETEROSCOPY/HOLMIUM LASER/STENT PLACEMENT Left 11/09/2021   Procedure:  CYSTOSCOPY/URETEROSCOPY/HOLMIUM LASER/STENT PLACEMENT;  Surgeon: Abbie Sons, MD;  Location: [facility] ORS;  Service: Urology;  Laterality: Left;   EYE SURGERY Left    cataract surgery   HEMORROIDECTOMY     HERNIA REPAIR Left 05/22/2015   Left inguinal hernia repair with medium Ultra Pro mesh   INGUINAL HERNIA REPAIR Left 07/22/2015   Procedure: HERNIA REPAIR INGUINAL ADULT;  Surgeon: Robert Bellow, MD;  Location: [facility] ORS;  Service: General;  Laterality: Left;   LEFT HEART CATH AND CORONARY ANGIOGRAPHY Left 05/29/2017   Procedure: LEFT HEART CATH AND CORONARY ANGIOGRAPHY;  Surgeon: [name], MD;  Location: [place] [facility] CV LAB;  Service: Cardiovascular;  Laterality: Left;   LUMBAR FUSION N/A    LUMBAR LAMINECTOMY/DECOMPRESSION MICRODISCECTOMY Right 07/01/2019   Procedure: OPEN L4 LAMINECTOMY, L3/4 DISCECTOMY ON RIGHT, L4/5 DISCECTOMY;  Surgeon: Deetta Perla, MD;  Location: [facility] ORS;  Service: Neurosurgery;  Laterality: Right;   myringotomy     with tubes x 5.  tubes fall out frequently   MYRINGOTOMY WITH TUBE PLACEMENT Left 12/13/2018   Procedure: MYRINGOTOMY WITH T-TUBE PLACEMENT;  Surgeon: [name], MD;  Location: [place];  Service: ENT;  Laterality: Left;  needs to stay first case vaught / juengel doing case together   NASAL SINUS SURGERY     NASOPHARYNGOSCOPY EUSTATION TUBE BALLOON DILATION Left 12/13/2018   Procedure: NASOPHARYNGOSCOPY EUSTATION TUBE BALLOON DILATION WITH OUTFRACTURE OF TURBINATES;  Surgeon: [name], MD;  Location: [place];  Service: ENT;  Laterality: Left;   SPINE SURGERY N/A    TOTAL HIP ARTHROPLASTY Right 03/26/2021   Procedure: TOTAL HIP ARTHROPLASTY ANTERIOR APPROACH;  Surgeon: Hessie Knows, MD;  Location: [facility] ORS;  Service: Orthopedics;  Laterality: Right;   VASECTOMY       Current Outpatient Medications  Medication Sig Dispense Refill   acetaminophen (TYLENOL) 500 MG tablet Take 500 mg by mouth every 6  (six) hours as needed for moderate pain or headache.     albuterol (VENTOLIN HFA) 108 (90 Base) MCG/ACT inhaler Inhale 2 puffs into the lungs every 6 (six) hours as needed for wheezing or shortness of breath. 8 g 2   amiodarone (PACERONE) 200 MG tablet TAKE ONE TABLET BY MOUTH DAILY 90 tablet 3   aspirin 81 MG EC tablet Take 81 mg by mouth daily.     atenolol (TENORMIN) 25 MG tablet TAKE ONE TABLET BY MOUTH DAILY 90 tablet 1   cholecalciferol (VITAMIN D3) 25 MCG (1000 UNIT) tablet Take 1,000 Units by mouth daily.     latanoprost (XALATAN) 0.005 % ophthalmic solution Place 1 drop into both eyes at bedtime.      levothyroxine (SYNTHROID, LEVOTHROID) 75 MCG tablet Take 75 mcg by mouth See admin instructions. Take 75 mcg daily Monday through Saturday, skip Sunday dose     Multiple Vitamins-Minerals (MULTIVITAMIN WITH MINERALS) tablet Take 1 tablet by mouth daily.     umeclidinium bromide (INCRUSE ELLIPTA) 62.5 MCG/ACT AEPB INHALE ONE PUFF BY MOUTH  DAILY 30 each 6   No current facility-administered medications for this visit.    Allergies:   Adhesive [tape] and Coumadin [warfarin]    Social History:  The patient  reports that he quit smoking about 21 years ago. His smoking use included cigarettes. He has a 46.50 pack-year smoking history. He has never used smokeless tobacco. He reports that he does not drink alcohol and does not use drugs.   Family History:  The patient's family history includes Alcoholism in his brother; Heart failure in his mother; Leukemia in his father.    ROS:  Please see the history of present illness.   Otherwise, review of systems are positive for none.   All other systems are reviewed and negative.    PHYSICAL EXAM: VS:  BP 108/60 (BP Location: Left Arm, Patient Position: Sitting, Cuff Size: Normal)   Ht '5\' 9"'$  (1.753 m)   Wt 182 lb 2 oz (82.6 kg)   SpO2 96%   BMI 26.90 kg/m  , BMI Body mass index is 26.9 kg/m. GEN: Well nourished, well developed, in no acute  distress  HEENT: normal  Neck: no JVD, carotid bruits, or masses Cardiac: RRR; rubs, or gallops,no edema .  1/ 6 systolic murmur at the base. Respiratory:  clear to auscultation bilaterally, normal work of breathing GI: soft, nontender, nondistended, + BS MS: no deformity or atrophy  Skin: warm and dry, no rash Neuro:  Strength and sensation are intact Psych: euthymic mood, full affect   EKG:  EKG is ordered today. The ekg ordered today demonstrates sinus bradycardia with first-degree AV block with anterior infarct pattern.  This is not a new finding.   Recent Labs: 11/02/2021: ALT 29; BUN 18; Hemoglobin 15.3; Platelets 213 02/07/2022: Creatinine, Ser 1.04; Magnesium 2.0; Potassium 4.0; Sodium 141    Lipid Panel No results found for: "CHOL", "TRIG", "HDL", "CHOLHDL", "VLDL", "LDLCALC", "LDLDIRECT"    Wt Readings from Last 3 Encounters:  03/03/22 182 lb 2 oz (82.6 kg)  11/22/21 178 lb (80.7 kg)  11/09/21 178 lb (80.7 kg)         ASSESSMENT AND PLAN:   1.  Persistent atrial fibrillation: Well-controlled with amiodarone 200 mg once daily.  His LFTs have been normal.  He does have mildly abnormal TSH but T3 and T4 were normal. CHA2DS2-VASc score is still 1 and the patient is currently not on anticoagulation.  2.  Mitral valve disease with mitral valve prolapse: Most recent echocardiogram in September 2022 showed only mild mitral regurgitation.  3.  Ascending aortic aneurysm: This has been stable in size at 4.5 cm. The patient is established with Dr. Cyndia Bent.    Disposition:   FU with me in 6 months  Signed,  [name], MD  03/03/2022 8:38 AM    [place]

## 2022-03-03 ENCOUNTER — Encounter: Payer: Self-pay | Admitting: Cardiovascular Disease

## 2022-03-03 ENCOUNTER — Ambulatory Visit: Payer: Medicare HMO | Attending: Cardiovascular Disease | Admitting: Cardiovascular Disease

## 2022-03-03 VITALS — BP 108/60 | Ht 69.0 in | Wt 182.1 lb

## 2022-03-03 DIAGNOSIS — I7121 Aneurysm of the ascending aorta, without rupture: Secondary | ICD-10-CM

## 2022-03-03 DIAGNOSIS — I341 Nonrheumatic mitral (valve) prolapse: Secondary | ICD-10-CM

## 2022-03-03 DIAGNOSIS — I4819 Other persistent atrial fibrillation: Secondary | ICD-10-CM

## 2022-03-03 NOTE — Patient Instructions (Signed)
Medication Instructions:  No changes *If you need a refill on your cardiac medications before your next appointment, please call your pharmacy*   Lab Work: None ordered If you have labs (blood work) drawn today and your tests are completely normal, you will receive your results only by: MyChart Message (if you have MyChart) OR A paper copy in the mail If you have any lab test that is abnormal or we need to change your treatment, we will call you to review the results.   Testing/Procedures: None ordered   Follow-Up: At [facility], you and your health needs are our priority.  As part of our continuing mission to provide you with exceptional heart care, we have created designated Provider Care Teams.  These Care Teams include your primary Cardiologist (physician) and Advanced Practice Providers (APPs -  Physician Assistants and Nurse Practitioners) who all work together to provide you with the care you need, when you need it.  We recommend signing up for the patient portal called "MyChart".  Sign up information is provided on this After Visit Summary.  MyChart is used to connect with patients for Virtual Visits (Telemedicine).  Patients are able to view lab/test results, encounter notes, upcoming appointments, etc.  Non-urgent messages can be sent to your provider as well.   To learn more about what you can do with MyChart, go to [email or web address].    Your next appointment:   6 month(s)  Provider:   You may see Muhammad Arida, MD or one of the following Advanced Practice Providers on your designated Care Team:   Christopher Berge, NP Ryan Dunn, PA-C Cadence Furth, PA-C Sheri Hammock, NP    

## 2022-03-31 ENCOUNTER — Other Ambulatory Visit: Payer: Self-pay

## 2022-03-31 ENCOUNTER — Encounter (HOSPITAL_COMMUNITY): Payer: Self-pay | Admitting: *Deleted

## 2022-03-31 MED ORDER — AMIODARONE HCL 200 MG PO TABS: 200.0000 mg | ORAL_TABLET | Freq: Every day | ORAL | 3 refills | Status: DC

## 2022-04-11 ENCOUNTER — Other Ambulatory Visit: Payer: Self-pay

## 2022-04-11 MED ORDER — ATENOLOL 25 MG PO TABS: 25.0000 mg | ORAL_TABLET | Freq: Every day | ORAL | 1 refills | Status: DC

## 2022-05-25 ENCOUNTER — Encounter: Payer: Self-pay | Admitting: Urology

## 2022-05-25 ENCOUNTER — Ambulatory Visit
Admission: RE | Admit: 2022-05-25 | Discharge: 2022-05-25 | Disposition: A | Discharge status: Home / Self Care | Payer: Medicare HMO | Attending: Urology | Admitting: Urology

## 2022-05-25 ENCOUNTER — Ambulatory Visit: Payer: Medicare HMO | Admitting: Urology

## 2022-05-25 ENCOUNTER — Ambulatory Visit
Admission: RE | Admit: 2022-05-25 | Discharge: 2022-05-25 | Disposition: A | Discharge status: Home / Self Care | Payer: Medicare HMO | Source: Ambulatory Visit | Attending: Urology | Admitting: Urology

## 2022-05-25 VITALS — BP 149/82 | HR 50 | Ht 69.0 in | Wt 182.0 lb

## 2022-05-25 DIAGNOSIS — N200 Calculus of kidney: Secondary | ICD-10-CM

## 2022-05-25 DIAGNOSIS — Z466 Encounter for fitting and adjustment of urinary device: Secondary | ICD-10-CM | POA: Insufficient documentation

## 2022-05-25 DIAGNOSIS — Z87442 Personal history of urinary calculi: Secondary | ICD-10-CM | POA: Diagnosis not present

## 2022-05-25 NOTE — Progress Notes (Signed)
I, Bobby Flowers,acting as a scribe for Abbie Sons, MD.,have documented all relevant documentation on the behalf of Abbie Sons, MD,as directed by  Abbie Sons, MD while in the presence of Abbie Sons, MD.   05/25/22 10:34 AM   Mauro Kaufmann 1950/09/14 [id number]  Referring provider: [name], MD [address] [facility] [place],  [place] [address]  Chief Complaint  Patient presents with   Nephrolithiasis    Urologic history: 1.  Nephrolithiasis Bilateral ureteroscopic stone removal by Dr. Gloriann Loan 07/2017 Small, nonobstructing left lower pole calculi CT abdomen/pelvis with contrast was performed which showed moderate left hydronephrosis/hydroureter secondary to an 8 mm lower proximal ureteral calculus.  There were bilateral 2 mm lower pole nonobstructing calculi  HPI: 72 y.o. male presents for his annual follow-up.  He underwent ureteroscopic removal of a left mid ureteral calculus 11/09/21 and stent was removed 11/22/21. Stone analysis was 100% uric acid Underwent a metabolic evaluation which showed low urine volume at 930 mL and a low urine pH at 5.2. We discussed increasing water intake and potassium citrate to alkalinize the urine. He declined potassium citrate. He acknowledged the need to improve his water intake, though he has reduced his consumption of soft drinks. Doing well since last visit No bothersome LUTS Denies dysuria, gross hematuria Denies flank, abdominal or pelvic pain    PMH: Past Medical History:  Diagnosis Date   Chest tightness    a. 05/2017 Cath: nl cors.   Chronic pain of left lower extremity    right   COPD (chronic obstructive pulmonary disease)    DDD (degenerative disc disease), lumbosacral    Dilated aortic root    a. 06/2017 Echo: Ao root 4.4cm.   Dysrhythmia    afib   Headache    migraines in past. none for 6-8 yrs.   History of kidney stones    Hypothyroidism    Mitral valve prolapse    a. 09/2015 Echo: EF  55-60%, no rwma, nl LA size, nl RV fxn, nl PASP; b. 06/2017 Echo: EF 55-60%, no rwma, Gr2 DD. Asc Ao 4.4cm. Mild MR (? mild prolapse). Mildly dil LA.   PAF (paroxysmal atrial fibrillation)    a. Dx in 1980's; b. CHA2DS2VASc = 1 - briefly on coumadin many years ago-->caused rash; c. Chronic Amio 200mg  QD.   Wears contact lenses     Surgical History: Past Surgical History:  Procedure Laterality Date   CARDIAC CATHETERIZATION     COLONOSCOPY  04/25/2005   COLONOSCOPY WITH PROPOFOL N/A 07/22/2015   Procedure: COLONOSCOPY WITH PROPOFOL;  Surgeon: Robert Bellow, MD;  Location: [facility] ENDOSCOPY;  Service: Endoscopy;  Laterality: N/A;   COLONOSCOPY WITH PROPOFOL N/A 09/04/2020   Procedure: COLONOSCOPY WITH PROPOFOL;  Surgeon: Robert Bellow, MD;  Location: [facility] ENDOSCOPY;  Service: Endoscopy;  Laterality: N/A;   CYSTOSCOPY WITH HOLMIUM LASER LITHOTRIPSY Bilateral 07/28/2017   Procedure: CYSTOSCOPY WITH HOLMIUM LASER LITHOTRIPSY;  Surgeon: Lucas Mallow, MD;  Location: [facility] ORS;  Service: Urology;  Laterality: Bilateral;   CYSTOSCOPY WITH STENT PLACEMENT Bilateral 07/28/2017   Procedure: CYSTOSCOPY WITH STENT PLACEMENT;  Surgeon: Lucas Mallow, MD;  Location: [facility] ORS;  Service: Urology;  Laterality: Bilateral;   CYSTOSCOPY WITH URETEROSCOPY Bilateral 07/28/2017   Procedure: CYSTOSCOPY WITH URETEROSCOPY;  Surgeon: Lucas Mallow, MD;  Location: [facility] ORS;  Service: Urology;  Laterality: Bilateral;   CYSTOSCOPY/URETEROSCOPY/HOLMIUM LASER/STENT PLACEMENT Left 11/09/2021   Procedure: CYSTOSCOPY/URETEROSCOPY/HOLMIUM LASER/STENT  PLACEMENT;  Surgeon: Abbie Sons, MD;  Location: [facility] ORS;  Service: Urology;  Laterality: Left;   EYE SURGERY Left    cataract surgery   HEMORROIDECTOMY     HERNIA REPAIR Left 05/22/2015   Left inguinal hernia repair with medium Ultra Pro mesh   INGUINAL HERNIA REPAIR Left 07/22/2015   Procedure: HERNIA REPAIR INGUINAL ADULT;  Surgeon: Robert Bellow,  MD;  Location: [facility] ORS;  Service: General;  Laterality: Left;   LEFT HEART CATH AND CORONARY ANGIOGRAPHY Left 05/29/2017   Procedure: LEFT HEART CATH AND CORONARY ANGIOGRAPHY;  Surgeon: [name], MD;  Location: [place] CV LAB;  Service: Cardiovascular;  Laterality: Left;   LUMBAR FUSION N/A    LUMBAR LAMINECTOMY/DECOMPRESSION MICRODISCECTOMY Right 07/01/2019   Procedure: OPEN L4 LAMINECTOMY, L3/4 DISCECTOMY ON RIGHT, L4/5 DISCECTOMY;  Surgeon: Deetta Perla, MD;  Location: [facility] ORS;  Service: Neurosurgery;  Laterality: Right;   myringotomy     with tubes x 5.  tubes fall out frequently   MYRINGOTOMY WITH TUBE PLACEMENT Left 12/13/2018   Procedure: MYRINGOTOMY WITH T-TUBE PLACEMENT;  Surgeon: [name], MD;  Location: [place];  Service: ENT;  Laterality: Left;  needs to stay first case vaught / juengel doing case together   NASAL SINUS SURGERY     NASOPHARYNGOSCOPY EUSTATION TUBE BALLOON DILATION Left 12/13/2018   Procedure: NASOPHARYNGOSCOPY EUSTATION TUBE BALLOON DILATION WITH OUTFRACTURE OF TURBINATES;  Surgeon: [name], MD;  Location: [place];  Service: ENT;  Laterality: Left;   SPINE SURGERY N/A    TOTAL HIP ARTHROPLASTY Right 03/26/2021   Procedure: TOTAL HIP ARTHROPLASTY ANTERIOR APPROACH;  Surgeon: Hessie Knows, MD;  Location: [facility] ORS;  Service: Orthopedics;  Laterality: Right;   VASECTOMY      Home Medications:  Allergies as of 05/25/2022       Reactions   Adhesive [tape] Rash   Paper tape okay   Coumadin [warfarin] Rash        Medication List        Accurate as of May 25, 2022 10:34 AM. If you have any questions, ask your nurse or doctor.          acetaminophen 500 MG tablet Commonly known as: TYLENOL Take 500 mg by mouth every 6 (six) hours as needed for moderate pain or headache.   albuterol 108 (90 Base) MCG/ACT inhaler Commonly known as: VENTOLIN HFA Inhale 2 puffs into the lungs every 6 (six) hours as  needed for wheezing or shortness of breath.   amiodarone 200 MG tablet Commonly known as: PACERONE Take 1 tablet (200 mg total) by mouth daily.   aspirin EC 81 MG tablet Take 81 mg by mouth daily.   atenolol 25 MG tablet Commonly known as: TENORMIN Take 1 tablet (25 mg total) by mouth daily.   cholecalciferol 25 MCG (1000 UNIT) tablet Commonly known as: VITAMIN D3 Take 1,000 Units by mouth daily.   Incruse Ellipta 62.5 MCG/ACT Aepb Generic drug: umeclidinium bromide INHALE ONE PUFF BY MOUTH DAILY   latanoprost 0.005 % ophthalmic solution Commonly known as: XALATAN Place 1 drop into both eyes at bedtime.   levothyroxine 75 MCG tablet Commonly known as: SYNTHROID Take 75 mcg by mouth See admin instructions. Take 75 mcg daily Monday through Saturday, skip Sunday dose   multivitamin with minerals tablet Take 1 tablet by mouth daily.        Allergies:  Allergies  Allergen Reactions   Adhesive [Tape] Rash    Paper  tape okay   Coumadin [Warfarin] Rash    Family History: Family History  Problem Relation Age of Onset   Heart failure Mother    Leukemia Father    Alcoholism Brother     Social History:  reports that he quit smoking about 21 years ago. His smoking use included cigarettes. He has a 46.50 pack-year smoking history. He has never used smokeless tobacco. He reports that he does not drink alcohol and does not use drugs.   Physical Exam: BP (!) 149/82   Pulse (!) 50   Ht 5\' 9"  (1.753 m)   Wt 182 lb (82.6 kg)   BMI 26.88 kg/m   Constitutional:  Alert and oriented, No acute distress. HEENT: [place] AT Respiratory: Normal respiratory effort, no increased work of breathing. Psychiatric: Normal mood and affect.   Assessment & Plan:    1.  Recurrent uric acid stone disease Prior metabolic evaluation, remarkable for low urine volume and low pH. Again discussed increasing water intake to keep urine output between 2-2.5 liters per day. He declined potassium  citrate and recommended LithoLyte to alkalize the urine. One year follow-up with [facility] and instructed to call earlier for recurrent renal colic.  I have reviewed the above documentation for accuracy and completeness, and I agree with the above.   Abbie Sons, [place] [address], [place], [place] [address] [phone]

## 2022-08-31 ENCOUNTER — Other Ambulatory Visit: Payer: Self-pay | Admitting: Surgery

## 2022-08-31 DIAGNOSIS — I7121 Aneurysm of the ascending aorta, without rupture: Secondary | ICD-10-CM

## 2022-10-11 ENCOUNTER — Other Ambulatory Visit: Payer: Self-pay | Admitting: Cardiovascular Disease

## 2022-10-18 NOTE — Progress Notes (Unsigned)
[address]       [place] [address]             [phone]      Bobby Flowers [id number] Nov 07, 1950  History of Present Illness: [name] is a 72 year old male with a past medical history of COPD, paroxysmal atrial fibrillation, chornic lower extremity pain, hypothyroidism, mitral valve prolapse, and an ascending thoracic aortic aneurysm. On echocardiogram in 2019 he was incidentally found to have a thoracic aortic aneurysm that measured 4.4cm. He has been seen by our clinic since 2020, when his aneurysm measure 4.5cm by CTA and has remained stable over time. He denies family history of TAA and personal history of connective tissue disorder.   Today he reports some shortness of breath with exertion but attributes that to his COPD. He denies chest tightness, chest pain, dizziness, LOC. He also does admit to noticing his atrial fibrillation every once in a while but states it does not bother him. He continues to regularly follow up with cardiology.  Current Outpatient Medications on File Prior to Visit  Medication Sig Dispense Refill   acetaminophen (TYLENOL) 500 MG tablet Take 500 mg by mouth every 6 (six) hours as needed for moderate pain or headache.     albuterol (VENTOLIN HFA) 108 (90 Base) MCG/ACT inhaler Inhale 2 puffs into the lungs every 6 (six) hours as needed for wheezing or shortness of breath. 8 g 2   amiodarone (PACERONE) 200 MG tablet Take 1 tablet (200 mg total) by mouth daily. 90 tablet 3   aspirin 81 MG EC tablet Take 81 mg by mouth daily.     atenolol (TENORMIN) 25 MG tablet TAKE 1 TABLET BY MOUTH DAILY 90 tablet 1   cholecalciferol (VITAMIN D3) 25 MCG (1000 UNIT) tablet Take 1,000 Units by mouth daily.     latanoprost (XALATAN) 0.005 % ophthalmic solution Place 1 drop into both eyes at bedtime.      levothyroxine (SYNTHROID, LEVOTHROID) 75 MCG tablet Take 75 mcg by mouth See admin instructions. Take 75 mcg daily Monday through Saturday, skip Sunday  dose     Multiple Vitamins-Minerals (MULTIVITAMIN WITH MINERALS) tablet Take 1 tablet by mouth daily.     umeclidinium bromide (INCRUSE ELLIPTA) 62.5 MCG/ACT AEPB INHALE ONE PUFF BY MOUTH DAILY 30 each 6   No current facility-administered medications on file prior to visit.   Vitals: Today's Vitals   10/20/22 1319  BP: 114/70  Pulse: (!) 55  Resp: 20  SpO2: 97%  Weight: 178 lb (80.7 kg)  Height: 5\' 9"  (1.753 m)   Body mass index is 26.29 kg/m.  Physical Exam General: No acute distress Neuro: Grossly intact CV: Regular rate and rhythm, no murmur Pulm: Clear to auscultation GI: Nontender Extremities: No edema, Radial pulses 2+ bilaterally, DP/PT pulses 2+ bilaterally  CTA Results: CLINICAL DATA:  Thoracic aortic aneurysm.   EXAM: CT ANGIOGRAPHY CHEST WITH CONTRAST   TECHNIQUE: Multidetector CT imaging of the chest was performed using the standard protocol during bolus administration of intravenous contrast. Multiplanar CT image reconstructions and MIPs were obtained to evaluate the vascular anatomy.   RADIATION DOSE REDUCTION: This exam was performed according to the departmental dose-optimization program which includes automated exposure control, adjustment of the mA and/or kV according to patient size and/or use of iterative reconstruction technique.   CONTRAST:  75mL ISOVUE-300 IOPAMIDOL (ISOVUE-300) INJECTION 61%   COMPARISON:  October 18, 2021.   FINDINGS: Cardiovascular: Grossly stable  4.6 cm ascending thoracic aortic aneurysm. No dissection is noted. Great vessels are widely patent. Mild cardiomegaly. No pericardial effusion.   Mediastinum/Nodes: No enlarged mediastinal, hilar, or axillary lymph nodes. Thyroid gland, trachea, and esophagus demonstrate no significant findings.   Lungs/Pleura: No pneumothorax or pleural effusion is noted. Emphysematous disease is again noted and stable. Grossly stable 5 mm nodule is noted in left upper lobe best seen on  image number 84 of series 11. Stable probable scarring is also noted laterally in left upper lobe.   Upper Abdomen: Stable probable hemangioma seen laterally in right hepatic lobe. Stable hepatic cysts are noted.   Musculoskeletal: No chest wall abnormality. No acute or significant osseous findings.   Review of the MIP images confirms the above findings.   IMPRESSION: Grossly stable 4.6 cm Ascending thoracic aortic aneurysm. Recommend semi-annual imaging followup by CTA or MRA and referral to cardiothoracic surgery if not already obtained. This recommendation follows 2010 ACCF/AHA/AATS/ACR/ASA/SCA/SCAI/SIR/STS/SVM Guidelines for the Diagnosis and Management of Patients With Thoracic Aortic Disease. Circulation. 2010; 121: Q657-Q469. Aortic aneurysm NOS (ICD10-I71.9).   Grossly stable 5 mm nodule seen in left upper lobe. Attention to this on follow-up imaging is recommended.   Emphysema (ICD10-J43.9).     Electronically Signed   By: Bobby Flowers M.D.   On: 10/20/2022 14:10  A/P: Thoracic aortic aneurysm: [name] presents to the clinic with a 4.6cm ascending aortic aneurysm. Echocardiogram shows a trileaflet aortic valve with mild aortic valve regurgitation. We discussed the natural history and and risk factors for growth of ascending aortic aneurysms. We covered the importance of tight blood pressure control, refraining from lifting heavy objects, and avoiding fluoroquinolones. The patient is aware of signs and symptoms of aortic dissection and when to present to the emergency department.  His aneurysm currently does not meet surgical criteria of 5.5cm. We will continue surveillance with a CTA in 6 months.   Pulmonary nodule: Grossly stable LUL nodule measuring 5mm, will continue to monitor on CTA scans.   Risk Modification:  Statin:  No HLD, will defer to PCP  Smoking cessation instruction/counseling given: Former smoker  Patient was counseled on importance of Blood  Pressure Control.  Despite Medical intervention if the patient notices persistently elevated blood pressure readings.  They are instructed to contact their Primary Care Physician  Please avoid use of Fluoroquinolones as this can potentially increase your risk of Aortic Rupture and/or Dissection  Patient educated on signs and symptoms of Aortic Dissection, handout also provided in AVS  Jenny Reichmann, PA-C 10/18/22

## 2022-10-20 ENCOUNTER — Ambulatory Visit
Admission: RE | Admit: 2022-10-20 | Discharge: 2022-10-20 | Disposition: A | Discharge status: Home / Self Care | Payer: Medicare HMO | Source: Ambulatory Visit | Attending: Surgery | Admitting: Surgery

## 2022-10-20 ENCOUNTER — Ambulatory Visit: Payer: Medicare HMO | Admitting: Physician Assistant

## 2022-10-20 ENCOUNTER — Encounter: Payer: Self-pay | Admitting: Physician Assistant

## 2022-10-20 VITALS — BP 114/70 | HR 55 | Resp 20 | Ht 69.0 in | Wt 178.0 lb

## 2022-10-20 DIAGNOSIS — I7121 Aneurysm of the ascending aorta, without rupture: Secondary | ICD-10-CM

## 2022-10-20 MED ORDER — IOPAMIDOL (ISOVUE-300) INJECTION 61%
200.0000 mL | Freq: Once | INTRAVENOUS | Status: AC | PRN
  Administered 2022-10-20: 75 mL via INTRAVENOUS

## 2022-10-20 NOTE — Patient Instructions (Signed)

## 2022-11-21 ENCOUNTER — Other Ambulatory Visit: Payer: Self-pay

## 2022-11-21 ENCOUNTER — Telehealth: Payer: Self-pay | Admitting: Cardiovascular Disease

## 2022-11-21 ENCOUNTER — Emergency Department: Payer: Medicare HMO

## 2022-11-21 ENCOUNTER — Emergency Department
Admission: EM | Admit: 2022-11-21 | Discharge: 2022-11-21 | Disposition: A | Discharge status: Home / Self Care | Payer: Medicare HMO | Attending: Emergency Medicine | Admitting: Emergency Medicine

## 2022-11-21 DIAGNOSIS — R079 Chest pain, unspecified: Secondary | ICD-10-CM | POA: Diagnosis present

## 2022-11-21 DIAGNOSIS — I4891 Unspecified atrial fibrillation: Secondary | ICD-10-CM | POA: Insufficient documentation

## 2022-11-21 LAB — BASIC METABOLIC PANEL
Anion gap: 8 (ref 5–15)
BUN: 19 mg/dL (ref 8–23)
CO2: 28 mmol/L (ref 22–32)
Calcium: 8.9 mg/dL (ref 8.9–10.3)
Chloride: 105 mmol/L (ref 98–111)
Creatinine, Ser: 0.98 mg/dL (ref 0.61–1.24)
GFR, Estimated: >60 mL/min (ref >60)
Glucose, Bld: 133 mg/dL — ABNORMAL HIGH (ref 70–99)
Potassium: 4.3 mmol/L (ref 3.5–5.1)
Sodium: 141 mmol/L (ref 135–145)

## 2022-11-21 LAB — CBC
HCT: 46.4 % (ref 39.0–52.0)
Hemoglobin: 15.3 g/dL (ref 13.0–17.0)
MCH: 30.2 pg (ref 26.0–34.0)
MCHC: 33 g/dL (ref 30.0–36.0)
MCV: 91.7 fL (ref 80.0–100.0)
Platelets: 180 10*3/uL (ref 150–400)
RBC: 5.06 MIL/uL (ref 4.22–5.81)
RDW: 13.2 % (ref 11.5–15.5)
WBC: 7.7 10*3/uL (ref 4.0–10.5)
nRBC: 0 % (ref 0.0–0.2)

## 2022-11-21 LAB — TROPONIN I (HIGH SENSITIVITY)
Troponin I (High Sensitivity): 5 ng/L (ref <18)
Troponin I (High Sensitivity): 3 ng/L (ref <18)

## 2022-11-21 NOTE — Discharge Instructions (Signed)
Please call your cardiologist for follow-up in the next couple of days and plan outpatient stress test.  Please return for any worsening symptoms.

## 2022-11-21 NOTE — Telephone Encounter (Signed)
   Pt c/o of Chest Pain: STAT if active CP, including tightness, pressure, jaw pain, radiating pain to shoulder/upper arm/back, CP unrelieved by Nitro. Symptoms reported of SOB, nausea, vomiting, sweating.  1. Are you having CP right now? Yes a little this morning, but not right now, not a sharp pain   2. Are you experiencing any other symptoms (ex. SOB, nausea, vomiting, sweating)? no   3. Is your CP continuous or coming and going? Comes and goes   4. Have you taken Nitroglycerin? no   5. How long have you been experiencing CP? A week on and off, feels most when taking a deep breath, thinks it may also be muscular.    6. If NO CP at time of call then end call with telling Pt to call back or call 911 if Chest pain returns prior to return call from triage team.

## 2022-11-21 NOTE — ED Provider Notes (Signed)
[facility] Provider Note    Event Date/Time   First MD Initiated Contact with Patient 11/21/22 2011     (approximate)   History   Chest Pain   HPI Bobby Flowers is a 72 y.o. male with A-fib presenting today for chest pain.  Patient noted over the past week he has had intermittent left-sided chest pain without radiation.  He notices a constant dullness that occasionally exacerbates.  Denies any obvious aggravating or alleviating factors.  Denies any associated symptoms like shortness of breath, nausea, sweating, abdominal pain.  No other leg swelling or leg pain.  Reports that he does have chronic symptoms like this in the past and was originally just going to see his cardiologist but they advised him to come to the ED for further evaluation.     Physical Exam   Triage Vital Signs: ED Triage Vitals  Encounter Vitals Group     BP 11/21/22 1537 132/74     Systolic BP Percentile --      Diastolic BP Percentile --      Pulse Rate 11/21/22 1537 (!) 58     Resp 11/21/22 1537 20     Temp 11/21/22 1537 98 F (36.7 C)     Temp Source 11/21/22 1537 Oral     SpO2 11/21/22 1537 97 %     Weight --      Height --      Head Circumference --      Peak Flow --      Pain Score 11/21/22 1543 5     Pain Loc --      Pain Education --      Exclude from Growth Chart --     Most recent vital signs: Vitals:   11/21/22 2016 11/21/22 2018  BP: (!) 163/79   Pulse: (!) 43   Resp: 16   Temp:  98.2 F (36.8 C)  SpO2: 99%    Physical Exam: I have reviewed the vital signs and nursing notes. General: Awake, alert, no acute distress.  Nontoxic appearing. Head:  Atraumatic, normocephalic.   ENT:  EOM intact, PERRL. Oral mucosa is pink and moist with no lesions. Neck: Neck is supple with full range of motion, No meningeal signs. Cardiovascular:  RRR, No murmurs. Peripheral pulses palpable and equal bilaterally. Respiratory:  Symmetrical chest wall expansion.  No  rhonchi, rales, or wheezes.  Good air movement throughout.  No use of accessory muscles.   Musculoskeletal:  No cyanosis or edema. Moving extremities with full ROM Abdomen:  Soft, nontender, nondistended. Neuro:  GCS 15, moving all four extremities, interacting appropriately. Speech clear. Psych:  Calm, appropriate.   Skin:  Warm, dry, no rash.    ED Results / Procedures / Treatments   Labs (all labs ordered are listed, but only abnormal results are displayed) Labs Reviewed  BASIC METABOLIC PANEL - Abnormal; Notable for the following components:      Result Value   Glucose, Bld 133 (*)    All other components within normal limits  CBC  TROPONIN I (HIGH SENSITIVITY)  TROPONIN I (HIGH SENSITIVITY)     EKG My EKG interpretation: Rate of 55, sinus bradycardia with first-degree AV block.  No acute ST elevations or depressions.  No new T wave inversions.  Overall comparable to most recent EKG.   RADIOLOGY Independently interpreted chest x-ray with no acute pathology.   PROCEDURES:  Critical Care performed: No  Procedures   MEDICATIONS ORDERED IN ED:  Medications - No data to display   IMPRESSION / MDM / ASSESSMENT AND PLAN / ED COURSE  I reviewed the triage vital signs and the nursing notes.                              Differential diagnosis includes, but is not limited to, ACS, pneumonia, pneumothorax, musculoskeletal pain.  Patient's presentation is most consistent with acute complicated illness / injury requiring diagnostic workup.  Patient is a 72 year old male presenting today for intermittent chest pain over the past week.  Does have reported history of the same in the past and has had issues with chronic pain syndrome from a musculoskeletal standpoint.  Vital signs are stable and exam rather unremarkable at this time.  No chest pain at the moment.  EKG consistent with prior baseline.  Troponins negative x 2.  Patient has a heart score of 3-4 depending on  interpretation of his symptoms.  Did discuss given his age, could admit to the hospital for stress test and offered this to him.  At this time, patient and wife do not want to stay in the hospital for stress test.  They would prefer outpatient follow-up with her cardiologist.  Patient is otherwise hemodynamically stable and asymptomatic at this time and I do feel this is safe.  He was given strict return precautions for any worsening symptoms and agreeable with the plan.  The patient is on the cardiac monitor to evaluate for evidence of arrhythmia and/or significant heart rate changes.     FINAL CLINICAL IMPRESSION(S) / ED DIAGNOSES   Final diagnoses:  Chest pain, unspecified type     Rx / DC Orders   ED Discharge Orders          Ordered    Ambulatory referral to Cardiology       Comments: If you have not heard from the Cardiology office within the next 72 hours please call [phone].   11/21/22 2057             Note:  This document was prepared using Dragon voice recognition software and may include unintentional dictation errors.   Janith Lima, MD 11/21/22 [phone]

## 2022-11-21 NOTE — Telephone Encounter (Signed)
Called and spoke with patient. Patient with complaint of intermittent chest pain that started last week. Patient states that the pain is a dull pain on the left side of his chest. Patient reports that he is currently having chest pain. Patient advised to be evaluated by the Emergency Department. Patient verbalizes understanding.

## 2022-11-21 NOTE — ED Notes (Signed)
Pt reports left sided chest pain for 1 week.  Intermittent sob.  Hx copd.  Former smoker.  No fevers or cough.  Pt denies n/v   sinus brady on monitor.   pt alert  speech clear.  Family with pt.

## 2022-11-21 NOTE — ED Triage Notes (Addendum)
Pt to ED via POV from home. Pt reports intermittent left sided CP x1 wk. Pt is followed up with cardiology. Pt has a known aneurysm. Advised by cardiology office to be evaluated.

## 2022-11-22 NOTE — Progress Notes (Unsigned)
Cardiology Office Note Date:  11/23/2022  Patient ID:  Bobby Flowers, Bobby Flowers January 19, 1951, MRN [id number] PCP:  Jerl Mina, MD  Cardiologist:  Lorine Bears, MD Electrophysiologist: Lanier Prude, MD    Chief Complaint: ER follow-up  History of Present Illness: Bobby Flowers is a 72 y.o. male with PMH notable for persis AFib not on OAC d/t low chadsvasc, mitral valve prolapse w mild MR, TAAA, hypothyroid, ; seen today for Lanier Prude, MD for post ER follow up.   He presented to [facility] ER 9/30 with intermittent L sided chest pain. Trops normal, EKG and tele normal, VSS so recommended to follow-up with cardiology office.  His AFib has been managed on low-dose amiodarone for many years, offered ablation but patietn did not wish to pursue.   On follow-up today, his recent chest episodes are happening with activity and at rest, sometimes associated with SOB. Never has dizziness, lightheaded, syncope, N/V, or palpitations with  the chest pain episodes. Separately, he has rare AF episodes that last for a few minutes. AF episode symptoms are SOB and a strange feeling in neck. His current chest pain symptoms do not feel like his AF symptoms at all.  He has some decreased exercise tolerance, but states that is d/t COPD. 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 He goes to gym 2-3 times per week, rides bike (2mi) and does stretching.   He diligently takes amiodarone daily. He says he notices his AF episodes will increase in frequency if he accidentally skips an atenolol.   AAD History: Amiodarone   Past Medical History:  Diagnosis Date   Chest tightness    a. 05/2017 Cath: nl cors.   Chronic pain of left lower extremity    right   COPD (chronic obstructive pulmonary disease) (HCC)    DDD (degenerative disc disease), lumbosacral    Dilated aortic root (HCC)    a. 06/2017 Echo: Ao root 4.4cm.   Dysrhythmia    afib   Headache    migraines in past. none for 6-8 yrs.    History of kidney stones    Hypothyroidism    Mitral valve prolapse    a. 09/2015 Echo: EF 55-60%, no rwma, nl LA size, nl RV fxn, nl PASP; b. 06/2017 Echo: EF 55-60%, no rwma, Gr2 DD. Asc Ao 4.4cm. Mild MR (? mild prolapse). Mildly dil LA.   PAF (paroxysmal atrial fibrillation) (HCC)    a. Dx in 1980's; b. CHA2DS2VASc = 1 - briefly on coumadin many years ago-->caused rash; c. Chronic Amio 200mg  QD.   Wears contact lenses     Past Surgical History:  Procedure Laterality Date   CARDIAC CATHETERIZATION     COLONOSCOPY  04/25/2005   COLONOSCOPY WITH PROPOFOL N/A 07/22/2015   Procedure: COLONOSCOPY WITH PROPOFOL;  Surgeon: Earline Mayotte, MD;  Location: [facility] ENDOSCOPY;  Service: Endoscopy;  Laterality: N/A;   COLONOSCOPY WITH PROPOFOL N/A 09/04/2020   Procedure: COLONOSCOPY WITH PROPOFOL;  Surgeon: Earline Mayotte, MD;  Location: [facility] ENDOSCOPY;  Service: Endoscopy;  Laterality: N/A;   CYSTOSCOPY WITH HOLMIUM LASER LITHOTRIPSY Bilateral 07/28/2017   Procedure: CYSTOSCOPY WITH HOLMIUM LASER LITHOTRIPSY;  Surgeon: Crista Elliot, MD;  Location: [facility] ORS;  Service: Urology;  Laterality: Bilateral;   CYSTOSCOPY WITH STENT PLACEMENT Bilateral 07/28/2017   Procedure: CYSTOSCOPY WITH STENT PLACEMENT;  Surgeon: Crista Elliot, MD;  Location: [facility] ORS;  Service: Urology;  Laterality: Bilateral;   CYSTOSCOPY WITH URETEROSCOPY  Bilateral 07/28/2017   Procedure: CYSTOSCOPY WITH URETEROSCOPY;  Surgeon: Crista Elliot, MD;  Location: [facility] ORS;  Service: Urology;  Laterality: Bilateral;   CYSTOSCOPY/URETEROSCOPY/HOLMIUM LASER/STENT PLACEMENT Left 11/09/2021   Procedure: CYSTOSCOPY/URETEROSCOPY/HOLMIUM LASER/STENT PLACEMENT;  Surgeon: Riki Altes, MD;  Location: [facility] ORS;  Service: Urology;  Laterality: Left;   EYE SURGERY Left    cataract surgery   HEMORROIDECTOMY     HERNIA REPAIR Left 05/22/2015   Left inguinal hernia repair with medium Ultra Pro mesh   INGUINAL HERNIA REPAIR Left  07/22/2015   Procedure: HERNIA REPAIR INGUINAL ADULT;  Surgeon: Earline Mayotte, MD;  Location: [facility] ORS;  Service: General;  Laterality: Left;   LEFT HEART CATH AND CORONARY ANGIOGRAPHY Left 05/29/2017   Procedure: LEFT HEART CATH AND CORONARY ANGIOGRAPHY;  Surgeon: Iran Ouch, MD;  Location: [facility] INVASIVE CV LAB;  Service: Cardiovascular;  Laterality: Left;   LUMBAR FUSION N/A    LUMBAR LAMINECTOMY/DECOMPRESSION MICRODISCECTOMY Right 07/01/2019   Procedure: OPEN L4 LAMINECTOMY, L3/4 DISCECTOMY ON RIGHT, L4/5 DISCECTOMY;  Surgeon: Lucy Chris, MD;  Location: [facility] ORS;  Service: Neurosurgery;  Laterality: Right;   myringotomy     with tubes x 5.  tubes fall out frequently   MYRINGOTOMY WITH TUBE PLACEMENT Left 12/13/2018   Procedure: MYRINGOTOMY WITH T-TUBE PLACEMENT;  Surgeon: Bud Face, MD;  Location: [facility];  Service: ENT;  Laterality: Left;  needs to stay first case vaught / juengel doing case together   NASAL SINUS SURGERY     NASOPHARYNGOSCOPY EUSTATION TUBE BALLOON DILATION Left 12/13/2018   Procedure: NASOPHARYNGOSCOPY EUSTATION TUBE BALLOON DILATION WITH OUTFRACTURE OF TURBINATES;  Surgeon: Bud Face, MD;  Location: [facility];  Service: ENT;  Laterality: Left;   SPINE SURGERY N/A    TOTAL HIP ARTHROPLASTY Right 03/26/2021   Procedure: TOTAL HIP ARTHROPLASTY ANTERIOR APPROACH;  Surgeon: Kennedy Bucker, MD;  Location: [facility] ORS;  Service: Orthopedics;  Laterality: Right;   VASECTOMY      Current Outpatient Medications  Medication Instructions   acetaminophen (TYLENOL) 500 mg, Oral, Every 6 hours PRN   albuterol (VENTOLIN HFA) 108 (90 Base) MCG/ACT inhaler 2 puffs, Inhalation, Every 6 hours PRN   amiodarone (PACERONE) 200 mg, Oral, Daily   aspirin EC 81 mg, Oral, Daily   atenolol (TENORMIN) 25 mg, Oral, Daily   cholecalciferol (VITAMIN D3) 1,000 Units, Oral, Daily   latanoprost (XALATAN) 0.005 % ophthalmic solution 1 drop, Both Eyes,  Daily at bedtime   levothyroxine (SYNTHROID) 75 mcg, Oral, See admin instructions, Take 75 mcg daily Monday through Saturday, skip Sunday dose   Multiple Vitamins-Minerals (MULTIVITAMIN WITH MINERALS) tablet 1 tablet, Oral, Daily   umeclidinium bromide (INCRUSE ELLIPTA) 62.5 MCG/ACT AEPB INHALE ONE PUFF BY MOUTH DAILY    Social History:  The patient  reports that he quit smoking about 22 years ago. His smoking use included cigarettes. He started smoking about 53 years ago. He has a 46.5 pack-year smoking history. He has never used smokeless tobacco. He reports that he does not drink alcohol and does not use drugs.   Family History:  The patient's family history includes Alcoholism in his brother; Heart failure in his mother; Leukemia in his father.  ROS:  Please see the history of present illness. All other systems are reviewed and otherwise negative.   PHYSICAL EXAM:  VS:  BP 110/70 (BP Location: Left Arm, Patient Position: Sitting, Cuff Size: Normal)   Pulse (!) 51   Ht 5\' 9"  (1.753 m)  Wt 183 lb 6 oz (83.2 kg)   SpO2 98%   BMI 27.08 kg/m  BMI: Body mass index is 27.08 kg/m.  GEN- The patient is well appearing, alert and oriented x 3 today.   Lungs- Clear to ausculation bilaterally, normal work of breathing.  Heart- Regular rate and rhythm, no murmurs, rubs or gallops Extremities- Trace peripheral edema, warm, dry   EKG is ordered. Personal review of EKG from today shows:   EKG Interpretation Date/Time:  Wednesday November 23 2022 09:36:26 EDT Ventricular Rate:  51 PR Interval:  256 QRS Duration:  104 QT Interval:  474 QTC Calculation: 436 R Axis:   149  Text Interpretation: Sinus bradycardia with 1st degree A-V block Confirmed by Sherie Don [phone]) on 11/23/2022 9:40:09 AM    11/21/2022: SB w 1st deg AV block, rate 55; Low voltage PR 278    Recent Labs: 02/07/2022: Magnesium 2.0 11/21/2022: BUN 19; Creatinine, Ser 0.98; Hemoglobin 15.3; Platelets 180; Potassium 4.3;  Sodium 141  No results found for requested labs within last 365 days.   Estimated Creatinine Clearance: 69.1 mL/min (by C-G formula based on SCr of 0.98 mg/dL).   Wt Readings from Last 3 Encounters:  11/23/22 183 lb 6 oz (83.2 kg)  10/20/22 178 lb (80.7 kg)  05/25/22 182 lb (82.6 kg)     Additional studies reviewed include: Previous EP, cardiology notes.   TTE, 10/22/2020  1. Left ventricular ejection fraction, by estimation, is 55 to 60%. The left ventricle has normal function. The left ventricle has no regional wall motion abnormalities. Left ventricular diastolic parameters are consistent with Grade I diastolic dysfunction (impaired relaxation).   2. Right ventricular systolic function is normal. The right ventricular size is normal. Tricuspid regurgitation signal is inadequate for assessing PA pressure.   3. The mitral valve is normal in structure. Mild mitral valve regurgitation. No evidence of mitral stenosis.   4. The aortic valve was not well visualized. Aortic valve regurgitation is mild.   TTE, 06/22/2017 - Left ventricle: The cavity size was normal. Systolic function was normal. The estimated ejection fraction was in the range of 55% to 60%. Wall motion was normal; there were no regional wall motion abnormalities. Features are consistent with a pseudonormal left ventricular filling pattern, with concomitant abnormal relaxation and increased filling pressure (grade 2 diastolic dysfunction).  - Ascending aorta: The ascending aorta was moderately dilated, 4.4 cm  - Mitral valve: Structurally normal valve. Unable to exclude mild prolapse. There was mild regurgitation.  - Left atrium: The atrium was mildly dilated.  - Right ventricle: Systolic function was normal.  - Pulmonary arteries: Systolic pressure was within the normal range.   LHC, 05/29/2017 1.  Normal coronary arteries. 2.  Very unusual horizontal orientation of the heart.  I did not do left ventricular angiography given  frequent PVCs and unusual orientation of the pigtail catheter.  ASSESSMENT AND PLAN:  #) persis AFib #) bradycardia Maintaining sinus on 200mg  amiodarone daily Bradycardic, though asymptomatic Update LFTs and thyroid labs today ChadsVasc score = 1, so no OAC at this time.   #) chest pain Recent ER visit for ongoing chest pain Trops normal Most recent LHC 2019 with no CAD Lipid profile earlier this year with elevated triglycerides, unsure if fasting labs Obtain myocardial perfusion to further eval  Informed Consent   Shared Decision Making/Informed Consent The risks [chest pain, shortness of breath, cardiac arrhythmias, dizziness, blood pressure fluctuations, myocardial infarction, stroke/transient ischemic attack, nausea, vomiting, allergic reaction, radiation  exposure, metallic taste sensation and life-threatening complications (estimated to be 1 in 10,000)], benefits (risk stratification, diagnosing coronary artery disease, treatment guidance) and alternatives of a nuclear stress test were discussed in detail with Mr. Goldmann and he agrees to proceed.      Current medicines are reviewed at length with the patient today.   The patient does not have concerns regarding his medicines.  The following changes were made today:  none  Labs/ tests ordered today include:  Orders Placed This Encounter  Procedures   EKG 12-Lead     Disposition: Follow up with Dr. Lalla Brothers or EP APP in 6 months  Follow up with Dr. Kirke Corin or gen cards APP in 2 months   Signed, Sherie Don, NP  11/23/22  9:41 AM  Electrophysiology [facility]

## 2022-11-23 ENCOUNTER — Encounter: Payer: Self-pay | Admitting: Cardiology

## 2022-11-23 ENCOUNTER — Ambulatory Visit: Payer: Medicare HMO | Attending: Cardiology | Admitting: Cardiology

## 2022-11-23 VITALS — BP 110/70 | HR 51 | Ht 69.0 in | Wt 183.4 lb

## 2022-11-23 DIAGNOSIS — Z79899 Other long term (current) drug therapy: Secondary | ICD-10-CM | POA: Diagnosis not present

## 2022-11-23 DIAGNOSIS — I4819 Other persistent atrial fibrillation: Secondary | ICD-10-CM

## 2022-11-23 NOTE — Patient Instructions (Signed)
Medication Instructions:  The current medical regimen is effective;  continue present plan and medications.  *If you need a refill on your cardiac medications before your next appointment, please call your pharmacy*   Lab Work: Your provider would like for you to have following labs drawn today LFT, TSH, FREE T4.   If you have labs (blood work) drawn today and your tests are completely normal, you will receive your results only by: MyChart Message (if you have MyChart) OR A paper copy in the mail If you have any lab test that is abnormal or we need to change your treatment, we will call you to review the results.  Follow-Up: At [facility], you and your health needs are our priority.  As part of our continuing mission to provide you with exceptional heart care, we have created designated Provider Care Teams.  These Care Teams include your primary Cardiologist (physician) and Advanced Practice Providers (APPs -  Physician Assistants and Nurse Practitioners) who all work together to provide you with the care you need, when you need it.  We recommend signing up for the patient portal called "MyChart".  Sign up information is provided on this After Visit Summary.  MyChart is used to connect with patients for Virtual Visits (Telemedicine).  Patients are able to view lab/test results, encounter notes, upcoming appointments, etc.  Non-urgent messages can be sent to your provider as well.   To learn more about what you can do with MyChart, go to [email or web address].    Your next appointment:   2 month(s)  Provider:   You may see Lorine Bears, MD or one of the following Advanced Practice Providers on your designated Care Team:   Nicolasa Ducking, NP Eula Listen, PA-C Cadence Fransico Michael, PA-C Charlsie Quest, NP   Then back to see Dr.Lambert or Sherie Don, NP in 6 months.

## 2022-11-24 LAB — HEPATIC FUNCTION PANEL
ALT: 14 [IU]/L (ref 0–44)
AST: 16 [IU]/L (ref 0–40)
Albumin: 4.1 g/dL (ref 3.8–4.8)
Alkaline Phosphatase: 80 [IU]/L (ref 44–121)
Bilirubin Total: 1 mg/dL (ref 0.0–1.2)
Bilirubin, Direct: 0.19 mg/dL (ref 0.00–0.40)
Total Protein: 6.2 g/dL (ref 6.0–8.5)

## 2022-11-24 LAB — T4, FREE: Free T4: 1.83 ng/dL — ABNORMAL HIGH (ref 0.82–1.77)

## 2022-11-24 LAB — TSH: TSH: 3.66 u[IU]/mL (ref 0.450–4.500)

## 2022-11-25 ENCOUNTER — Other Ambulatory Visit: Payer: Self-pay

## 2022-11-25 DIAGNOSIS — R079 Chest pain, unspecified: Secondary | ICD-10-CM

## 2022-11-28 ENCOUNTER — Telehealth: Payer: Self-pay | Admitting: Cardiovascular Disease

## 2022-11-28 NOTE — Telephone Encounter (Signed)
Patient is calling to receive results of blood test

## 2022-11-28 NOTE — Telephone Encounter (Signed)
Spoke to patient and informed him of lab results as follows:  "Labs stable, no change to plan"  Patient understood with read back

## 2022-12-01 ENCOUNTER — Ambulatory Visit: Payer: Medicare HMO | Admitting: Nurse Practitioner

## 2022-12-01 ENCOUNTER — Other Ambulatory Visit
Admission: RE | Admit: 2022-12-01 | Discharge: 2022-12-01 | Disposition: A | Discharge status: Home / Self Care | Payer: Medicare HMO | Attending: Nurse Practitioner | Admitting: Nurse Practitioner

## 2022-12-01 ENCOUNTER — Encounter: Payer: Self-pay | Admitting: Nurse Practitioner

## 2022-12-01 VITALS — BP 130/80 | HR 56 | Temp 97.6°F | Ht 69.0 in | Wt 185.4 lb

## 2022-12-01 DIAGNOSIS — J438 Other emphysema: Secondary | ICD-10-CM | POA: Diagnosis not present

## 2022-12-01 DIAGNOSIS — R911 Solitary pulmonary nodule: Secondary | ICD-10-CM

## 2022-12-01 DIAGNOSIS — G4734 Idiopathic sleep related nonobstructive alveolar hypoventilation: Secondary | ICD-10-CM | POA: Diagnosis not present

## 2022-12-01 DIAGNOSIS — J9611 Chronic respiratory failure with hypoxia: Secondary | ICD-10-CM | POA: Diagnosis not present

## 2022-12-01 DIAGNOSIS — J431 Panlobular emphysema: Secondary | ICD-10-CM

## 2022-12-01 DIAGNOSIS — I4819 Other persistent atrial fibrillation: Secondary | ICD-10-CM

## 2022-12-01 LAB — CBC WITH DIFFERENTIAL/PLATELET
Abs Immature Granulocytes: 0.05 10*3/uL (ref 0.00–0.07)
Basophils Absolute: 0.1 10*3/uL (ref 0.0–0.1)
Basophils Relative: 1 %
Eosinophils Absolute: 0.1 10*3/uL (ref 0.0–0.5)
Eosinophils Relative: 1 %
HCT: 46.9 % (ref 39.0–52.0)
Hemoglobin: 15.6 g/dL (ref 13.0–17.0)
Immature Granulocytes: 1 %
Lymphocytes Relative: 22 %
Lymphs Abs: 1.8 10*3/uL (ref 0.7–4.0)
MCH: 30.1 pg (ref 26.0–34.0)
MCHC: 33.3 g/dL (ref 30.0–36.0)
MCV: 90.4 fL (ref 80.0–100.0)
Monocytes Absolute: 0.7 10*3/uL (ref 0.1–1.0)
Monocytes Relative: 8 %
Neutro Abs: 5.6 10*3/uL (ref 1.7–7.7)
Neutrophils Relative %: 67 %
Platelets: 198 10*3/uL (ref 150–400)
RBC: 5.19 MIL/uL (ref 4.22–5.81)
RDW: 13.1 % (ref 11.5–15.5)
WBC: 8.2 10*3/uL (ref 4.0–10.5)
nRBC: 0 % (ref 0.0–0.2)

## 2022-12-01 MED ORDER — STIOLTO RESPIMAT 2.5-2.5 MCG/ACT IN AERS
2.0000 | INHALATION_SPRAY | Freq: Every day | RESPIRATORY_TRACT | 5 refills | Status: DC
Start: 2022-12-01 — End: 2023-05-24

## 2022-12-01 NOTE — Patient Instructions (Addendum)
Continue Albuterol inhaler 2 puffs every 6 hours as needed for shortness of breath or wheezing. Notify if symptoms persist despite rescue inhaler/neb use.  Stop Incruse. Start Stiolto 2 puffs daily  Continue supplemental oxygen 1 lpm at night EXCEPT for the night of your oxygen study  Repeat overnight oxygen on room air - someone will call you to set this up  Your lung nodule was stable on your recent CTA. You will need a repeat in one year.   Follow up in 6-8 weeks with Dr. Belia Heman or Philis Nettle after full PFT. If symptoms do not improve or worsen, please contact office for sooner follow up or seek emergency care.

## 2022-12-01 NOTE — Assessment & Plan Note (Signed)
Appears to have some progression of disease over the last year.  No acute symptoms to indicate exacerbation today.  Lung exam without bronchospasm.  Receives benefit from Burdett use.  Will trial step up in therapy to LAMA/LABA.  Provided with samples of Stiolto.  Medication education and teach back performed.  Side effect profile reviewed.  Check CBC with differential to assess peripheral eosinophil count.  If he continues to have difficulties, may consider addition of ICS in the future.  Will repeat PFTs.  Recent chest imaging without acute process.  Encouraged to work on graded exercises.  Action plan in place.  Patient Instructions  Continue Albuterol inhaler 2 puffs every 6 hours as needed for shortness of breath or wheezing. Notify if symptoms persist despite rescue inhaler/neb use.  Stop Incruse. Start Stiolto 2 puffs daily  Continue supplemental oxygen 1 lpm at night EXCEPT for the night of your oxygen study  Repeat overnight oxygen on room air - someone will call you to set this up  Your lung nodule was stable on your recent CTA. You will need a repeat in one year.   Follow up in 6-8 weeks with Dr. Belia Heman or Philis Nettle after full PFT. If symptoms do not improve or worsen, please contact office for sooner follow up or seek emergency care.

## 2022-12-01 NOTE — Assessment & Plan Note (Signed)
Stable 5 mm LUL nodule 09/2022. Repeat in one year.

## 2022-12-01 NOTE — Assessment & Plan Note (Signed)
He is not entirely convinced that he needs to continue using supplemental oxygen at home.  Willing to repeat overnight oxygen study. ONO on room air order placed.  He would be open to continuing therapy if this shows persistent nocturnal hypoxia. Goal >88-90%. No daytime/exertional O2 requirements.

## 2022-12-01 NOTE — Assessment & Plan Note (Addendum)
NSR today. On amiodarone. PFTs for monitoring. Follow up with cardiology. No evidence of lung toxicity on imaging.

## 2022-12-01 NOTE — Progress Notes (Signed)
@Patient  ID: Bobby Flowers, male    DOB: Jan 22, 1951, 72 y.o.   MRN: [id number]  Chief Complaint  Patient presents with   Follow-up    Dry cough, occasional shortness of breath and wheezing. Seen at urgent care on 09/18/22 treated with prednisone and doxy for bronchitis.     Referring provider: Jerl Mina, MD  HPI: 72 year old male, former smoker followed for emphysema and nocturnal hypoxia on supplemental O2. He is a patient of Dr. Clovis Fredrickson and last seen in office 10/26/2021. Past medical history significant for migraine, MVP, PAF on amiodarone, thoracic aortic aneurysm without rupture, angina, thyroid disease, chronic pain syndrome, open-angle glaucoma.  TEST/EVENTS:  11/20/2020 PFT without postbronchodilator spirometry: FVC 79, FEV1 87, ratio 74, TLC 81, DLCO 100 10/20/2022 CT angio chest aorta: Stable 4.6 cm ascending thoracic aortic aneurysm.  Mild cardiomegaly.  Emphysematous disease.  Stable 5 mm left upper lobe nodule.  Stable probable scarring in left upper lobe.  Stable probable hemangioma.  Stable hepatic cysts. 11/21/2022 CXR: Unchanged mild linear opacities of mid left lung, scarring.  Lungs otherwise clear.  10/26/2021: OV with Dr. Belia Heman.  History of PAF and mitral valve prolapse.  Briefly on warfarin however had an allergic reaction.  Has not been on any anticoagulant since then given his low CHADS2 VASc score.  He has been maintained on amiodarone.  Initially referred to assess if pulmonary toxicity from amiodarone contributing to dyspnea.  No evidence on CT scan of this.  On supplemental oxygen 1 L/min at night.  Using Incruse.  Had an exacerbation several months ago that responded well to prednisone and antibiotics.  Recommend follow-up CT chest for pulmonary nodules in 1 year.  12/01/2022: Today-follow-up Patient presents today for follow-up.  He had a recent CT angio chest aorta with stable thoracic aortic aneurysm.  He also had stable 5 mm left upper lobe nodule.  His last  exacerbation was in July 2024.  He was seen in urgent care and treated with prednisone and doxycycline for bronchitis.  Recovered well after this.  No hospitalizations.  He did go to the emergency department a few weeks ago due to intermittent chest pain, which workup was unremarkable for.  This did seem to be a chronic problem for him based on the notes.  Not actively having any chest pain today.  Understands ED precautions. Breathing has been relatively stable although he finds that he is getting a little more short of breath with singing and uphill climbing.  Able to walk on level surface without any difficulties and complete ADLs without trouble.  He does use his albuterol 3-4 times a week.  Has an occasional dry cough that is not bothersome.  Every now and then notices some wheezing.  He does respond well to the albuterol.  Feels like it opens up his chest more.  Does help him when he is singing.  Denies any fevers, chills, hemoptysis, anorexia, weight loss, lower extremity swelling, sinus symptoms.  He is prescribed supplemental oxygen at night but he is not entirely convinced that he due to this.  He does not feel he has noticed any difference being on it.  He also will wake up frequently and it is not on.  Wants to know if he needs to continue this.  Has not had any low oxygen levels during the day or with activity.  Allergies  Allergen Reactions   Adhesive [Tape] Rash    Paper tape okay   Coumadin [Warfarin] Rash  There is no immunization history on file for this patient.  Past Medical History:  Diagnosis Date   Chest tightness    a. 05/2017 Cath: nl cors.   Chronic pain of left lower extremity    right   COPD (chronic obstructive pulmonary disease) (HCC)    DDD (degenerative disc disease), lumbosacral    Dilated aortic root (HCC)    a. 06/2017 Echo: Ao root 4.4cm.   Dysrhythmia    afib   Headache    migraines in past. none for 6-8 yrs.   History of kidney stones     Hypothyroidism    Mitral valve prolapse    a. 09/2015 Echo: EF 55-60%, no rwma, nl LA size, nl RV fxn, nl PASP; b. 06/2017 Echo: EF 55-60%, no rwma, Gr2 DD. Asc Ao 4.4cm. Mild MR (? mild prolapse). Mildly dil LA.   PAF (paroxysmal atrial fibrillation) (HCC)    a. Dx in 1980's; b. CHA2DS2VASc = 1 - briefly on coumadin many years ago-->caused rash; c. Chronic Amio 200mg  QD.   Wears contact lenses     Tobacco History: Social History   Tobacco Use  Smoking Status Former   Current packs/day: 0.00   Average packs/day: 1.5 packs/day for 31.0 years (46.5 ttl pk-yrs)   Types: Cigarettes   Start date: 07/14/1969   Quit date: 07/14/2000   Years since quitting: 22.3  Smokeless Tobacco Never   Counseling given: Not Answered   Outpatient Medications Prior to Visit  Medication Sig Dispense Refill   acetaminophen (TYLENOL) 500 MG tablet Take 500 mg by mouth every 6 (six) hours as needed for moderate pain or headache.     albuterol (VENTOLIN HFA) 108 (90 Base) MCG/ACT inhaler Inhale 2 puffs into the lungs every 6 (six) hours as needed for wheezing or shortness of breath. 8 g 2   amiodarone (PACERONE) 200 MG tablet Take 1 tablet (200 mg total) by mouth daily. 90 tablet 3   aspirin 81 MG EC tablet Take 81 mg by mouth daily.     atenolol (TENORMIN) 25 MG tablet TAKE 1 TABLET BY MOUTH DAILY 90 tablet 1   cholecalciferol (VITAMIN D3) 25 MCG (1000 UNIT) tablet Take 1,000 Units by mouth daily.     latanoprost (XALATAN) 0.005 % ophthalmic solution Place 1 drop into both eyes at bedtime.      levothyroxine (SYNTHROID, LEVOTHROID) 75 MCG tablet Take 75 mcg by mouth See admin instructions. Take 75 mcg daily Monday through Saturday, skip Sunday dose     Multiple Vitamins-Minerals (MULTIVITAMIN WITH MINERALS) tablet Take 1 tablet by mouth daily.     umeclidinium bromide (INCRUSE ELLIPTA) 62.5 MCG/ACT AEPB INHALE ONE PUFF BY MOUTH DAILY 30 each 6   No facility-administered medications prior to visit.      Review of Systems:   Constitutional: No weight loss or gain, night sweats, fevers, chills, fatigue, or lassitude. HEENT: No headaches, difficulty swallowing, tooth/dental problems, or sore throat. No sneezing, itching, ear ache, nasal congestion, or post nasal drip CV:  No chest pain, orthopnea, PND, swelling in lower extremities, anasarca, dizziness, palpitations, syncope Resp: +shortness of breath with exertion; occasional dry cough; occasional wheeze. No excess mucus or change in color of mucus.  No hemoptysis. No chest wall deformity GI:  No heartburn, indigestion GU: No dysuria, change in color of urine, urgency or frequency.  Skin: No rash, lesions, ulcerations MSK:  No joint pain or swelling.   Neuro: No dizziness or lightheadedness.  Psych: No depression or anxiety.  Mood stable.     Physical Exam:  BP 130/80 (BP Location: Left Arm, Patient Position: Sitting, Cuff Size: Normal)   Pulse (!) 56   Temp 97.6 F (36.4 C) (Temporal)   Ht 5\' 9"  (1.753 m)   Wt 185 lb 6.4 oz (84.1 kg)   SpO2 97%   BMI 27.38 kg/m   GEN: Pleasant, interactive, well-appearing; in no acute distress. HEENT:  Normocephalic and atraumatic. PERRLA. Sclera white. Nasal turbinates pink, moist and patent bilaterally. No rhinorrhea present. Oropharynx pink and moist, without exudate or edema. No lesions, ulcerations, or postnasal drip.  NECK:  Supple w/ fair ROM. No JVD present. Normal carotid impulses w/o bruits. Thyroid symmetrical with no goiter or nodules palpated. No lymphadenopathy.   CV: RRR, no m/r/g, no peripheral edema. Pulses intact, +2 bilaterally. No cyanosis, pallor or clubbing. PULMONARY:  Unlabored, regular breathing. Diminished bilaterally A&P w/o wheezes/rales/rhonchi. No accessory muscle use.  GI: BS present and normoactive. Soft, non-tender to palpation. No organomegaly or masses detected. MSK: No erythema, warmth or tenderness. Cap refil <2 sec all extrem. No deformities or joint  swelling noted.  Neuro: A/Ox3. No focal deficits noted.   Skin: Warm, no lesions or rashe Psych: Normal affect and behavior. Judgement and thought content appropriate.     Lab Results:  CBC    Component Value Date/Time   WBC 8.2 12/01/2022 0928   RBC 5.19 12/01/2022 0928   HGB 15.6 12/01/2022 0928   HGB 15.1 08/29/2018 1524   HCT 46.9 12/01/2022 0928   HCT 45.0 08/29/2018 1524   PLT 198 12/01/2022 0928   PLT 174 08/29/2018 1524   MCV 90.4 12/01/2022 0928   MCV 90 08/29/2018 1524   MCV 88 04/20/2013 1611   MCH 30.1 12/01/2022 0928   MCHC 33.3 12/01/2022 0928   RDW 13.1 12/01/2022 0928   RDW 12.9 08/29/2018 1524   RDW 13.8 04/20/2013 1611   LYMPHSABS 1.8 12/01/2022 0928   LYMPHSABS 1.4 04/20/2013 1611   MONOABS 0.7 12/01/2022 0928   MONOABS 0.5 04/20/2013 1611   EOSABS 0.1 12/01/2022 0928   EOSABS 0.1 04/20/2013 1611   BASOSABS 0.1 12/01/2022 0928   BASOSABS 0.0 04/20/2013 1611    BMET    Component Value Date/Time   NA 141 11/21/2022 1542   NA 141 02/07/2022 0841   NA 139 04/20/2013 1611   K 4.3 11/21/2022 1542   K 3.9 04/20/2013 1611   CL 105 11/21/2022 1542   CL 106 04/20/2013 1611   CO2 28 11/21/2022 1542   CO2 31 04/20/2013 1611   GLUCOSE 133 (H) 11/21/2022 1542   GLUCOSE 114 (H) 04/20/2013 1611   BUN 19 11/21/2022 1542   BUN 15 02/24/2021 0923   BUN 22 (H) 04/20/2013 1611   CREATININE 0.98 11/21/2022 1542   CREATININE 1.19 04/20/2013 1611   CALCIUM 8.9 11/21/2022 1542   CALCIUM 8.4 (L) 04/20/2013 1611   GFRNONAA >60 11/21/2022 1542   GFRNONAA >60 04/20/2013 1611   GFRAA >60 10/14/2019 0853   GFRAA >60 04/20/2013 1611    BNP No results found for: "BNP"   Imaging:  DG Chest 2 View  Result Date: 11/21/2022 CLINICAL DATA:  Chest pain EXAM: CHEST - 2 VIEW COMPARISON:  Chest x-ray dated April 2nd 2019 FINDINGS: Cardiac and mediastinal contours are unchanged. Unchanged mild linear opacities of the mid left lung, likely due to scarring. Lungs are  otherwise clear. No evidence of pleural effusion or pneumothorax. IMPRESSION: No acute cardiopulmonary abnormality. Electronically Signed  By: Allegra Lai M.D.   On: 11/21/2022 19:00    Administration History     None           No data to display          No results found for: "NITRICOXIDE"      Assessment & Plan:   Paraseptal emphysema (HCC) Appears to have some progression of disease over the last year.  No acute symptoms to indicate exacerbation today.  Lung exam without bronchospasm.  Receives benefit from [place] use.  Will trial step up in therapy to LAMA/LABA.  Provided with samples of Stiolto.  Medication education and teach back performed.  Side effect profile reviewed.  Check CBC with differential to assess peripheral eosinophil count.  If he continues to have difficulties, may consider addition of ICS in the future.  Will repeat PFTs.  Recent chest imaging without acute process.  Encouraged to work on graded exercises.  Action plan in place.  Patient Instructions  Continue Albuterol inhaler 2 puffs every 6 hours as needed for shortness of breath or wheezing. Notify if symptoms persist despite rescue inhaler/neb use.  Stop Incruse. Start Stiolto 2 puffs daily  Continue supplemental oxygen 1 lpm at night EXCEPT for the night of your oxygen study  Repeat overnight oxygen on room air - someone will call you to set this up  Your lung nodule was stable on your recent CTA. You will need a repeat in one year.   Follow up in 6-8 weeks with Dr. Belia Heman or Philis Nettle after full PFT. If symptoms do not improve or worsen, please contact office for sooner follow up or seek emergency care.    Nocturnal hypoxia He is not entirely convinced that he needs to continue using supplemental oxygen at home.  Willing to repeat overnight oxygen study. ONO on room air order placed.  He would be open to continuing therapy if this shows persistent nocturnal hypoxia. Goal >88-90%. No  daytime/exertional O2 requirements.   Persistent atrial fibrillation (HCC) NSR today. On amiodarone. PFTs for monitoring. Follow up with cardiology. No evidence of lung toxicity on imaging.   Lung nodule Stable 5 mm LUL nodule 09/2022. Repeat in one year.    I spent 35 minutes of dedicated to the care of this patient on the date of this encounter to include pre-visit review of records, face-to-face time with the patient discussing conditions above, post visit ordering of testing, clinical documentation with the electronic health record, making appropriate referrals as documented, and communicating necessary findings to members of the patients care team.  Noemi Chapel, NP 12/01/2022  Pt aware and understands NP's role.

## 2022-12-02 ENCOUNTER — Encounter
Admission: RE | Admit: 2022-12-02 | Discharge: 2022-12-02 | Disposition: A | Discharge status: Home / Self Care | Payer: Medicare HMO | Source: Ambulatory Visit | Attending: Cardiology | Admitting: Cardiology

## 2022-12-02 DIAGNOSIS — R079 Chest pain, unspecified: Secondary | ICD-10-CM

## 2022-12-02 LAB — NM MYOCAR MULTI W/SPECT W/WALL MOTION / EF
LV dias vol: 128 mL (ref 62–150)
LV sys vol: 48 mL
Nuc Stress EF: 66 %
Peak HR: 74 {beats}/min
Percent HR: 49 %
Rest HR: 51 {beats}/min
Rest Nuclear Isotope Dose: 10.5 mCi
SDS: 5
SRS: 2
SSS: 7
ST Depression (mm): 0 mm
Stress Nuclear Isotope Dose: 32.5 mCi
TID: 0.82

## 2022-12-02 MED ORDER — REGADENOSON 0.4 MG/5ML IV SOLN
0.4000 mg | Freq: Once | INTRAVENOUS | Status: AC
  Administered 2022-12-02: 0.4 mg via INTRAVENOUS

## 2022-12-02 MED ORDER — TECHNETIUM TC 99M TETROFOSMIN IV KIT
10.0000 | PACK | Freq: Once | INTRAVENOUS | Status: AC | PRN
  Administered 2022-12-02: 10.53 via INTRAVENOUS

## 2022-12-02 MED ORDER — TECHNETIUM TC 99M TETROFOSMIN IV KIT
32.4700 | PACK | Freq: Once | INTRAVENOUS | Status: AC | PRN
  Administered 2022-12-02: 32.47 via INTRAVENOUS

## 2022-12-06 ENCOUNTER — Telehealth: Payer: Self-pay

## 2022-12-06 NOTE — Telephone Encounter (Signed)
Sherie Don, NP  P Cv Div [place] Triage Scan is low risk. No significant coronary artery calcification. However, if he is still have chest pain episodes, we should do further eval.  He is scheduled to see me 12/3, let's instead schedule with [facility] or [facility] APP for further eval of chest pain, if he is still having symptoms.Marland Kitchen He was due to see [facility] 08/2022. Please reschedule EP follow-up to 05/2022 with me.

## 2022-12-06 NOTE — Progress Notes (Deleted)
Cardiology Office Note    Date:  12/06/2022   ID:  Bobby Flowers, DOB Apr 08, 1950, MRN [id number]  PCP:  Jerl Mina, MD  Cardiologist:  Lorine Bears, MD  Electrophysiologist:  Lanier Prude, MD   Chief Complaint: ***  History of Present Illness:   Bobby Flowers is a 72 y.o. 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  He was previously on warfarin, but had allergic reaction with rash.  He has not been maintained on OAC since given his low CHA2DS2-VASc score.  He has been followed by EP with consideration for possible catheter ablation in 2015, though given stability of atrial arrhythmia on low-dose amiodarone, medical therapy was continued.  He had recurrent A-fib in 2016, but converted to sinus rhythm following titration of amiodarone dose.  LHC in 2019 showed normal coronary arteries with very unusual horizontal orientation of the heart.  He had worsening shortness of breath in 2022 with concern for possible amiodarone induced lung toxicity.  CT of the chest was suggestive of emphysema with no evidence of interstitial lung disease.  He was again evaluated by EP to discuss possibility of A-fib ablation, but elected to continue amiodarone.  With regards to his ascending thoracic aortic aneurysm, he is followed by CVTS with CTA chest/aorta in 09/2022 showing a stable 4.6 cm ascending thoracic aortic aneurysm.  He was seen in the ED on 11/21/2022 with a 1 week history of intermittent left-sided chest pain without radiation that was constant with occasional exacerbation.  High-sensitivity troponin 5 with a delta troponin of 3.  Chest x-ray without acute cardiopulmonary process.  EKG shows sinus bradycardia with first-degree AV block and  nonspecific changes.  He followed up with EP on 11/23/2022 and reported some exertional and rest chest discomfort that was at times associated with dyspnea.  He also reported some decreased exercise tolerance.  Given symptoms, he underwent Lexiscan MPI on 12/02/2022 that showed no evidence of significant ischemia or scar with perfusion imaging suboptimal due to motion, extracardiac activity, and positioning of the heart and thorax with known atypical orientation of the heart.  LVEF 60 to 70%.  No significant coronary artery calcification.  Dilatation of the ascending thoracic aorta was again noted.  Overall, this was a low risk study.  ***   Labs independently reviewed: 11/2022 - Hgb 15.6, PLT 198, TSH normal, free T4 elevated at 1.83, albumin 4.1, AST/ALT normal, potassium 4.3, BUN 19, serum creatinine 0.98 02/2022 - TC 159, TG 270, HDL 37, LDL 78  Past Medical History:  Diagnosis Date   Chest tightness    a. 05/2017 Cath: nl cors.   Chronic pain of left lower extremity    right   COPD (chronic obstructive pulmonary disease) (HCC)    DDD (degenerative disc disease), lumbosacral    Dilated aortic root (HCC)    a. 06/2017 Echo: Ao root 4.4cm.   Dysrhythmia    afib   Headache    migraines in past. none for 6-8 yrs.   History of kidney stones    Hypothyroidism    Mitral valve prolapse    a. 09/2015 Echo: EF 55-60%, no rwma, nl LA size, nl RV fxn, nl PASP; b. 06/2017 Echo: EF 55-60%, no rwma, Gr2 DD.  Asc Ao 4.4cm. Mild MR (? mild prolapse). Mildly dil LA.   PAF (paroxysmal atrial fibrillation) (HCC)    a. Dx in 1980's; b. CHA2DS2VASc = 1 - briefly on coumadin many years ago-->caused rash; c. Chronic Amio 200mg  QD.   Wears contact lenses     Past Surgical History:  Procedure Laterality Date   CARDIAC CATHETERIZATION     COLONOSCOPY  04/25/2005   COLONOSCOPY WITH PROPOFOL N/A 07/22/2015   Procedure: COLONOSCOPY WITH PROPOFOL;  Surgeon: Earline Mayotte, MD;  Location: [facility] END[facility];   Service: Endoscopy;  Laterality: N/A;   COLONOSCOPY WITH PROPOFOL N/A 09/04/2020   Procedure: COLONOSCOPY WITH PROPOFOL;  Surgeon: Earline Mayotte, MD;  Location: [facility] ENDOSCOPY;  Service: Endoscopy;  Laterality: N/A;   CYSTOSCOPY WITH HOLMIUM LASER LITHOTRIPSY Bilateral 07/28/2017   Procedure: CYSTOSCOPY WITH HOLMIUM LASER LITHOTRIPSY;  Surgeon: Crista Elliot, MD;  Location: [facility] ORS;  Service: Urology;  Laterality: Bilateral;   CYSTOSCOPY WITH STENT PLACEMENT Bilateral 07/28/2017   Procedure: CYSTOSCOPY WITH STENT PLACEMENT;  Surgeon: Crista Elliot, MD;  Location: [facility] ORS;  Service: Urology;  Laterality: Bilateral;   CYSTOSCOPY WITH URETEROSCOPY Bilateral 07/28/2017   Procedure: CYSTOSCOPY WITH URETEROSCOPY;  Surgeon: Crista Elliot, MD;  Location: [facility] ORS;  Service: Urology;  Laterality: Bilateral;   CYSTOSCOPY/URETEROSCOPY/HOLMIUM LASER/STENT PLACEMENT Left 11/09/2021   Procedure: CYSTOSCOPY/URETEROSCOPY/HOLMIUM LASER/STENT PLACEMENT;  Surgeon: Riki Altes, MD;  Location: [facility] ORS;  Service: Urology;  Laterality: Left;   EYE SURGERY Left    cataract surgery   HEMORROIDECTOMY     HERNIA REPAIR Left 05/22/2015   Left inguinal hernia repair with medium Ultra Pro mesh   INGUINAL HERNIA REPAIR Left 07/22/2015   Procedure: HERNIA REPAIR INGUINAL ADULT;  Surgeon: Earline Mayotte, MD;  Location: [facility] ORS;  Service: General;  Laterality: Left;   LEFT HEART CATH AND CORONARY ANGIOGRAPHY Left 05/29/2017   Procedure: LEFT HEART CATH AND CORONARY ANGIOGRAPHY;  Surgeon: Iran Ouch, MD;  Location: [facility] INVASIVE CV LAB;  Service: Cardiovascular;  Laterality: Left;   LUMBAR FUSION N/A    LUMBAR LAMINECTOMY/DECOMPRESSION MICRODISCECTOMY Right 07/01/2019   Procedure: OPEN L4 LAMINECTOMY, L3/4 DISCECTOMY ON RIGHT, L4/5 DISCECTOMY;  Surgeon: Lucy Chris, MD;  Location: [facility] ORS;  Service: Neurosurgery;  Laterality: Right;   myringotomy     with tubes x 5.  tubes fall out  frequently   MYRINGOTOMY WITH TUBE PLACEMENT Left 12/13/2018   Procedure: MYRINGOTOMY WITH T-TUBE PLACEMENT;  Surgeon: Bud Face, MD;  Location: [facility];  Service: ENT;  Laterality: Left;  needs to stay first case vaught / juengel doing case together   NASAL SINUS SURGERY     NASOPHARYNGOSCOPY EUSTATION TUBE BALLOON DILATION Left 12/13/2018   Procedure: NASOPHARYNGOSCOPY EUSTATION TUBE BALLOON DILATION WITH OUTFRACTURE OF TURBINATES;  Surgeon: Bud Face, MD;  Location: [facility];  Service: ENT;  Laterality: Left;   SPINE SURGERY N/A    TOTAL HIP ARTHROPLASTY Right 03/26/2021   Procedure: TOTAL HIP ARTHROPLASTY ANTERIOR APPROACH;  Surgeon: Kennedy Bucker, MD;  Location: [facility] ORS;  Service: Orthopedics;  Laterality: Right;   VASECTOMY      Current Medications: No outpatient medications have been marked as taking for the 12/07/22 encounter (Appointment) with Sondra Barges, PA-C.    Allergies:   Adhesive [tape] and Coumadin [warfarin]   Social History   Socioeconomic History   Marital status: Married    Spouse name: linda   Number of children: Not on file  Years of education: Not on file   Highest education level: Not on file  Occupational History   Occupation: customer service rep    Employer: [employer]  Tobacco Use   Smoking status: Former    Current packs/day: 0.00    Average packs/day: 1.5 packs/day for 31.0 years (46.5 ttl pk-yrs)    Types: Cigarettes    Start date: 07/14/1969    Quit date: 07/14/2000    Years since quitting: 22.4   Smokeless tobacco: Never  Vaping Use   Vaping status: Never Used  Substance and Sexual Activity   Alcohol use: No   Drug use: No   Sexual activity: Not Currently  Other Topics Concern   Not on file  Social History Narrative   Patient lives with wife.   Social Determinants of Health   Financial Resource Strain: Patient Declined (03/09/2022)   Received from [facility], [facility]   Overall Financial Resource Strain (CARDIA)    Difficulty of Paying Living Expenses: Patient declined  Food Insecurity: Patient Declined (03/09/2022)   Received from [facility], [facility]   Hunger Vital Sign    Worried About Running Out of Food in the Last Year: Patient declined    Ran Out of Food in the Last Year: Patient declined  Transportation Needs: Patient Declined (03/09/2022)   Received from [facility], [facility]   [facility] - Transportation    In the past 12 months, has lack of transportation kept you from medical appointments or from getting medications?: Patient declined    Lack of Transportation (Non-Medical): Patient declined  Physical Activity: Not on file  Stress: Not on file  Social Connections: Not on file     Family History:  The patient's family history includes Alcoholism in his brother; Heart failure in his mother; Leukemia in his father.  ROS:   12-point review of systems is negative unless otherwise noted in the HPI.   EKGs/Labs/Other Studies Reviewed:    Studies reviewed were summarized above. The additional studies were reviewed today:  Lexiscan MPI 12/02/2022:   Probably normal pharmacologic myocardial perfusion stress test.   There is no evidence of significant ischemia or scar, though perfusion images are suboptimal due to motion, extracardiac activity, and positioning of the heart in the thorax.   Left ventricular systolic function is normal (LVEF 60-70%).   There is no significant coronary artery calcification.  Dilation of the ascending aorta is again noted, better evaluated on the CTA chest from 10/20/2022.   Hypodensities in the visualized liver most likely represent cysts.   This is a low risk study.  If clinical concern for ischemic heart disease persists, consider coronary CTA, myocardial PET/CT, or cardiac catheterization. __________  2D echo  10/22/2020: 1. Left ventricular ejection fraction, by estimation, is 55 to 60%. The  left ventricle has normal function. The left ventricle has no regional  wall motion abnormalities. Left ventricular diastolic parameters are  consistent with Grade I diastolic  dysfunction (impaired relaxation).   2. Right ventricular systolic function is normal. The right ventricular  size is normal. Tricuspid regurgitation signal is inadequate for assessing  PA pressure.   3. The mitral valve is normal in structure. Mild mitral valve  regurgitation. No evidence of mitral stenosis.   4. The aortic valve was not well visualized. Aortic valve regurgitation  is mild.  __________  2D echo 06/22/2017: - Left ventricle: The cavity size was  normal. Systolic function was    normal. The estimated ejection fraction was in the range of 55%    to 60%. Wall motion was normal; there were no regional wall    motion abnormalities. Features are consistent with a pseudonormal    left ventricular filling pattern, with concomitant abnormal    relaxation and increased filling pressure (grade 2 diastolic    dysfunction).  - Ascending aorta: The ascending aorta was moderately dilated, 4.4    cm  - Mitral valve: Structurally normal valve. Unable to exclude mild    prolapse. There was mild regurgitation.  - Left atrium: The atrium was mildly dilated.  - Right ventricle: Systolic function was normal.  - Pulmonary arteries: Systolic pressure was within the normal    range.  __________  [facility] 05/29/2017: 1.  Normal coronary arteries. 2.  Very unusual horizontal orientation of the heart.  I did not do left ventricular angiography given frequent PVCs and unusual orientation of the pigtail catheter.   Recommendations: Continue medical therapy. Obtain an echocardiogram to evaluate LV systolic function. __________  2D echo 10/09/2015: - Left ventricle: The cavity size was normal. Systolic function was    normal. The estimated  ejection fraction was in the range of 55%    to 60%. Wall motion was normal; there were no regional wall    motion abnormalities. Left ventricular diastolic function    parameters were normal.  - Left atrium: The atrium was normal in size.  - Right ventricle: Systolic function was normal.  - Pulmonary arteries: Systolic pressure was within the normal    range.   Impressions:   - Challenging image quality. Patient had an atypical orientation to    his heart. Scanned from non-traditional viewing planes. Consider    CT scan of the chest if clinically indicated.   Recommendations:  Rhythm is normal sinus.  __________  2D echo 07/09/2013: - Left ventricle: The cavity size was normal. Wall thickness was    normal. Systolic function was normal. The estimated ejection    fraction was in the range of 55% to 60%. Wall motion was normal;    there were no regional wall motion abnormalities. Doppler    parameters are consistent with abnormal left ventricular    relaxation (grade 1 diastolic dysfunction).  - Mitral valve: Moderate prolapse, involving the anterior leaflet    and the posterior leaflet. There was mild regurgitation.  - Left atrium: The atrium was mildly dilated.  - Right atrium: The atrium was mildly dilated.  - Pulmonary arteries: Systolic pressure was within the normal    range.    EKG:  EKG is ordered today.  The EKG ordered today demonstrates ***  Recent Labs: 02/07/2022: Magnesium 2.0 11/21/2022: BUN 19; Creatinine, Ser 0.98; Potassium 4.3; Sodium 141 11/23/2022: ALT 14; TSH 3.660 12/01/2022: Hemoglobin 15.6; Platelets 198  Recent Lipid Panel No results found for: "CHOL", "TRIG", "HDL", "CHOLHDL", "VLDL", "LDLCALC", "LDLDIRECT"  PHYSICAL EXAM:    VS:  There were no vitals taken for this visit.  BMI: There is no height or weight on file to calculate BMI.  Physical Exam  Wt Readings from Last 3 Encounters:  12/01/22 185 lb 6.4 oz (84.1 kg)  11/23/22 183 lb 6 oz  (83.2 kg)  10/20/22 178 lb (80.7 kg)     ASSESSMENT & PLAN:   ***  Persistent A-fib: ***.  Given a CHA2DS2-VASc of 1 (age x 1), he has not been maintained on OAC.  Managed by EP.  Mitral valve disease with mitral valve prolapse:  Ascending thoracic aortic aneurysm: Stable on CTA chest/aorta in 09/2022 measuring 4.6 cm.  Followed by CVTS.   {Are you ordering a CV Procedure (e.g. stress test, cath, DCCV, TEE, etc)?   Press F2        :[id number]}     Disposition: F/u with Dr. Kirke Corin or an APP in ***, and EP as directed.   Medication Adjustments/Labs and Tests Ordered: Current medicines are reviewed at length with the patient today.  Concerns regarding medicines are outlined above. Medication changes, Labs and Tests ordered today are summarized above and listed in the Patient Instructions accessible in Encounters.   Signed, Eula Listen, PA-C 12/06/2022 5:20 PM     [facility] [place] [address] [place], [place] [address] [phone]

## 2022-12-06 NOTE — Telephone Encounter (Signed)
Spoke w/ pt and reviewed results. He reports no further episodes of chest pain. Advised him that I will make scheduling aware of Suzann's recommendation and they will call him back.

## 2022-12-07 ENCOUNTER — Ambulatory Visit: Payer: Medicare HMO | Admitting: Physician Assistant

## 2023-01-03 ENCOUNTER — Ambulatory Visit: Payer: Medicare HMO | Admitting: Internal Medicine

## 2023-01-18 ENCOUNTER — Telehealth: Payer: Self-pay | Admitting: Nurse Practitioner

## 2023-01-18 DIAGNOSIS — G4734 Idiopathic sleep related nonobstructive alveolar hypoventilation: Secondary | ICD-10-CM

## 2023-01-23 NOTE — Telephone Encounter (Signed)
I have notified the patient and placed the order to d/c the oxygen.  Nothing further needed.

## 2023-01-24 ENCOUNTER — Ambulatory Visit: Payer: Medicare HMO | Admitting: Cardiology

## 2023-01-24 NOTE — Progress Notes (Signed)
[facility] Pulmonary Medicine Consultation      Date: 01/24/2023,   MRN# [id number] DIOVANNI HOOFNAGLE 05/30/1950    SYNOPSIS  72 y.o. male who presents for complaints of SOB H/o persistent atrial fibrillation  and mitral valve prolapse with mild regurgitation.  He has prolonged history of atrial fibrillation which was diagnosed in the 34s.  He was briefly on warfarin but had an allergic reaction with a rash. He has not been on any anticoagulant since then given his low CHADS2 VASc score.  He quit smoking in 2002 and does not consume alcohol. In 2015, patient did not undergo ablation therapy and was given low dose AMIODARONE He had recurrent atrial fibrillation in 2016 but he converted back to sinus rhythm after increasing the dose of amiodarone.  He had left heart catheterization in 2019 which showed normal coronary arteries with very unusual horizontal orientation of the heart. Echocardiogram 2019 showed normal LV systolic function with moderately dilated ascending aorta at 4.4 cm. His atrial fibrillation has been well controlled with amiodarone 200 mg once daily. He had back surgery twice last year and feels that he became deconditioned after that.   Since then he has been having DOE and SOB No previous DX of COPD or PNEUMONIA  PFT's 10/2020 NL RATIO FEV1 87% predicted FVC 79% predicted  PFT's pending        CHIEF COMPLAINT:  Follow up assessment of SOB Assessment of AMIO therapy Follow up abnormal CT chest   HPI COPD seems stable at this time Patient started on Stiolto however has missed several doses and does not seem that it helps Albuterol as needed helps the most No exacerbation at this time No evidence of heart failure at this time No evidence or signs of infection at this time No respiratory distress No fevers, chills, nausea, vomiting, diarrhea No evidence of lower extremity edema No evidence hemoptysis      CT chest reviewed with patient in  detail   CT of the chest from August 2022 reviewed with patient Bilateral paraseptal cystic changes consistent with emphysema There are several subcentimeter pulmonary nodules bilaterally         CT of the chest 10/2021 Independent review today 10/26/2021 Bilateral paraseptal cystic changes consistent with emphysema There are several subcentimeter pulmonary nodules bilaterally +PULM NODULES        CT chest CT chest August 2024  CT chest August 2024  Independently reviewed today in detail with the patient bilateral paraseptal cystic changes consistent with emphysema There are several subcentimeter pulmonary nodules bilaterally +PULM NODULES        Current Medication:  Current Outpatient Medications:    acetaminophen (TYLENOL) 500 MG tablet, Take 500 mg by mouth every 6 (six) hours as needed for moderate pain or headache., Disp: , Rfl:    albuterol (VENTOLIN HFA) 108 (90 Base) MCG/ACT inhaler, Inhale 2 puffs into the lungs every 6 (six) hours as needed for wheezing or shortness of breath., Disp: 8 g, Rfl: 2   amiodarone (PACERONE) 200 MG tablet, Take 1 tablet (200 mg total) by mouth daily., Disp: 90 tablet, Rfl: 3   aspirin 81 MG EC tablet, Take 81 mg by mouth daily., Disp: , Rfl:    atenolol (TENORMIN) 25 MG tablet, TAKE 1 TABLET BY MOUTH DAILY, Disp: 90 tablet, Rfl: 1   cholecalciferol (VITAMIN D3) 25 MCG (1000 UNIT) tablet, Take 1,000 Units by mouth daily., Disp: , Rfl:    latanoprost (XALATAN) 0.005 % ophthalmic solution, Place 1  drop into both eyes at bedtime. , Disp: , Rfl:    levothyroxine (SYNTHROID, LEVOTHROID) 75 MCG tablet, Take 75 mcg by mouth See admin instructions. Take 75 mcg daily Monday through Saturday, skip Sunday dose, Disp: , Rfl:    Multiple Vitamins-Minerals (MULTIVITAMIN WITH MINERALS) tablet, Take 1 tablet by mouth daily., Disp: , Rfl:    Tiotropium Bromide-Olodaterol (STIOLTO RESPIMAT) 2.5-2.5 MCG/ACT AERS, Inhale 2 puffs into the lungs daily.,  Disp: 4 g, Rfl: 5    ALLERGIES   Adhesive [tape] and Coumadin [warfarin]   BP 124/78 (BP Location: Right Arm, Cuff Size: Normal)   Pulse (!) 56   Temp 98.3 F (36.8 C) (Oral)   Ht 5\' 9"  (1.753 m)   Wt 184 lb 3.2 oz (83.6 kg)   SpO2 96%   BMI 27.20 kg/m       Review of Systems: Gen:  Denies  fever, sweats, chills weight loss  HEENT: Denies blurred vision, double vision, ear pain, eye pain, hearing loss, nose bleeds, sore throat Cardiac:  No dizziness, chest pain or heaviness, chest tightness,edema, No JVD Resp:   No cough, -sputum production, -shortness of breath,-wheezing, -hemoptysis,  Other:  All other systems negative   Physical Examination:   General Appearance: No distress  EYES PERRLA, EOM intact.   NECK Supple, No JVD Pulmonary: normal breath sounds, No wheezing.  CardiovascularNormal S1,S2.  No m/r/g.   Abdomen: Benign, Soft, non-tender. Neurology UE/LE 5/5 strength, no focal deficits Ext pulses intact, cap refill intact ALL OTHER ROS ARE NEGATIVE     ASSESSMENT/PLAN   71 year old pleasant white male seen today for follow up SOB and dyspnea exertion that has been worsening over the last 1 years since his back surgery in the setting of mitral valve prolapse with mild regurgitation with a history of A. fib on amiodarone therapy of 200 mg daily for the last 6 years I was asked to see patient to assess whether his shortness of breath is related to pulmonary toxicity from amiodarone however at this time with a CT of the chest that I reviewed which shows bilateral paraseptal emphysematous changes most likely related to smoking I do not see any evidence of interstitial lung disease from pulmonary toxicity   EMPHYSEMA STABLE No exacerbation at this time Continue albuterol as needed Avoid secondhand smoke Avoid SICK contacts Recommend  Masking  when appropriate Recommend Keep up-to-date with vaccinations   Abnormal CT chest Aortic aneurysm followed by CT  surgery Pulmonary nodules follow-up CT chest in 1 year Paraseptal emphysema No significant changes in last 2 years follow up CT chest in 1 year  Persistent afib  On amiodarone. Repeat PFTs pending Follow-up cardiology No evidence of lung toxicity on imaging Recommend follow-up assessment for decreased dosage of amiodarone  Abnormal CT chest with pulmonary nodules Follow-up assessment lung cancer screening program annually CT scan images reviewed in detail with patient  MEDICATION ADJUSTMENTS/LABS AND TESTS ORDERED: Continue albuterol as needed Avoid second hand smoke Follow up CT chest in 1 year  CURRENT MEDICATIONS REVIEWED AT LENGTH WITH PATIENT TODAY   Patient  satisfied with Plan of action and management. All questions answered  Follow-up in 3 months  Total time spent with patient 37 minutes     Lahari Suttles Santiago Glad, M.D.  [facility]  Medical Director [facility] Medical Director [facility] Cardio-Pulmonary Department

## 2023-01-31 ENCOUNTER — Encounter: Payer: Self-pay | Admitting: Internal Medicine

## 2023-01-31 ENCOUNTER — Ambulatory Visit: Payer: Medicare HMO | Admitting: Internal Medicine

## 2023-01-31 ENCOUNTER — Telehealth: Payer: Self-pay | Admitting: Internal Medicine

## 2023-01-31 VITALS — BP 124/78 | HR 56 | Temp 98.3°F | Ht 69.0 in | Wt 184.2 lb

## 2023-01-31 DIAGNOSIS — J449 Chronic obstructive pulmonary disease, unspecified: Secondary | ICD-10-CM

## 2023-01-31 DIAGNOSIS — R911 Solitary pulmonary nodule: Secondary | ICD-10-CM

## 2023-01-31 DIAGNOSIS — R9389 Abnormal findings on diagnostic imaging of other specified body structures: Secondary | ICD-10-CM

## 2023-01-31 NOTE — Telephone Encounter (Signed)
Spoke to patient. He wanted to make Dr. Belia Heman aware that he had a CTA 09/2022. He would like Dr. Belia Heman to review recent CT and determine if CT that was ordered today is still needed.  Dr. Belia Heman, please advise. Thanks

## 2023-01-31 NOTE — Telephone Encounter (Signed)
Had a CT scan less tan 6 mons ago and discussed with Dr. Belia Heman for him to do a Ct but wants to know if the one from 6 on be efficient enough

## 2023-01-31 NOTE — Patient Instructions (Signed)
Follow up PFT's  STOP STIOLTO  USE ALBUTEROL AS NEEDED  Avoid secondhand smoke Avoid SICK contacts Recommend  Masking  when appropriate Recommend Keep up-to-date with vaccinations  Follow up CT chest

## 2023-02-01 NOTE — Telephone Encounter (Signed)
Lm x1 for patient.  

## 2023-02-02 NOTE — Telephone Encounter (Signed)
Patient is aware of below message and voiced his understanding.  CT has been ordered.  Nothing further needed.

## 2023-02-07 ENCOUNTER — Ambulatory Visit: Payer: Medicare HMO | Admitting: Cardiology

## 2023-02-16 NOTE — Progress Notes (Signed)
Cardiology Clinic Note   Date: 02/20/2023 ID: Bobby Flowers, Bobby Flowers Oct 04, 1950, MRN [id number]  Primary Cardiologist:  Lorine Bears, MD  Patient Profile    Bobby Flowers is a 72 y.o. male who presents to the clinic today for follow up after testing.     Past medical history significant for: Chest pain/Dyspnea.  Echo 10/22/2020: EF 55 to 60%.  No RWMA.  Grade I DD.  Normal RV size/function.  Mild MR/AI. LHC 05/29/2017: Normal coronary arteries.  Very unusual horizontal orientation of the heart. Nuclear stress test 12/02/2022: Low risk study.  No evidence of significant ischemia or scar the perfusion images are suboptimal due to motion, extracardiac activity, and positioning of the heart in the thorax.  No significant coronary artery calcification. PAF. Onset 1980s. Maintained on amiodarone. Stable surveillance labs and chest x-ray October 2024. Thoracic aortic aneurysm. CTA chest aorta 10/20/2022: Grossly stable ascending thoracic aortic aneurysm 4.6 cm.  Recommend semiannual imaging. Mitral valve prolapse. Hypothyroidism.  In summary, is a long history of A-fib dating back to the 22s.  He was on Coumadin for anticoagulation at 1 point however had an allergic reaction.  He was evaluated by Dr. Johney Frame in August 2015 for possible A-fib ablation but secondary to stability on low-dose amiodarone it was felt medical therapy was best.  He had recurrent A-fib in 2016 that converted back to sinus rhythm with increased dose of amiodarone.  In April 2019 he complained of progressive substernal exertional chest tightness with associated shortness of breath.  He underwent LHC which showed normal coronary arteries and an unusual horizontal orientation of his heart.  Amiodarone dose was increased in April 2019 in the setting of increased frequency of PAF.  In July 2022 he complained of increased DOE.  There was concern for possible amiodarone induced lung toxicity.  CT chest showed changes suggestive of  emphysema with no evidence of interstitial lung disease.  It was felt COPD was related to previous tobacco use and symptoms improved with inhalers.  He was evaluated by Dr. Lalla Brothers to discuss possibility of A-fib ablation but patient elected to continue amiodarone.  Echo September 2022 showed normal LV/RV function (further details above).  He has a history of thoracic aortic aneurysm.  CTA chest aorta August 2024 showed grossly stable thoracic aortic aneurysm 4.6 cm.  He is followed by Dr. Lavinia Sharps for this.     History of Present Illness    Bobby Flowers is followed by Dr. Kirke Corin and Dr. Lalla Brothers for the above outlined history.  Patient was last seen in the office by Dr. Kirke Corin on 03/03/2022 for routine follow-up.  He was doing well at that time and no changes were made.  His last visit with EP was with Sherie Don, NP on 11/23/2022.  He reported chest pain with and without exertion and sometimes associated shortness of breath.  He also complained of some decreased exercise tolerance that he attributed to COPD.  He underwent nuclear stress testing which was a low risk study (details above).    Today, patient is doing well. Patient denies shortness of breath, dyspnea on exertion, lower extremity edema, orthopnea or PND. No chest pain, pressure, or tightness. He has cardiac awareness of arrhythmia and will have occasional palpitations. [facility]. He denies any episodes of chest pain with exercise. Discussed results of stress testing. All questions answered.       ROS: All other systems reviewed and are otherwise  negative except as noted in History of Present Illness.  Studies/Labs Reviewed    EKG Interpretation Date/Time:  Monday February 20 2023 09:05:34 EST Ventricular Rate:  53 PR Interval:  260 QRS Duration:  100 QT Interval:  436 QTC Calculation: 409 R Axis:   151  Text Interpretation: Sinus bradycardia with 1st degree A-V block Anterolateral  infarct (cited on or before 20-Feb-2023) When compared with ECG of 23-Nov-2022 09:36, No significant change was found Confirmed by Carlos Levering [phone]) on 02/20/2023 9:11:31 AM   11/21/2022: BUN 19; Creatinine, Ser 0.98; Potassium 4.3; Sodium 141    12/01/2022: Hemoglobin 15.6    11/23/2022: TSH 3.660   Risk Assessment/Calculations    CHA2DS2-VASc Score = 1   This indicates a 0.6% annual risk of stroke. The patient's score is based upon: CHF History: 0 HTN History: 0 Diabetes History: 0 Stroke History: 0 Vascular Disease History: 0 Age Score: 1 Gender Score: 0  Physical Exam    VS:  BP 100/64 (BP Location: Left Arm, Patient Position: Sitting, Cuff Size: Normal)   Pulse (!) 53   Ht 5\' 9"  (1.753 m)   Wt 182 lb 8 oz (82.8 kg)   SpO2 98%   BMI 26.95 kg/m  , BMI Body mass index is 26.95 kg/m.  GEN: Well nourished, well developed, in no acute distress. Neck: No JVD or carotid bruits. Cardiac:  RRR. No murmurs. No rubs or gallops.   Respiratory:  Respirations regular and unlabored. Clear to auscultation without rales, wheezing or rhonchi. GI: Soft, nontender, nondistended. Extremities: Radials/DP/PT 2+ and equal bilaterally. No clubbing or cyanosis. No edema.  Skin: Warm and dry, no rash. Neuro: Strength intact.  Assessment & Plan   Chest pain Low risk nuclear stress test October 2024.  Patient denies further episodes of chest pain. [facility]   PAF Onset 1980s.  Patient reports cardiac awareness of arrhythmia and will have occasional palpitations. EKG shows sinus bradycardia with 1st degree AV block. RRR on exam today. CHA2DS2-VASc 1.  -Continue amiodarone, atenolol, aspirin.  Thoracic aortic aneurysm CTA chest aorta August 2024 showed grossly stable ascending thoracic aortic aneurysm 4.6 cm.  Patient denies chest pain, back pain, abdominal pain, shortness of breath, presyncope, syncope. -Continue to follow with CT  surgery.  Disposition: Return in 6 months or sooner as needed.          Signed, Etta Grandchild. Samyria Rudie, DNP, NP-C

## 2023-02-20 ENCOUNTER — Ambulatory Visit: Payer: Medicare HMO | Attending: Student | Admitting: Student

## 2023-02-20 ENCOUNTER — Encounter: Payer: Self-pay | Admitting: Student

## 2023-02-20 VITALS — BP 100/64 | HR 53 | Ht 69.0 in | Wt 182.5 lb

## 2023-02-20 DIAGNOSIS — I480 Paroxysmal atrial fibrillation: Secondary | ICD-10-CM | POA: Diagnosis not present

## 2023-02-20 DIAGNOSIS — R079 Chest pain, unspecified: Secondary | ICD-10-CM | POA: Diagnosis not present

## 2023-02-20 DIAGNOSIS — I7121 Aneurysm of the ascending aorta, without rupture: Secondary | ICD-10-CM

## 2023-02-20 NOTE — Patient Instructions (Signed)
Medication Instructions:  Your Physician recommend you continue on your current medication as directed.    *If you need a refill on your cardiac medications before your next appointment, please call your pharmacy*   Lab Work: None ordered at this time    Follow-Up: At [facility], you and your health needs are our priority.  As part of our continuing mission to provide you with exceptional heart care, we have created designated Provider Care Teams.  These Care Teams include your primary Cardiologist (physician) and Advanced Practice Providers (APPs -  Physician Assistants and Nurse Practitioners) who all work together to provide you with the care you need, when you need it.  We recommend signing up for the patient portal called "MyChart".  Sign up information is provided on this After Visit Summary.  MyChart is used to connect with patients for Virtual Visits (Telemedicine).  Patients are able to view lab/test results, encounter notes, upcoming appointments, etc.  Non-urgent messages can be sent to your provider as well.   To learn more about what you can do with MyChart, go to [email or web address].    Your next appointment:   6 month(s)  Provider:   You may see Lorine Bears, MD or one of the following Advanced Practice Providers on your designated Care Team:   Nicolasa Ducking, NP Eula Listen, PA-C Cadence Fransico Michael, PA-C Charlsie Quest, NP Carlos Levering, NP

## 2023-03-02 ENCOUNTER — Other Ambulatory Visit: Payer: Self-pay | Admitting: Surgery

## 2023-03-02 DIAGNOSIS — I7121 Aneurysm of the ascending aorta, without rupture: Secondary | ICD-10-CM

## 2023-03-24 ENCOUNTER — Other Ambulatory Visit: Payer: Self-pay | Admitting: Cardiovascular Disease

## 2023-04-05 ENCOUNTER — Other Ambulatory Visit: Payer: Self-pay | Admitting: Cardiovascular Disease

## 2023-04-05 NOTE — Progress Notes (Signed)
 [address]       [place] [address]             [phone]        Bobby Flowers [id number] 08-Jun-1950  History of Present Illness: Bobby Flowers is a 73 year old male with a past medical history of COPD, paroxysmal atrial fibrillation, chronic lower extremity pain, hypothyroidism, mitral valve prolapse, and an ascending thoracic aortic aneurysm. He was incidentally found to have a thoracic aortic aneurysm on echocardiogram in 2019 that measured 4.4cm. He has been seen by our clinic since 2020, when his aneurysm measured 4.5cm by CTA and has remained stable over time.   Today the patient denies chest pain, chest tightness, dizziness and LOC. He does admit to shortness of breath with exertion likely related to COPD but states this has remained stable. He also admits to bilateral leg swelling at times.   Current Outpatient Medications on File Prior to Visit  Medication Sig Dispense Refill   acetaminophen (TYLENOL) 500 MG tablet Take 500 mg by mouth every 6 (six) hours as needed for moderate pain or headache.     albuterol (VENTOLIN HFA) 108 (90 Base) MCG/ACT inhaler Inhale 2 puffs into the lungs every 6 (six) hours as needed for wheezing or shortness of breath. 8 g 2   amiodarone (PACERONE) 200 MG tablet TAKE 1 TABLET BY MOUTH DAILY 90 tablet 3   aspirin 81 MG EC tablet Take 81 mg by mouth daily.     atenolol (TENORMIN) 25 MG tablet TAKE 1 TABLET BY MOUTH DAILY 90 tablet 1   cholecalciferol (VITAMIN D3) 25 MCG (1000 UNIT) tablet Take 1,000 Units by mouth daily.     latanoprost (XALATAN) 0.005 % ophthalmic solution Place 1 drop into both eyes at bedtime.      levothyroxine (SYNTHROID, LEVOTHROID) 75 MCG tablet Take 75 mcg by mouth See admin instructions. Take 75 mcg daily Monday through Saturday, skip Sunday dose     Multiple Vitamins-Minerals (MULTIVITAMIN WITH MINERALS) tablet Take 1 tablet by mouth daily.     Tiotropium Bromide-Olodaterol (STIOLTO RESPIMAT) 2.5-2.5  MCG/ACT AERS Inhale 2 puffs into the lungs daily. (Patient not taking: Reported on 02/20/2023) 4 g 5   No current facility-administered medications on file prior to visit.   Vitals: Today's Vitals   04/18/23 1422  BP: 121/74  Pulse: 60  Resp: 20  SpO2: 96%  Weight: 184 lb 6.4 oz (83.6 kg)   Body mass index is 27.23 kg/m.  Physical Exam General: Alert and oriented, no acute distress Neuro: Grossly intact CV: Regular rate and rhythm, no murmur Pulm: Clear to auscultation bilaterally GI: Nontender, no distension Extremities: 2+ radial pulses bilaterally, trace bilateral lower extremity edema  CTA Results: CLINICAL DATA:  Aortic aneurysm follow-up.   EXAM: CT ANGIOGRAPHY CHEST WITH CONTRAST   TECHNIQUE: Multidetector CT imaging of the chest was performed using the standard protocol during bolus administration of intravenous contrast. Multiplanar CT image reconstructions and MIPs were obtained to evaluate the vascular anatomy.   RADIATION DOSE REDUCTION: This exam was performed according to the departmental dose-optimization program which includes automated exposure control, adjustment of the mA and/or kV according to patient size and/or use of iterative reconstruction technique.   CONTRAST:  75mL ISOVUE-370 IOPAMIDOL (ISOVUE-370) INJECTION 76%   COMPARISON:  Chest CT dated 10/20/2022.   FINDINGS: Cardiovascular: Mild cardiomegaly. No pericardial effusion. Similar appearance of a dilated ascending aorta measuring up to 4.5 cm in diameter. No  dissection. Mild atherosclerotic calcification of the aortic arch. The origins of the great vessels of the aortic arch and the central pulmonary arteries appear patent.   Mediastinum/Nodes: No hilar or mediastinal adenopathy. The esophagus is grossly unremarkable. No mediastinal fluid collection.   Lungs/Pleura: Paraseptal emphysema. No focal consolidation, pleural effusion, or pneumothorax. A 3 mm left upper lobe subpleural  nodule or scarring, stable since 2020. No imaging follow-up. The central airways are patent.   Upper Abdomen: No acute findings.   Musculoskeletal: No acute osseous pathology.   Review of the MIP images confirms the above findings.   IMPRESSION: 1. Similar appearance of a dilated ascending aorta measuring up to 4.5 cm in diameter. Ascending thoracic aortic aneurysm. Recommend semi-annual imaging followup by CTA or MRA and referral to cardiothoracic surgery if not already obtained. This recommendation follows 2010 ACCF/AHA/AATS/ACR/ASA/SCA/SCAI/SIR/STS/SVM Guidelines for the Diagnosis and Management of Patients With Thoracic Aortic Disease. Circulation. 2010; 121: V409-W119. Aortic aneurysm NOS (ICD10-I71.9) 2. No acute intrathoracic pathology. 3. Aortic Atherosclerosis (ICD10-I70.0) and Emphysema (ICD10-J43.9).     Electronically Signed   By: Elgie Collard M.D.   On: 04/18/2023 14:38   Impression and Plan: Thoracic aortic aneurysm: Bobby Flowers presents to the clinic with a stable 4.5cm ascending aortic aneurysm.  Echocardiogram in 2022 shows a trileaflet aortic valve with evidence of mild regurgitation. We discussed the natural history and and risk factors for growth of ascending aortic aneurysms.  We covered the importance of tight blood pressure control, refraining from lifting heavy objects, and avoiding fluoroquinolones. He reports he quit smoking in 2002 after smoking for over 30 years. The patient is aware of signs and symptoms of aortic dissection and when to present to the emergency department. He does admit to lower extremity swelling at times, patient will continue to follow up with PCP. Patient to elevate legs when seated and use compression stockings if needed.  We will continue surveillance. Plan to have the patient return to the clinic in 6 months with chest CTA.  Aortic atherosclerosis: Followed by a cardiologist  Emphysema and lung nodule: 3mm left upper lobe lung  nodule has remained stable since 2020, no further imaging is recommended. Emphysema was noted, patient is followed by a pulmonologist for COPD.   Risk Modification:  Statin:  No hyperlipidemia per patient, will defer to PCP  Smoking cessation instruction/counseling given: Quit smoking in 2002  Patient was counseled on importance of Blood Pressure Control.  Despite Medical intervention if the patient notices persistently elevated blood pressure readings.  They are instructed to contact their Primary Care Physician  Please avoid use of Fluoroquinolones as this can potentially increase your risk of Aortic Rupture and/or Dissection  Patient educated on signs and symptoms of Aortic Dissection, handout also provided in AVS  Jenny Reichmann, PA-C 04/05/23

## 2023-04-18 ENCOUNTER — Ambulatory Visit: Payer: Medicare HMO | Admitting: Physician Assistant

## 2023-04-18 ENCOUNTER — Ambulatory Visit
Admission: RE | Admit: 2023-04-18 | Discharge: 2023-04-18 | Disposition: A | Discharge status: Home / Self Care | Payer: Medicare HMO | Source: Ambulatory Visit | Attending: Surgery | Admitting: Surgery

## 2023-04-18 VITALS — BP 121/74 | HR 60 | Resp 20 | Wt 184.4 lb

## 2023-04-18 DIAGNOSIS — I7121 Aneurysm of the ascending aorta, without rupture: Secondary | ICD-10-CM

## 2023-04-18 MED ORDER — IOPAMIDOL (ISOVUE-370) INJECTION 76%
75.0000 mL | Freq: Once | INTRAVENOUS | Status: AC | PRN
  Administered 2023-04-18: 75 mL via INTRAVENOUS

## 2023-04-18 NOTE — Patient Instructions (Signed)

## 2023-04-26 ENCOUNTER — Ambulatory Visit: Payer: Medicare HMO | Attending: Internal Medicine

## 2023-04-26 DIAGNOSIS — J449 Chronic obstructive pulmonary disease, unspecified: Secondary | ICD-10-CM | POA: Diagnosis present

## 2023-04-26 LAB — PULMONARY FUNCTION TEST ARMC ONLY
DL/VA % pred: 93 %
DL/VA: 3.79 ml/min/mmHg/L
DLCO unc % pred: 84 %
DLCO unc: 21.03 ml/min/mmHg
FEF 25-75 Post: 3.15 L/s
FEF 25-75 Pre: 2.5 L/s
FEF2575-%Change-Post: 25 %
FEF2575-%Pred-Post: 135 %
FEF2575-%Pred-Pre: 107 %
FEV1-%Change-Post: 5 %
FEV1-%Pred-Post: 85 %
FEV1-%Pred-Pre: 81 %
FEV1-Post: 2.65 L
FEV1-Pre: 2.51 L
FEV1FVC-%Change-Post: 3 %
FEV1FVC-%Pred-Pre: 109 %
FEV6-%Change-Post: 2 %
FEV6-%Pred-Post: 80 %
FEV6-%Pred-Pre: 78 %
FEV6-Post: 3.19 L
FEV6-Pre: 3.13 L
FEV6FVC-%Change-Post: 0 %
FEV6FVC-%Pred-Post: 106 %
FEV6FVC-%Pred-Pre: 106 %
FVC-%Change-Post: 2 %
FVC-%Pred-Post: 75 %
FVC-%Pred-Pre: 74 %
FVC-Post: 3.19 L
FVC-Pre: 3.13 L
Post FEV1/FVC ratio: 83 %
Post FEV6/FVC ratio: 100 %
Pre FEV1/FVC ratio: 80 %
Pre FEV6/FVC Ratio: 100 %
RV % pred: 134 %
RV: 3.25 L
TLC % pred: 98 %
TLC: 6.7 L

## 2023-04-26 MED ORDER — ALBUTEROL SULFATE (2.5 MG/3ML) 0.083% IN NEBU
2.5000 mg | INHALATION_SOLUTION | Freq: Once | RESPIRATORY_TRACT | Status: AC
  Administered 2023-04-26: 2.5 mg via RESPIRATORY_TRACT
  Filled 2023-04-26: qty 3

## 2023-05-02 ENCOUNTER — Ambulatory Visit: Payer: Medicare HMO | Admitting: Internal Medicine

## 2023-05-02 ENCOUNTER — Encounter: Payer: Self-pay | Admitting: Internal Medicine

## 2023-05-02 VITALS — BP 106/66 | HR 51 | Temp 98.0°F | Ht 69.0 in | Wt 183.0 lb

## 2023-05-02 DIAGNOSIS — J438 Other emphysema: Secondary | ICD-10-CM

## 2023-05-02 DIAGNOSIS — R918 Other nonspecific abnormal finding of lung field: Secondary | ICD-10-CM

## 2023-05-02 DIAGNOSIS — Z79899 Other long term (current) drug therapy: Secondary | ICD-10-CM

## 2023-05-02 DIAGNOSIS — I4819 Other persistent atrial fibrillation: Secondary | ICD-10-CM | POA: Diagnosis not present

## 2023-05-02 NOTE — Progress Notes (Signed)
 [facility] Pulmonary Medicine Consultation      Date: 05/02/2023,   MRN# [id number] Bobby Flowers 11-10-1950    SYNOPSIS  73 y.o. male who presents for complaints of SOB H/o persistent atrial fibrillation  and mitral valve prolapse with mild regurgitation.  He has prolonged history of atrial fibrillation which was diagnosed in the 32s.  He was briefly on warfarin but had an allergic reaction with a rash. He has not been on any anticoagulant since then given his low CHADS2 VASc score.  He quit smoking in 2002 and does not consume alcohol. In 2015, patient did not undergo ablation therapy and was given low dose AMIODARONE He had recurrent atrial fibrillation in 2016 but he converted back to sinus rhythm after increasing the dose of amiodarone.  He had left heart catheterization in 2019 which showed normal coronary arteries with very unusual horizontal orientation of the heart. Echocardiogram 2019 showed normal LV systolic function with moderately dilated ascending aorta at 4.4 cm. His atrial fibrillation has been well controlled with amiodarone 200 mg once daily. He had back surgery twice last year and feels that he became deconditioned after that.   Since then he has been having DOE and SOB No previous DX of COPD or PNEUMONIA  PFT's 10/2020 NL RATIO FEV1 87% predicted FVC 79% predicted  Pulmonary function test March 2025 Findings reviewed in  detail with patient FVC 75% predicted FEV1 80% predicted FEV1 FVC ratio within normal limit 83% predicted No significant bronchodilator response  Total lung capacity within normal limits No significant abnormality noted DLCO Flow volume loops no significant findings Findings suggest normal pulmonary function testing     CHIEF COMPLAINT:  Follow up assessment of SOB Assessment of AMIO therapy Follow up abnormal CT chest   HPI COPD seems stable at this time Patient on albuterol as needed Uses mostly prior to singing No exacerbation  at this time No evidence of heart failure at this time No evidence or signs of infection at this time No respiratory distress No fevers, chills, nausea, vomiting, diarrhea No evidence of lower extremity edema No evidence hemoptysis  Abnormal CT chest with pulmonary nodules Descending aortic aneurysm which is currently 4.5 He gets CT scans of his chest every 6 months Regarding his pulmonary nodule has been stable since 2020 over the last 5 years Emphysematous changes stable, lung nodule stable     CT chest reviewed with patient in detail  CT of the chest from August 2022 reviewed with patient Bilateral paraseptal cystic changes consistent with emphysema There are several subcentimeter pulmonary nodules bilaterally       CT of the chest 10/2021 Independent review today 10/26/2021 Bilateral paraseptal cystic changes consistent with emphysema There are several subcentimeter pulmonary nodules bilaterally +PULM NODULES       CT chest February 2025 Independently reviewed today in detail with the patient bilateral paraseptal cystic changes consistent with emphysema There are several subcentimeter pulmonary nodules bilaterally +PULM NODULES        Current Medication:  Current Outpatient Medications:    acetaminophen (TYLENOL) 500 MG tablet, Take 500 mg by mouth every 6 (six) hours as needed for moderate pain or headache., Disp: , Rfl:    albuterol (VENTOLIN HFA) 108 (90 Base) MCG/ACT inhaler, Inhale 2 puffs into the lungs every 6 (six) hours as needed for wheezing or shortness of breath., Disp: 8 g, Rfl: 2   amiodarone (PACERONE) 200 MG tablet, TAKE 1 TABLET BY MOUTH DAILY, Disp: 90 tablet, Rfl: 3  aspirin 81 MG EC tablet, Take 81 mg by mouth daily., Disp: , Rfl:    atenolol (TENORMIN) 25 MG tablet, TAKE 1 TABLET BY MOUTH DAILY, Disp: 90 tablet, Rfl: 0   cholecalciferol (VITAMIN D3) 25 MCG (1000 UNIT) tablet, Take 1,000 Units by mouth daily., Disp: , Rfl:    latanoprost  (XALATAN) 0.005 % ophthalmic solution, Place 1 drop into both eyes at bedtime. , Disp: , Rfl:    levothyroxine (SYNTHROID, LEVOTHROID) 75 MCG tablet, Take 75 mcg by mouth See admin instructions. Take 75 mcg daily Monday through Saturday, skip Sunday dose, Disp: , Rfl:    Multiple Vitamins-Minerals (MULTIVITAMIN WITH MINERALS) tablet, Take 1 tablet by mouth daily., Disp: , Rfl:    Tiotropium Bromide-Olodaterol (STIOLTO RESPIMAT) 2.5-2.5 MCG/ACT AERS, Inhale 2 puffs into the lungs daily., Disp: 4 g, Rfl: 5    ALLERGIES   Adhesive [tape] and Coumadin [warfarin]   BP 106/66 (BP Location: Left Arm, Patient Position: Sitting, Cuff Size: Normal)   Pulse (!) 51   Temp 98 F (36.7 C) (Temporal)   Ht 5\' 9"  (1.753 m)   Wt 183 lb (83 kg)   SpO2 98%   BMI 27.02 kg/m     Review of Systems: Gen:  Denies  fever, sweats, chills weight loss  HEENT: Denies blurred vision, double vision, ear pain, eye pain, hearing loss, nose bleeds, sore throat Cardiac:  No dizziness, chest pain or heaviness, chest tightness,edema, No JVD Resp:   No cough, -sputum production, -shortness of breath,-wheezing, -hemoptysis,  Other:  All other systems negative   Physical Examination:   General Appearance: No distress  EYES PERRLA, EOM intact.   NECK Supple, No JVD Pulmonary: normal breath sounds, No wheezing.  CardiovascularNormal S1,S2.  No m/r/g.   Abdomen: Benign, Soft, non-tender. Neurology UE/LE 5/5 strength, no focal deficits Ext pulses intact, cap refill intact ALL OTHER ROS ARE NEGATIVE     ASSESSMENT/PLAN   73 year old pleasant white male seen today for follow-up assessment for shortness of breath and dyspnea on exertion that has been steady over the past 1 year with a history of mitral valve prolapse with mild regurgitation with a history of A-fib on amiodarone therapy at 200 mg daily for the last 6 years   Patient is not showing any symptoms of pulmonary toxicity from amiodarone use CT of the  chest shows bilateral paraseptal emphysematous changes most likely related to previous smoking history, no evidence of interstitial lung disease from pulmonary toxicity from amiodarone  Assessment of emphysema Stable no symptoms No exacerbation at this time No indication for maintenance therapy Avoid Allergens and Irritants Avoid secondhand smoke Avoid SICK contacts Recommend  Masking  when appropriate Recommend Keep up-to-date with vaccinations Continue to use albuterol as needed  Abnormal CT chest Aortic aneurysm followed by CT surgery every 6 months Pulmonary nodules stable over the last 5 years Paraseptal emphysema stable over the last 5 years Follow-up CT chest every 6 months per vascular surgery CT scan images reviewed in detail with patient  Persistent afib  On amiodarone. Repeat PFTs did not show any significant abnormality FVC 75% predicted Follow-up cardiology No evidence of lung toxicity on imaging Continue amiodarone as prescribed     MEDICATION ADJUSTMENTS/LABS AND TESTS ORDERED: Continue albuterol as needed Follow up CT chest in 6 months Avoid Allergens and Irritants Avoid secondhand smoke Avoid SICK contacts Recommend  Masking  when appropriate Recommend Keep up-to-date with vaccinations   CURRENT MEDICATIONS REVIEWED AT LENGTH WITH PATIENT TODAY  Patient  satisfied with Plan of action and management. All questions answered  Follow-up in  1 year Total time spent with patient 42  minutes     Raynette Arras Santiago Glad, M.D.  [facility]  Medical Director [facility] Medical Director [facility] Cardio-Pulmonary Department

## 2023-05-02 NOTE — Patient Instructions (Addendum)
 Continue albuterol as needed Avoid second hand smoke Follow up CT chest per vascular surgery  Avoid Allergens and Irritants Avoid secondhand smoke Avoid SICK contacts Recommend  Masking  when appropriate Recommend Keep up-to-date with vaccinations

## 2023-05-24 ENCOUNTER — Ambulatory Visit
Admission: RE | Admit: 2023-05-24 | Discharge: 2023-05-24 | Disposition: A | Discharge status: Home / Self Care | Source: Ambulatory Visit | Attending: Urology | Admitting: Urology

## 2023-05-24 ENCOUNTER — Encounter: Payer: Self-pay | Admitting: Urology

## 2023-05-24 ENCOUNTER — Ambulatory Visit: Payer: Self-pay | Admitting: Urology

## 2023-05-24 ENCOUNTER — Ambulatory Visit
Admission: RE | Admit: 2023-05-24 | Discharge: 2023-05-24 | Disposition: A | Discharge status: Home / Self Care | Attending: Urology | Admitting: Urology

## 2023-05-24 VITALS — BP 124/79 | HR 54 | Ht 69.0 in | Wt 182.0 lb

## 2023-05-24 DIAGNOSIS — N200 Calculus of kidney: Secondary | ICD-10-CM

## 2023-05-24 NOTE — Progress Notes (Signed)
 I, Maysun Anabel Bene, acting as a scribe for Riki Altes, MD., have documented all relevant documentation on the behalf of Riki Altes, MD, as directed by Riki Altes, MD while in the presence of Riki Altes, MD.  05/24/2023 6:49 PM   Maryln Manuel 01-Jun-1950 [id number]  Referring provider: Jerl Mina, MD [address] [facility] [place],  [place] [address]  Chief Complaint  Patient presents with   Nephrolithiasis   Urologic history: 1.  Nephrolithiasis Bilateral ureteroscopic stone removal by Dr. Alvester Morin 07/2017 Small, nonobstructing left lower pole calculi CT abdomen/pelvis with contrast was performed which showed moderate left hydronephrosis/hydroureter secondary to an 8 mm lower proximal ureteral calculus.  There were bilateral 2 mm lower pole nonobstructing calculi Metabolic evaluation: stone analysis 100% uric acid; low urine volume 930 mL, and low urine pH 5.2. Declined potassium citrate. Litholyte was discussed.  HPI: Bobby Flowers is a 73 y.o. male presents for annual follow-up.  No problem since last year's visit Denies flank, abdominal, or pelvic pain.  Denies gross hematuria.    PMH: Past Medical History:  Diagnosis Date   Chest tightness    a. 05/2017 Cath: nl cors.   Chronic pain of left lower extremity    right   COPD (chronic obstructive pulmonary disease) (HCC)    DDD (degenerative disc disease), lumbosacral    Dilated aortic root (HCC)    a. 06/2017 Echo: Ao root 4.4cm.   Dysrhythmia    afib   Headache    migraines in past. none for 6-8 yrs.   History of kidney stones    Hypothyroidism    Mitral valve prolapse    a. 09/2015 Echo: EF 55-60%, no rwma, nl LA size, nl RV fxn, nl PASP; b. 06/2017 Echo: EF 55-60%, no rwma, Gr2 DD. Asc Ao 4.4cm. Mild MR (? mild prolapse). Mildly dil LA.   PAF (paroxysmal atrial fibrillation) (HCC)    a. Dx in 1980's; b. CHA2DS2VASc = 1 - briefly on coumadin many years ago-->caused rash; c. Chronic Amio  200mg  QD.   Wears contact lenses     Surgical History: Past Surgical History:  Procedure Laterality Date   CARDIAC CATHETERIZATION     COLONOSCOPY  04/25/2005   COLONOSCOPY WITH PROPOFOL N/A 07/22/2015   Procedure: COLONOSCOPY WITH PROPOFOL;  Surgeon: Earline Mayotte, MD;  Location: [facility];  Service: Endoscopy;  Laterality: N/A;   COLONOSCOPY WITH PROPOFOL N/A 09/04/2020   Procedure: COLONOSCOPY WITH PROPOFOL;  Surgeon: Earline Mayotte, MD;  Location: [facility];  Service: Endoscopy;  Laterality: N/A;   CYSTOSCOPY WITH HOLMIUM LASER LITHOTRIPSY Bilateral 07/28/2017   Procedure: CYSTOSCOPY WITH HOLMIUM LASER LITHOTRIPSY;  Surgeon: Crista Elliot, MD;  Location: [facility];  Service: Urology;  Laterality: Bilateral;   CYSTOSCOPY WITH STENT PLACEMENT Bilateral 07/28/2017   Procedure: CYSTOSCOPY WITH STENT PLACEMENT;  Surgeon: Crista Elliot, MD;  Location: [facility];  Service: Urology;  Laterality: Bilateral;   CYSTOSCOPY WITH URETEROSCOPY Bilateral 07/28/2017   Procedure: CYSTOSCOPY WITH URETEROSCOPY;  Surgeon: Crista Elliot, MD;  Location: [facility];  Service: Urology;  Laterality: Bilateral;   CYSTOSCOPY/URETEROSCOPY/HOLMIUM LASER/STENT PLACEMENT Left 11/09/2021   Procedure: CYSTOSCOPY/URETEROSCOPY/HOLMIUM LASER/STENT PLACEMENT;  Surgeon: Riki Altes, MD;  Location: [facility];  Service: Urology;  Laterality: Left;   EYE SURGERY Left    cataract surgery   HEMORROIDECTOMY     HERNIA REPAIR Left 05/22/2015   Left inguinal hernia repair with medium  Ultra Pro mesh   INGUINAL HERNIA REPAIR Left 07/22/2015   Procedure: HERNIA REPAIR INGUINAL ADULT;  Surgeon: Earline Mayotte, MD;  Location: [facility];  Service: General;  Laterality: Left;   LEFT HEART CATH AND CORONARY ANGIOGRAPHY Left 05/29/2017   Procedure: LEFT HEART CATH AND CORONARY ANGIOGRAPHY;  Surgeon: Iran Ouch, MD;  Location: [facility];  Service: Cardiovascular;  Laterality: Left;    LUMBAR FUSION N/A    LUMBAR LAMINECTOMY/DECOMPRESSION MICRODISCECTOMY Right 07/01/2019   Procedure: OPEN L4 LAMINECTOMY, L3/4 DISCECTOMY ON RIGHT, L4/5 DISCECTOMY;  Surgeon: Lucy Chris, MD;  Location: [facility];  Service: Neurosurgery;  Laterality: Right;   myringotomy     with tubes x 5.  tubes fall out frequently   MYRINGOTOMY WITH TUBE PLACEMENT Left 12/13/2018   Procedure: MYRINGOTOMY WITH T-TUBE PLACEMENT;  Surgeon: Bud Face, MD;  Location: [facility];  Service: ENT;  Laterality: Left;  needs to stay first case vaught / juengel doing case together   NASAL SINUS SURGERY     NASOPHARYNGOSCOPY EUSTATION TUBE BALLOON DILATION Left 12/13/2018   Procedure: NASOPHARYNGOSCOPY EUSTATION TUBE BALLOON DILATION WITH OUTFRACTURE OF TURBINATES;  Surgeon: Bud Face, MD;  Location: [facility];  Service: ENT;  Laterality: Left;   SPINE SURGERY N/A    TOTAL HIP ARTHROPLASTY Right 03/26/2021   Procedure: TOTAL HIP ARTHROPLASTY ANTERIOR APPROACH;  Surgeon: Kennedy Bucker, MD;  Location: [facility];  Service: Orthopedics;  Laterality: Right;   VASECTOMY      Home Medications:  Allergies as of 05/24/2023       Reactions   Adhesive [tape] Rash   Paper tape okay   Coumadin [warfarin] Rash        Medication List        Accurate as of May 24, 2023  6:49 PM. If you have any questions, ask your nurse or doctor.          STOP taking these medications    Stiolto Respimat 2.5-2.5 MCG/ACT Aers Generic drug: Tiotropium Bromide-Olodaterol Stopped by: Riki Altes       TAKE these medications    acetaminophen 500 MG tablet Commonly known as: TYLENOL Take 500 mg by mouth every 6 (six) hours as needed for moderate pain or headache.   albuterol 108 (90 Base) MCG/ACT inhaler Commonly known as: VENTOLIN HFA Inhale 2 puffs into the lungs every 6 (six) hours as needed for wheezing or shortness of breath.   amiodarone 200 MG tablet Commonly known as:  PACERONE TAKE 1 TABLET BY MOUTH DAILY   aspirin EC 81 MG tablet Take 81 mg by mouth daily.   atenolol 25 MG tablet Commonly known as: TENORMIN TAKE 1 TABLET BY MOUTH DAILY   cholecalciferol 25 MCG (1000 UNIT) tablet Commonly known as: VITAMIN D3 Take 1,000 Units by mouth daily.   latanoprost 0.005 % ophthalmic solution Commonly known as: XALATAN Place 1 drop into both eyes at bedtime.   levothyroxine 75 MCG tablet Commonly known as: SYNTHROID Take 75 mcg by mouth See admin instructions. Take 75 mcg daily Monday through Saturday, skip Sunday dose   multivitamin with minerals tablet Take 1 tablet by mouth daily.        Allergies:  Allergies  Allergen Reactions   Adhesive [Tape] Rash    Paper tape okay   Coumadin [Warfarin] Rash    Family History: Family History  Problem Relation Age of Onset   Heart failure Mother    Leukemia Father    Alcoholism Brother  Social History:  reports that he quit smoking about 22 years ago. His smoking use included cigarettes. He started smoking about 53 years ago. He has a 46.5 pack-year smoking history. He has never used smokeless tobacco. He reports that he does not drink alcohol and does not use drugs.   Physical Exam: BP 124/79   Pulse (!) 54   Ht 5\' 9"  (1.753 m)   Wt 182 lb (82.6 kg)   BMI 26.88 kg/m   Constitutional:  Alert and oriented, No acute distress. HEENT: [place] AT Respiratory: Normal respiratory effort, no increased work of breathing Psychiatric: Normal mood and affect.   Pertinent Imaging: KUB performed earlier this morning was personally reviewed and interpreted. There is right hip replacement present and spinal hardware. No calcification suspicious for urinary tract stones are identified.    Assessment & Plan:    1. Recurrent nephrolithiasis Presently asymptomatic 1 year follow-up with renal ultrasound.  Instructed to call earlier for development of flank pain or renal colic.  I have reviewed the  above documentation for accuracy and completeness, and I agree with the above.   Riki Altes, MD  [facility] [address] [place], [place] [address] [phone]

## 2023-06-22 ENCOUNTER — Other Ambulatory Visit: Payer: Self-pay | Admitting: Primary Care

## 2023-06-22 NOTE — Telephone Encounter (Signed)
 Last Fill: 02/26/21  Last OV: 05/02/23 Next OV: None Scheduled  Routing to provider for review/authorization.

## 2023-06-22 NOTE — Telephone Encounter (Signed)
 Copied from CRM [id number]. Topic: Clinical - Medication Refill >> Jun 22, 2023 11:43 AM Isabell A wrote: Most Recent Primary Care Visit:   Medication: albuterol  (VENTOLIN  HFA) 108 (90 Base) MCG/ACT inhaler  Has the patient contacted their pharmacy? No (Agent: If no, request that the patient contact the pharmacy for the refill. If patient does not wish to contact the pharmacy document the reason why and proceed with request.) (Agent: If yes, when and what did the pharmacy advise?)  Is this the correct pharmacy for this prescription? Yes If no, delete pharmacy and type the correct one.  This is the patient's preferred pharmacy:  [facility] [id number] [place], [place] - [address] [place] [address] Phone: [phone] Fax: [phone]   Has the prescription been filled recently? No  Is the patient out of the medication? No  Has the patient been seen for an appointment in the last year OR does the patient have an upcoming appointment? Yes  Can we respond through MyChart? No  Agent: Please be advised that Rx refills may take up to 3 business days. We ask that you follow-up with your pharmacy.

## 2023-06-23 ENCOUNTER — Other Ambulatory Visit (HOSPITAL_BASED_OUTPATIENT_CLINIC_OR_DEPARTMENT_OTHER): Payer: Self-pay

## 2023-06-23 MED ORDER — ALBUTEROL SULFATE HFA 108 (90 BASE) MCG/ACT IN AERS: 2.0000 | INHALATION_SPRAY | Freq: Four times a day (QID) | RESPIRATORY_TRACT | 2 refills | Status: AC | PRN

## 2023-07-05 ENCOUNTER — Other Ambulatory Visit: Payer: Self-pay | Admitting: Cardiovascular Disease

## 2023-08-09 ENCOUNTER — Ambulatory Visit: Attending: Nurse Practitioner | Admitting: Nurse Practitioner

## 2023-08-09 ENCOUNTER — Encounter: Payer: Self-pay | Admitting: Nurse Practitioner

## 2023-08-09 VITALS — BP 120/70 | HR 51 | Ht 69.0 in | Wt 182.8 lb

## 2023-08-09 DIAGNOSIS — R001 Bradycardia, unspecified: Secondary | ICD-10-CM

## 2023-08-09 DIAGNOSIS — R5383 Other fatigue: Secondary | ICD-10-CM | POA: Diagnosis not present

## 2023-08-09 DIAGNOSIS — I341 Nonrheumatic mitral (valve) prolapse: Secondary | ICD-10-CM

## 2023-08-09 DIAGNOSIS — E875 Hyperkalemia: Secondary | ICD-10-CM | POA: Diagnosis not present

## 2023-08-09 DIAGNOSIS — I7121 Aneurysm of the ascending aorta, without rupture: Secondary | ICD-10-CM

## 2023-08-09 DIAGNOSIS — I480 Paroxysmal atrial fibrillation: Secondary | ICD-10-CM | POA: Diagnosis not present

## 2023-08-09 DIAGNOSIS — R0609 Other forms of dyspnea: Secondary | ICD-10-CM

## 2023-08-09 DIAGNOSIS — E039 Hypothyroidism, unspecified: Secondary | ICD-10-CM

## 2023-08-09 MED ORDER — ATENOLOL 25 MG PO TABS: 12.5000 mg | ORAL_TABLET | Freq: Every day | ORAL | 3 refills | Status: AC

## 2023-08-09 NOTE — Patient Instructions (Signed)
 Medication Instructions:  Your physician recommends the following medication changes.  DECREASE: Atenolol  to 12.5 mg (1/2) tablet daily  *If you need a refill on your cardiac medications before your next appointment, please call your pharmacy*  Lab Work: Your provider would like for you to have following labs drawn today Potassium.   If you have labs (blood work) drawn today and your tests are completely normal, you will receive your results only by: MyChart Message (if you have MyChart) OR A paper copy in the mail If you have any lab test that is abnormal or we need to change your treatment, we will call you to review the results.  Testing/Procedures: Your physician has requested that you have an echocardiogram. Echocardiography is a painless test that uses sound waves to create images of your heart. It provides your doctor with information about the size and shape of your heart and how well your heart's chambers and valves are working.   You may receive an ultrasound enhancing agent through an IV if needed to better visualize your heart during the echo. This procedure takes approximately one hour.  There are no restrictions for this procedure.  This will take place at [address] [facility] [address]) [address], [place] [address]  Please note: We ask at that you not bring children with you during ultrasound (echo/ vascular) testing. Due to room size and safety concerns, children are not allowed in the ultrasound rooms during exams. Our front office staff cannot provide observation of children in our lobby area while testing is being conducted. An adult accompanying a patient to their appointment will only be allowed in the ultrasound room at the discretion of the ultrasound technician under special circumstances. We apologize for any inconvenience.   Follow-Up: At [facility], you and your health needs are our priority.  As part of our continuing mission to provide you with  exceptional heart care, our providers are all part of one team.  This team includes your primary Cardiologist (physician) and Advanced Practice Providers or APPs (Physician Assistants and Nurse Practitioners) who all work together to provide you with the care you need, when you need it.  Your next appointment:   1 month(s)  Provider:   Antionette Kirks, MD or Laneta Pintos, NP    We recommend signing up for the patient portal called MyChart.  Sign up information is provided on this After Visit Summary.  MyChart is used to connect with patients for Virtual Visits (Telemedicine).  Patients are able to view lab/test results, encounter notes, upcoming appointments, etc.  Non-urgent messages can be sent to your provider as well.   To learn more about what you can do with MyChart, go to [email or web address].

## 2023-08-09 NOTE — Progress Notes (Signed)
 Office Visit    Patient Name: Bobby Flowers Date of Encounter: 08/09/2023  Primary Care Provider:  Lyle San, MD Primary Cardiologist:  Antionette Kirks, MD  Chief Complaint    73 y.o. male with a history of small atrial fibrillation, mitral valve prolapse/mitral regurgitation, hypothyroidism, thoracic aortic aneurysm, and chest pain with normal coronary arteries, who presents for follow-up related to fatigue and dyspnea.  Past Medical History   Subjective   Past Medical History:  Diagnosis Date   Ascending aortic aneurysm (HCC)    a. 06/2017 Echo: Ao root 4.4cm; b. 03/2023 CTA Chest: Asc Ao 4.5 cm.   Chest tightness    a. 05/2017 Cath: nl cors; b. 11/2022 MV: EF 60-70%, no isch/scar, no signif cor Ca2+. Low risk study.   Chronic pain of left lower extremity    right   COPD (chronic obstructive pulmonary disease) (HCC)    DDD (degenerative disc disease), lumbosacral    Diastolic dysfunction    a. 10/2020 Echo: EF 55-60%, no rwma, GrI DD.   Headache    migraines in past. none for 6-8 yrs.   History of kidney stones    Hypothyroidism    Mitral valve prolapse    a. 09/2015 Echo: EF 55-60%, no rwma, nl LA size, nl RV fxn, nl PASP; b. 06/2017 Echo: EF 55-60%, no rwma, Gr2 DD. Asc Ao 4.4cm. Mild MR (? mild prolapse). Mildly dil LA; c. 10/2020 Echo: EF 55-60%, no rwma, GrI DD, nl RV fxn, mild MR/AI.   PAF (paroxysmal atrial fibrillation) (HCC)    a. Dx in 1980's; b. CHA2DS2VASc = 1 - briefly on coumadin many years ago-->caused rash; c. Chronic Amio 200mg  QD.   Wears contact lenses    Past Surgical History:  Procedure Laterality Date   CARDIAC CATHETERIZATION     COLONOSCOPY  04/25/2005   COLONOSCOPY WITH PROPOFOL  N/A 07/22/2015   Procedure: COLONOSCOPY WITH PROPOFOL ;  Surgeon: Marshall Skeeter, MD;  Location: [facility];  Service: Endoscopy;  Laterality: N/A;   COLONOSCOPY WITH PROPOFOL  N/A 09/04/2020   Procedure: COLONOSCOPY WITH PROPOFOL ;  Surgeon: Marshall Skeeter, MD;   Location: [facility];  Service: Endoscopy;  Laterality: N/A;   CYSTOSCOPY WITH HOLMIUM LASER LITHOTRIPSY Bilateral 07/28/2017   Procedure: CYSTOSCOPY WITH HOLMIUM LASER LITHOTRIPSY;  Surgeon: Samson Croak, MD;  Location: [facility];  Service: Urology;  Laterality: Bilateral;   CYSTOSCOPY WITH STENT PLACEMENT Bilateral 07/28/2017   Procedure: CYSTOSCOPY WITH STENT PLACEMENT;  Surgeon: Samson Croak, MD;  Location: [facility];  Service: Urology;  Laterality: Bilateral;   CYSTOSCOPY WITH URETEROSCOPY Bilateral 07/28/2017   Procedure: CYSTOSCOPY WITH URETEROSCOPY;  Surgeon: Samson Croak, MD;  Location: [facility];  Service: Urology;  Laterality: Bilateral;   CYSTOSCOPY/URETEROSCOPY/HOLMIUM LASER/STENT PLACEMENT Left 11/09/2021   Procedure: CYSTOSCOPY/URETEROSCOPY/HOLMIUM LASER/STENT PLACEMENT;  Surgeon: Geraline Knapp, MD;  Location: [facility];  Service: Urology;  Laterality: Left;   EYE SURGERY Left    cataract surgery   HEMORROIDECTOMY     HERNIA REPAIR Left 05/22/2015   Left inguinal hernia repair with medium Ultra Pro mesh   INGUINAL HERNIA REPAIR Left 07/22/2015   Procedure: HERNIA REPAIR INGUINAL ADULT;  Surgeon: Marshall Skeeter, MD;  Location: [facility];  Service: General;  Laterality: Left;   LEFT HEART CATH AND CORONARY ANGIOGRAPHY Left 05/29/2017   Procedure: LEFT HEART CATH AND CORONARY ANGIOGRAPHY;  Surgeon: Wenona Hamilton, MD;  Location: [facility];  Service: Cardiovascular;  Laterality: Left;  LUMBAR FUSION N/A    LUMBAR LAMINECTOMY/DECOMPRESSION MICRODISCECTOMY Right 07/01/2019   Procedure: OPEN L4 LAMINECTOMY, L3/4 DISCECTOMY ON RIGHT, L4/5 DISCECTOMY;  Surgeon: Berta Brittle, MD;  Location: [facility];  Service: Neurosurgery;  Laterality: Right;   myringotomy     with tubes x 5.  tubes fall out frequently   MYRINGOTOMY WITH TUBE PLACEMENT Left 12/13/2018   Procedure: MYRINGOTOMY WITH T-TUBE PLACEMENT;  Surgeon: Rogers Clayman, MD;  Location: [facility];  Service: ENT;  Laterality: Left;  needs to stay first case vaught / juengel doing case together   NASAL SINUS SURGERY     NASOPHARYNGOSCOPY EUSTATION TUBE BALLOON DILATION Left 12/13/2018   Procedure: NASOPHARYNGOSCOPY EUSTATION TUBE BALLOON DILATION WITH OUTFRACTURE OF TURBINATES;  Surgeon: Rogers Clayman, MD;  Location: [facility];  Service: ENT;  Laterality: Left;   SPINE SURGERY N/A    TOTAL HIP ARTHROPLASTY Right 03/26/2021   Procedure: TOTAL HIP ARTHROPLASTY ANTERIOR APPROACH;  Surgeon: Molli Angelucci, MD;  Location: [facility];  Service: Orthopedics;  Laterality: Right;   VASECTOMY      Allergies  Allergies  Allergen Reactions   Adhesive [Tape] Rash    Paper tape okay   Coumadin [Warfarin] Rash       History of Present Illness      73 y.o. y/o male with a history of paroxysmal atrial fibrillation, mitral valve prolapse/mitral regurgitation, hypothyroidism, thoracic aortic aneurysm, and chest pain with normal coronary arteries.  He was initially diagnosed with paroxysmal atrial fibrillation in the 1980s.  At 1 point, he was on warfarin but had an allergic reaction with a rash.  He has not been anticoagulated since, and has been maintained on low-dose amiodarone .  He was previously evaluated in A-fib clinic in 2015, and it was felt that given stability on low-dose amiodarone , continued medical therapy was appropriate.  He had recurrent atrial fibrillation in 2016, we converted back to sinus rhythm after increasing the dose of amiodarone  to 200 mg daily.  In April 2019, he reported symptoms of unstable angina and underwent diagnostic catheterization revealing normal coronary arteries.    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  CT chest showed changes suggestive of emphysema with no evidence of interstitial lung disease (previously quit smoking in 2002).  Findings were felt to be consistent with mild COPD.   He was seen by Dr. Marven Slimmer and decision was made to continue amiodarone .  In late September 2024, he was seen in the emergency department with left-sided chest pain with normal troponins.  He subsequently underwent stress testing in October 2024 showing an EF of 60 to 70% without ischemia or scar.   Mr. Lapinsky was last seen in cardiology clinic in December 2024, at which time he was doing well.  He subsequently followed with CT surgery in February with CT at that time showing stable thoracic ascending aortic aneurysm of 4.5 cm.  He was seen by primary care on June 11 and he was noted to be bradycardic with a heart rate of 46 and encouraged to f/u with cardiology.  Mr. Trulson says that for the past 3 to 4 months, he has noted more fatigue than usual.  He has some degree of chronic dyspnea on exertion which is unchanged but notes that his energy level overall has reduced.  He has been tracking his heart rates on his watch and has more frequently seen rates in the 40s than what he was previously accustomed to.  He tends to feel fatigued regardless of heart rate, however.  He continues to have intermittent paroxysms of atrial fibrillation, usually occurring 1-2 times per week or less, lasting a few minutes, and resolving spontaneously.  Heart rates can run 130-150 while in A-fib and those episodes are associated with more profound fatigue.  He denies chest pain, PND, orthopnea, dizziness, syncope, or early satiety.  He he sometimes notes mild lower extremity swelling at the end of the day. Objective   Home Medications    Current Outpatient Medications  Medication Sig Dispense Refill   acetaminophen  (TYLENOL ) 500 MG tablet Take 500 mg by mouth every 6 (six) hours as needed for moderate pain or headache.     albuterol  (VENTOLIN  HFA) 108 (90 Base) MCG/ACT inhaler Inhale 2 puffs into the lungs every 6 (six) hours as needed for wheezing or shortness of breath. 8 g 2   amiodarone  (PACERONE ) 200 MG tablet TAKE 1  TABLET BY MOUTH DAILY 90 tablet 3   amoxicillin-clavulanate (AUGMENTIN) 875-125 MG tablet Take 1 tablet by mouth 2 (two) times daily.     aspirin  81 MG EC tablet Take 81 mg by mouth daily.     cholecalciferol  (VITAMIN D3) 25 MCG (1000 UNIT) tablet Take 1,000 Units by mouth daily.     latanoprost  (XALATAN ) 0.005 % ophthalmic solution Place 1 drop into both eyes at bedtime.      levothyroxine  (SYNTHROID , LEVOTHROID) 75 MCG tablet Take 75 mcg by mouth See admin instructions. Take 75 mcg daily Monday through Saturday, skip Sunday dose     Multiple Vitamins-Minerals (MULTIVITAMIN WITH MINERALS) tablet Take 1 tablet by mouth daily.     atenolol  (TENORMIN ) 25 MG tablet Take 0.5 tablets (12.5 mg total) by mouth daily. 45 tablet 3   No current facility-administered medications for this visit.     Physical Exam    VS:  BP 120/70 (BP Location: Left Arm, Patient Position: Sitting)   Pulse (!) 51   Ht 5' 9 (1.753 m)   Wt 182 lb 12.8 oz (82.9 kg)   SpO2 98%   BMI 26.99 kg/m  , BMI Body mass index is 26.99 kg/m.       GEN: Well nourished, well developed, in no acute distress. HEENT: normal. Neck: Supple, no JVD, carotid bruits, or masses. Cardiac: RRR, 2/6 systolic murmur heard throughout.  No rubs or gallops.. No clubbing, cyanosis, edema.  Radials 2+/PT 2+ and equal bilaterally.  Respiratory:  Respirations regular and unlabored, clear to auscultation bilaterally. GI: Soft, nontender, nondistended, BS + x 4. MS: no deformity or atrophy. Skin: warm and dry, no rash. Neuro:  Strength and sensation are intact. Psych: Normal affect.  Accessory Clinical Findings    ECG personally reviewed by me today - EKG Interpretation Date/Time:  Wednesday August 09 2023 11:20:46 EDT Ventricular Rate:  51 PR Interval:  266 QRS Duration:  92 QT Interval:  458 QTC Calculation: 422 R Axis:   151  Text Interpretation: Sinus bradycardia with 1st degree A-V block Anterolateral infarct (cited on or before  20-Feb-2023) T wave abnormality, consider inferior ischemia Confirmed by Laneta Pintos [id number]) on 08/09/2023 11:35:17 AM  - no acute changes.  Labs dated July 26, 2023 in Care Everywhere:  Hemoglobin 14.5, hematocrit 44.3, WBC 7.2, platelets 160 Sodium 141, potassium 5.5, chloride 105, CO2 32, BUN 21, creatinine 1.1, glucose 85 Calcium 9.1, albumin 4.1, total protein 6.0 Total bilirubin 1.3, alkaline phosphatase 75, AST 15, ALT 15 Total cholesterol 166, triglycerides 252, HDL 34.8,  LDL 81 TSH 4.020, free T4 1.61    Assessment & Plan    1.  Fatigue/dyspnea on exertion: Patient with more pronounced fatigue and dyspnea on exertion over the past several months.  Previously diagnosed with emphysema without evidence of pulmonary fibrosis on CT and not felt to represent amiodarone  lung injury.  He has since had stable dyspnea on exertion but is noted more frequent fatigue and reduced energy in the absence of chest pain.  Concurrently, he has noted more frequent episodes of bradycardia with rate sometimes in the 40s.  Lab work through primary care earlier this month was relatively unremarkable with the exception of hyperkalemia and a potassium of 5.5, which will be repeated today.  He is in sinus rhythm with a first-degree AV block and a heart rate of 51 today, which is relatively stable over time.  I am going to reduce his atenolol  to 12.5 mg daily and follow-up an echocardiogram.    2.  Sinus bradycardia: As noted above, patient has noted frequent rates in the 40s on home monitoring, also documented at primary care visit.  He has a long history of sinus bradycardia with rates in the low 50s.  He has normal chronotropic response with ambulation.  He has not had any presyncope or syncope.  Reducing atenolol  to 12.5 mg daily.  He remains on amiodarone  200 mg daily in the setting of paroxysmal atrial fibrillation.  Follow-up echo was outlined above.  3.  Paroxysmal atrial fibrillation: Relatively stable,  experiencing an episode of A-fib associated with profound fatigue about 1-2 times per week, lasting a few minutes, resolving spontaneously.  He has not had any prolonged paroxysms.  As above, reducing atenolol  in the setting of resting bradycardia and fatigue.  He remains on amiodarone  200 mg daily.  We discussed potentially placing an event monitor outpatient feels that he is very symptomatic when in A-fib and would be able to tell us  if burden has increased.  CHA2DS2VASc = 1.  He is on aspirin  only.  If he has more frequent paroxysms of Afib on lower dose ? blocker, will have him f/u w/ EP to rediscuss ablative option.  4.  Mitral valve prolapse/mitral regurgitation: Mild MR on echo in 2022.  Follow-up echo in the setting of above.  5.  Hypothyroidism: On levothyroxine  with recent normal TSH and minimally elevated free T4 at 1.17.  Followed by primary care.  6.  Thoracic aortic aneurysm: Stable 4.5 cm ending thoracic aortic aneurysm on CTA earlier this year.  Heart rate and blood pressure stable.  Followed by CT surgery.  7.  History of chest pain: Normal coronary arteries by catheterization in 2019 with a low risk Myoview  in October 2024.  Denies any recent chest pain.  Continue aspirin .  Reducing beta-blocker dose.  8.  Hyperkalemia:  K 5.5 on labs earlier this month.  Will f/u today.  9.  Disposition:  F/u K today.  Arrange echo.  F/u in clinic in 1 month or sooner if necessary.  Laneta Pintos, NP 08/09/2023, 12:40 PM

## 2023-08-10 ENCOUNTER — Ambulatory Visit: Payer: Self-pay | Admitting: Nurse Practitioner

## 2023-08-10 DIAGNOSIS — E875 Hyperkalemia: Secondary | ICD-10-CM

## 2023-08-10 LAB — POTASSIUM: Potassium: 5.6 mmol/L — ABNORMAL HIGH (ref 3.5–5.2)

## 2023-08-11 ENCOUNTER — Other Ambulatory Visit
Admission: RE | Admit: 2023-08-11 | Discharge: 2023-08-11 | Disposition: A | Discharge status: Home / Self Care | Attending: Nurse Practitioner | Admitting: Nurse Practitioner

## 2023-08-11 ENCOUNTER — Ambulatory Visit: Payer: Self-pay | Admitting: Nurse Practitioner

## 2023-08-11 DIAGNOSIS — E875 Hyperkalemia: Secondary | ICD-10-CM | POA: Insufficient documentation

## 2023-08-11 LAB — BASIC METABOLIC PANEL WITH GFR
Anion gap: 6 (ref 5–15)
BUN: 18 mg/dL (ref 8–23)
CO2: 27 mmol/L (ref 22–32)
Calcium: 8.6 mg/dL — ABNORMAL LOW (ref 8.9–10.3)
Chloride: 106 mmol/L (ref 98–111)
Creatinine, Ser: 0.95 mg/dL (ref 0.61–1.24)
GFR, Estimated: >60 mL/min (ref >60)
Glucose, Bld: 87 mg/dL (ref 70–99)
Potassium: 4 mmol/L (ref 3.5–5.1)
Sodium: 139 mmol/L (ref 135–145)

## 2023-08-21 ENCOUNTER — Ambulatory Visit: Attending: Nurse Practitioner

## 2023-08-21 ENCOUNTER — Ambulatory Visit: Admitting: Student

## 2023-08-21 DIAGNOSIS — R0609 Other forms of dyspnea: Secondary | ICD-10-CM

## 2023-08-22 LAB — ECHOCARDIOGRAM COMPLETE
AV Mean grad: 8 mmHg
AV Peak grad: 15.2 mmHg
Ao pk vel: 1.95 m/s
Area-P 1/2: 3.12 cm2
S' Lateral: 3.85 cm

## 2023-09-04 ENCOUNTER — Other Ambulatory Visit: Payer: Self-pay | Admitting: Surgery

## 2023-09-04 DIAGNOSIS — I7121 Aneurysm of the ascending aorta, without rupture: Secondary | ICD-10-CM

## 2023-09-21 NOTE — Progress Notes (Unsigned)
 Office Visit    Patient Name: Bobby Flowers Date of Encounter: 09/22/2023  Primary Care Provider:  Valora Lynwood, MD Primary Cardiologist:  Deatrice Cage, MD  Cardiology APP:  Vivienne Lonni Ingle, NP  Electrophysiologist:  OLE ONEIDA HOLTS, MD   Chief Complaint    73 y.o. male with a history of paroxysmal atrial fibrillation, mitral valve prolapse/mitral regurgitation, hypothyroidism, thoracic aortic aneurysm, and chest pain with normal coronary arteries, who presents for follow-up related to bradycardia.  Past Medical History   Subjective   Past Medical History:  Diagnosis Date   Ascending aortic aneurysm (HCC)    a. 06/2017 Echo: Ao root 4.4cm; b. 03/2023 CTA Chest: Asc Ao 4.5 cm.   Chest tightness    a. 05/2017 Cath: nl cors; b. 11/2022 MV: EF 60-70%, no isch/scar, no signif cor Ca2+. Low risk study.   Chronic pain of left lower extremity    right   COPD (chronic obstructive pulmonary disease) (HCC)    DDD (degenerative disc disease), lumbosacral    Diastolic dysfunction    a. 10/2020 Echo: EF 55-60%, no rwma, GrI DD; b. 07/2023 Echo: EF 50 to 55% w/o rwma, Gr2 DD, nl RV fxn, mild MR/AI, and 44 mm Asc Ao.   Headache    migraines in past. none for 6-8 yrs.   History of kidney stones    Hypothyroidism    Mitral valve prolapse    a. 09/2015 Echo: EF 55-60%, no rwma, nl LA size, nl RV fxn, nl PASP; b. 06/2017 Echo: EF 55-60%, no rwma, Gr2 DD. Asc Ao 4.4cm. Mild MR (? mild prolapse). Mildly dil LA; c. 10/2020 Echo: EF 55-60%, no rwma, GrI DD, nl RV fxn, mild MR/AI; d. 07/2023 Echo: Mild MR.   PAF (paroxysmal atrial fibrillation) (HCC)    a. Dx in 1980's; b. CHA2DS2VASc = 1 - briefly on coumadin many years ago-->caused rash; c. Chronic Amio 200mg  QD.   Wears contact lenses    Past Surgical History:  Procedure Laterality Date   CARDIAC CATHETERIZATION     COLONOSCOPY  04/25/2005   COLONOSCOPY WITH PROPOFOL  N/A 07/22/2015   Procedure: COLONOSCOPY WITH PROPOFOL ;  Surgeon:  Reyes LELON Cota, MD;  Location: [facility];  Service: Endoscopy;  Laterality: N/A;   COLONOSCOPY WITH PROPOFOL  N/A 09/04/2020   Procedure: COLONOSCOPY WITH PROPOFOL ;  Surgeon: Cota Reyes LELON, MD;  Location: [facility];  Service: Endoscopy;  Laterality: N/A;   CYSTOSCOPY WITH HOLMIUM LASER LITHOTRIPSY Bilateral 07/28/2017   Procedure: CYSTOSCOPY WITH HOLMIUM LASER LITHOTRIPSY;  Surgeon: Carolee Sherwood JONETTA DOUGLAS, MD;  Location: [facility];  Service: Urology;  Laterality: Bilateral;   CYSTOSCOPY WITH STENT PLACEMENT Bilateral 07/28/2017   Procedure: CYSTOSCOPY WITH STENT PLACEMENT;  Surgeon: Carolee Sherwood JONETTA DOUGLAS, MD;  Location: [facility];  Service: Urology;  Laterality: Bilateral;   CYSTOSCOPY WITH URETEROSCOPY Bilateral 07/28/2017   Procedure: CYSTOSCOPY WITH URETEROSCOPY;  Surgeon: Carolee Sherwood JONETTA DOUGLAS, MD;  Location: [facility];  Service: Urology;  Laterality: Bilateral;   CYSTOSCOPY/URETEROSCOPY/HOLMIUM LASER/STENT PLACEMENT Left 11/09/2021   Procedure: CYSTOSCOPY/URETEROSCOPY/HOLMIUM LASER/STENT PLACEMENT;  Surgeon: Twylla Glendia BROCKS, MD;  Location: [facility];  Service: Urology;  Laterality: Left;   EYE SURGERY Left    cataract surgery   HEMORROIDECTOMY     HERNIA REPAIR Left 05/22/2015   Left inguinal hernia repair with medium Ultra Pro mesh   INGUINAL HERNIA REPAIR Left 07/22/2015   Procedure: HERNIA REPAIR INGUINAL ADULT;  Surgeon: Reyes LELON Cota, MD;  Location: [facility];  Service: General;  Laterality: Left;   LEFT HEART CATH AND CORONARY ANGIOGRAPHY Left 05/29/2017   Procedure: LEFT HEART CATH AND CORONARY ANGIOGRAPHY;  Surgeon: Darron Deatrice LABOR, MD;  Location: [facility];  Service: Cardiovascular;  Laterality: Left;   LUMBAR FUSION N/A    LUMBAR LAMINECTOMY/DECOMPRESSION MICRODISCECTOMY Right 07/01/2019   Procedure: OPEN L4 LAMINECTOMY, L3/4 DISCECTOMY ON RIGHT, L4/5 DISCECTOMY;  Surgeon: Bluford Standing, MD;  Location: [facility];  Service: Neurosurgery;  Laterality: Right;    myringotomy     with tubes x 5.  tubes fall out frequently   MYRINGOTOMY WITH TUBE PLACEMENT Left 12/13/2018   Procedure: MYRINGOTOMY WITH T-TUBE PLACEMENT;  Surgeon: Milissa Hamming, MD;  Location: [facility];  Service: ENT;  Laterality: Left;  needs to stay first case vaught / juengel doing case together   NASAL SINUS SURGERY     NASOPHARYNGOSCOPY EUSTATION TUBE BALLOON DILATION Left 12/13/2018   Procedure: NASOPHARYNGOSCOPY EUSTATION TUBE BALLOON DILATION WITH OUTFRACTURE OF TURBINATES;  Surgeon: Milissa Hamming, MD;  Location: [facility];  Service: ENT;  Laterality: Left;   SPINE SURGERY N/A    TOTAL HIP ARTHROPLASTY Right 03/26/2021   Procedure: TOTAL HIP ARTHROPLASTY ANTERIOR APPROACH;  Surgeon: Kathlynn Sharper, MD;  Location: [facility];  Service: Orthopedics;  Laterality: Right;   VASECTOMY      Allergies  Allergies  Allergen Reactions   Adhesive [Tape] Rash    Paper tape okay   Coumadin [Warfarin] Rash       History of Present Illness      72 y.o. y/o male with a history of paroxysmal atrial fibrillation, mitral valve prolapse/mitral regurgitation, hypothyroidism, thoracic aortic aneurysm, and chest pain with normal coronary arteries.  He was initially diagnosed with paroxysmal atrial fibrillation in the 1980s.  At one point, he was on warfarin but had an allergic reaction with a rash.  He has not been anticoagulated since, and has been maintained on low-dose amiodarone .  He was previously evaluated in A-fib clinic in 2015, and it was felt that given stability on low-dose amiodarone , continued medical therapy was appropriate.  He had recurrent atrial fibrillation in 2016, and converted back to sinus rhythm after increasing the dose of amiodarone  to 200 mg daily.  In April 2019, he reported symptoms of unstable angina and underwent diagnostic catheterization revealing normal coronary arteries.    In 2022, he developed worsening dyspnea with concern for possible  amiodarone  induced lung toxicity.  CT chest showed changes suggestive of emphysema with no evidence of interstitial lung disease (previously quit smoking in 2002).  Findings were felt to be consistent with mild COPD.  He was seen by Dr. Cindie and decision was made to continue amiodarone .  In late September 2024, he was seen in the emergency department with left-sided chest pain with normal troponins.  He subsequently underwent stress testing in October 2024 showing an EF of 60 to 70% without ischemia or scar.  CTA of the chest and aorta in February 2025 showed stable thoracic ascending aortic aneurysm of 4.5 cm.     Mr. Rodman was last seen in cardiology clinic in June 2025 following a primary care visit where he was noted to be bradycardic with a heart rate of 46.  At cardiology follow-up, he reported more fatigue than usual and some degree of chronic, unchanged dyspnea on exertion.  He also noted more frequent heart rate trends in the 40s on his watch at home.  He also reported ongoing intermittent paroxysms of atrial fibrillation, usually occurring  1-2 times per week, lasting a few minutes, resolving spontaneously.  I reduced his atenolol  to 12.5 mg daily.  Follow-up labs were obtained in the setting of prior hyperkalemia, and potassium returned at 5.6.  We followed up labs in the hospital lab on June 20, with normal potassium of 4.0.  Echocardiogram was obtained showing an EF of 50 to 55% without regional wall motion abnormalities, grade 2 diastolic dysfunction, normal RV function, mild MR, mild AI, and 44 mm ascending aorta.  Since his last visit, he notes that heart rates have been trending in the upper 50s and sometimes 60s.  Fatigue has improved some, though he notes that because it is so hot outside, he is doing less.  He continues to have what he believes are brief episodes of atrial fibrillation occurring several nights a week, usually while lying down for bed at night, and resolving within a few  minutes.  These are not bothersome to him.  He denies chest pain, dyspnea, PND, orthopnea, dizziness, syncope, edema, or early satiety. Objective   Home Medications    Current Outpatient Medications  Medication Sig Dispense Refill   acetaminophen  (TYLENOL ) 500 MG tablet Take 500 mg by mouth every 6 (six) hours as needed for moderate pain or headache.     albuterol  (VENTOLIN  HFA) 108 (90 Base) MCG/ACT inhaler Inhale 2 puffs into the lungs every 6 (six) hours as needed for wheezing or shortness of breath. 8 g 2   amiodarone  (PACERONE ) 200 MG tablet TAKE 1 TABLET BY MOUTH DAILY 90 tablet 3   amoxicillin-clavulanate (AUGMENTIN) 875-125 MG tablet Take 1 tablet by mouth 2 (two) times daily.     aspirin  81 MG EC tablet Take 81 mg by mouth daily.     atenolol  (TENORMIN ) 25 MG tablet Take 0.5 tablets (12.5 mg total) by mouth daily. 45 tablet 3   cholecalciferol  (VITAMIN D3) 25 MCG (1000 UNIT) tablet Take 1,000 Units by mouth daily.     latanoprost  (XALATAN ) 0.005 % ophthalmic solution Place 1 drop into both eyes at bedtime.      levothyroxine  (SYNTHROID , LEVOTHROID) 75 MCG tablet Take 75 mcg by mouth See admin instructions. Take 75 mcg daily Monday through Saturday, skip Sunday dose     Multiple Vitamins-Minerals (MULTIVITAMIN WITH MINERALS) tablet Take 1 tablet by mouth daily.     No current facility-administered medications for this visit.     Physical Exam    VS:  BP (!) 115/59 (BP Location: Right Arm, Patient Position: Sitting, Cuff Size: Normal)   Pulse (!) 57   Ht 5' 9 (1.753 m)   Wt 182 lb 6.4 oz (82.7 kg)   SpO2 98%   BMI 26.94 kg/m  , BMI Body mass index is 26.94 kg/m.          GEN: Well nourished, well developed, in no acute distress. HEENT: normal. Neck: Supple, no JVD, carotid bruits, or masses. Cardiac: RRR, 2/6 systolic murmur heard throughout.  No rubs or gallops. No clubbing, cyanosis, edema.  Radials 2+/PT 2+ and equal bilaterally.  Respiratory:  Respirations regular  and unlabored, clear to auscultation bilaterally. GI: Soft, nontender, nondistended, BS + x 4. MS: no deformity or atrophy. Skin: warm and dry, no rash. Neuro:  Strength and sensation are intact. Psych: Normal affect.  Accessory Clinical Findings    ECG personally reviewed by me today - EKG Interpretation Date/Time:  Friday September 22 2023 15:25:24 EDT Ventricular Rate:  57 PR Interval:  264 QRS Duration:  94 QT  Interval:  426 QTC Calculation: 414 R Axis:   143  Text Interpretation: Sinus bradycardia with 1st degree A-V block Anterolateral infarct (cited on or before 20-Feb-2023) T wave abnormality, consider inferior ischemia No significant change since last tracing Confirmed by Vivienne Bruckner [phone]) on 09/22/2023 3:39:02 PM  - no acute changes.  Lab Results  Component Value Date   WBC 8.2 12/01/2022   HGB 15.6 12/01/2022   HCT 46.9 12/01/2022   MCV 90.4 12/01/2022   PLT 198 12/01/2022   Lab Results  Component Value Date   CREATININE 0.95 08/11/2023   BUN 18 08/11/2023   NA 139 08/11/2023   K 4.0 08/11/2023   CL 106 08/11/2023   CO2 27 08/11/2023   Lab Results  Component Value Date   ALT 14 11/23/2022   AST 16 11/23/2022   ALKPHOS 80 11/23/2022   BILITOT 1.0 11/23/2022   Lab Results  Component Value Date   TSH 3.660 11/23/2022       Assessment & Plan    1.  Fatigue/dyspnea on exertion: Patient seen in June with complaints of more pronounced fatigue and dyspnea on exertion over a several month period.  Follow-up echocardiogram showed low normal LV function with an EF of 50-55%, grade 2 diastolic dysfunction, mild MR, mild AI, and 4.4 cm ascending aorta.  He was experiencing sinus bradycardia with rates frequently in the 40s at home at the time of his last visit and since reducing his atenolol  dose, rates are trending in the 50s and he has had improvement in fatigue.  He denies dyspnea or chest pain today.  No further workup planned at this time.  2.  Sinus  bradycardia: As above, patient was experiencing heart rates frequently in the 40s on home monitoring but after reducing his atenolol  to 12.5 mg daily, rates have been trending in the upper 50s to 60s.  Fatigue has improved.  ECG today with sinus bradycardia and first-degree AV block at 57 bpm.  We did discuss potentially switching him to metoprolol therapy as he is having trouble cutting the atenolol  but he prefers to just remain on the atenolol  at this time.  3.  Paroxysmal atrial fibrillation: Overall stable.  He is accustomed to experiencing brief episodes of A-fib, lasting 3 to 5 minutes, several days per week, typically when he first gets into bed at night.  This is overall stable.  He remains on amiodarone  and low-dose atenolol , and is not anticoagulated in the setting of a CHA2DS2-VASc of 1.  We previously discussed that if he were to have more paroxysms of A-fib, that we could arrange for EP evaluation to rediscuss ablative options.  4.  Mitral valve prolapse/mild mitral regurgitation: Recent echo with mild MR-overall stable.  5.  Hypothyroidism: On levothyroxine  and followed by primary care.  6.  Thoracic aortic aneurysm: Stable, 4.5 cm ascending thoracic aortic aneurysm on CTA earlier this year with plan for follow-up again this month.  He is followed by CT surgery in [place].  7.  History of chest pain: Normal coronary arteries by catheterization in 2019 with low risk Myoview  in October 2024.  He has not any recent chest pain.  He remains on aspirin  and low-dose beta-blocker.  8.  Hyperkalemia: We obtained labs at his last office visit which showed a potassium of 5.6 but follow-up potassium of 4.0 via the hospital lab.  I suspect our in office lab was spurious.  9.  Disposition: Follow-up in clinic in 6 months or sooner if  necessary.  Lonni Meager, NP 09/22/2023, 3:58 PM

## 2023-09-22 ENCOUNTER — Ambulatory Visit: Attending: Nurse Practitioner | Admitting: Nurse Practitioner

## 2023-09-22 ENCOUNTER — Encounter: Payer: Self-pay | Admitting: Nurse Practitioner

## 2023-09-22 VITALS — BP 115/59 | HR 57 | Ht 69.0 in | Wt 182.4 lb

## 2023-09-22 DIAGNOSIS — R079 Chest pain, unspecified: Secondary | ICD-10-CM | POA: Diagnosis not present

## 2023-09-22 DIAGNOSIS — R0609 Other forms of dyspnea: Secondary | ICD-10-CM | POA: Diagnosis not present

## 2023-09-22 DIAGNOSIS — E875 Hyperkalemia: Secondary | ICD-10-CM

## 2023-09-22 DIAGNOSIS — R001 Bradycardia, unspecified: Secondary | ICD-10-CM

## 2023-09-22 DIAGNOSIS — R5383 Other fatigue: Secondary | ICD-10-CM

## 2023-09-22 DIAGNOSIS — E039 Hypothyroidism, unspecified: Secondary | ICD-10-CM

## 2023-09-22 DIAGNOSIS — I4819 Other persistent atrial fibrillation: Secondary | ICD-10-CM | POA: Diagnosis not present

## 2023-09-22 DIAGNOSIS — I480 Paroxysmal atrial fibrillation: Secondary | ICD-10-CM

## 2023-09-22 DIAGNOSIS — I7121 Aneurysm of the ascending aorta, without rupture: Secondary | ICD-10-CM

## 2023-09-22 DIAGNOSIS — I341 Nonrheumatic mitral (valve) prolapse: Secondary | ICD-10-CM

## 2023-09-22 NOTE — Patient Instructions (Signed)
 Medication Instructions:   Your physician recommends that you continue on your current medications as directed. Please refer to the Current Medication list given to you today.   *If you need a refill on your cardiac medications before your next appointment, please call your pharmacy*  Lab Work:  No labs ordered today   If you have labs (blood work) drawn today and your tests are completely normal, you will receive your results only by: MyChart Message (if you have MyChart) OR A paper copy in the mail If you have any lab test that is abnormal or we need to change your treatment, we will call you to review the results.  Testing/Procedures:  No test ordered today   Follow-Up: At [facility], you and your health needs are our priority.  As part of our continuing mission to provide you with exceptional heart care, our providers are all part of one team.  This team includes your primary Cardiologist (physician) and Advanced Practice Providers or APPs (Physician Assistants and Nurse Practitioners) who all work together to provide you with the care you need, when you need it.  Your next appointment:   6 month(s)  Provider:   You may see Deatrice Cage, MD or one of the following Advanced Practice Providers on your designated Care Team:   Lonni Meager, NP

## 2023-10-10 ENCOUNTER — Ambulatory Visit (HOSPITAL_COMMUNITY)
Admission: RE | Admit: 2023-10-10 | Discharge: 2023-10-10 | Disposition: A | Discharge status: Home / Self Care | Source: Ambulatory Visit | Attending: Surgery | Admitting: Surgery

## 2023-10-10 DIAGNOSIS — I7121 Aneurysm of the ascending aorta, without rupture: Secondary | ICD-10-CM | POA: Diagnosis present

## 2023-10-10 DIAGNOSIS — I517 Cardiomegaly: Secondary | ICD-10-CM | POA: Diagnosis not present

## 2023-10-10 DIAGNOSIS — J438 Other emphysema: Secondary | ICD-10-CM | POA: Diagnosis not present

## 2023-10-10 MED ORDER — IOHEXOL 350 MG/ML SOLN
75.0000 mL | Freq: Once | INTRAVENOUS | Status: AC | PRN
  Administered 2023-10-10: 75 mL via INTRAVENOUS

## 2023-10-16 NOTE — Progress Notes (Unsigned)
 [address] [place] [address]             [phone]            DARE SANGER [phone] [birth year]   History of Present Illness:  [name] is a 73 year old man with medical history of COPD, persistent atrial fibrillation, mitral valve prolapse, chronic lower extremity pain, hypothyroidism who presents for continued surveillance of ascending thoracic aortic aneurysm.  Aneurysm was found on echocardiogram in 2019 and he has been followed by our clinic since 2020.  Aneurysm over the past years has measured 4.5 cm.  On CTA of chest on 10/10/2023 aneurysm measured slightly larger at 4.6 cm. Exercise symtpoms    Current Outpatient Medications on File Prior to Visit  Medication Sig Dispense Refill   acetaminophen  (TYLENOL ) 500 MG tablet Take 500 mg by mouth every 6 (six) hours as needed for moderate pain or headache.     albuterol  (VENTOLIN  HFA) 108 (90 Base) MCG/ACT inhaler Inhale 2 puffs into the lungs every 6 (six) hours as needed for wheezing or shortness of breath. 8 g 2   amiodarone  (PACERONE ) 200 MG tablet TAKE 1 TABLET BY MOUTH DAILY 90 tablet 3   amoxicillin-clavulanate (AUGMENTIN) 875-125 MG tablet Take 1 tablet by mouth 2 (two) times daily.     aspirin  81 MG EC tablet Take 81 mg by mouth daily.     atenolol  (TENORMIN ) 25 MG tablet Take 0.5 tablets (12.5 mg total) by mouth daily. 45 tablet 3   cholecalciferol  (VITAMIN D3) 25 MCG (1000 UNIT) tablet Take 1,000 Units by mouth daily.     latanoprost  (XALATAN ) 0.005 % ophthalmic solution Place 1 drop into both eyes at bedtime.      levothyroxine  (SYNTHROID , LEVOTHROID) 75 MCG tablet Take 75 mcg by mouth See admin instructions. Take 75 mcg daily Monday through Saturday, skip Sunday dose     Multiple Vitamins-Minerals (MULTIVITAMIN WITH MINERALS) tablet Take 1 tablet by mouth daily.     No current facility-administered medications on file prior to visit.     ROS: ROS   There were no  vitals taken for this visit.  Physical Exam   Imaging: EXAM: CTA CHEST AORTA 10/10/2023 12:07:29 PM   TECHNIQUE: CTA of the chest was performed after the administration of intravenous contrast. Multiplanar reformatted images are provided for review. MIP images are provided for review. Automated exposure control, iterative reconstruction, and/or weight based adjustment of the mA/kV was utilized to reduce the radiation dose to as low as reasonably achievable.   COMPARISON: CT angiogram of the chest dated 04/17/1998 and 04/18/2023.   CLINICAL HISTORY: Aortic aneurysm suspected.   FINDINGS:   AORTA: There is fusiform aneurysmal dilatation of the ascending thoracic aorta, which measures approximately 4.6 x 4.6 cm in cross-sectional diameter, slightly increased from 4.5 cm on the previous study. The proximal descending thoracic aorta measures approximately 2.7 cm in cross-sectional diameter and the distal descending thoracic aorta 2.6 cm. The descending thoracic aorta is mildly tortuous.   MEDIASTINUM: The heart is enlarged. The main pulmonary artery is normal in caliber.   LYMPH NODES: No mediastinal, hilar or axillary lymphadenopathy.   LUNGS AND PLEURA: There is mixed attenuation of the lungs. There is mild-to-moderate paraseptal emphysema. No focal consolidation or pulmonary edema. No pleural effusion or pneumothorax.   UPPER ABDOMEN: Limited images of the upper abdomen are unremarkable.   SOFT TISSUES AND BONES:  No acute bone or soft tissue abnormality.   IMPRESSION: 1. Fusiform aneurysmal dilatation of the ascending thoracic aorta, measuring approximately 4.6 x 4.6 cm, slightly increased from 4.5 cm on the previous study. 2. Mildly tortuous descending thoracic aorta, measuring 2.7 cm proximally and 2.6 cm distally. 3. Enlarged heart. 4. 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   Electronically signed by: Evalene Coho MD 10/10/2023 12:32 PM EDT  RP Workstation: [id number]     A/P: Aneurysm of ascending aorta without rupture (HCC) -4.6 cm ascending thoracic aortic aneurysm on CTA of chest.  Echocardiogram from 07/2023 showed the aortic valve is tricuspid with mild aortic valve regurgitation. We discussed the natural history and and risk factors for growth of ascending aortic aneurysms. Discussed recommendations to minimize the risk of further expansion or dissection including careful blood pressure control, avoidance of contact sports and heavy lifting, attention to lipid management.  We covered the importance of smoking ***cessation/staying never user.  The patient does not yet meet surgical criteria of >5.5cm. The patient is aware of signs and symptoms of aortic dissection and when to present to the emergency department   -Follow up in 6 months with CTA of chest    Risk Modification:  Statin:  ***  Smoking cessation instruction/counseling given:  {CHL AMB [facility] SMOKING CESSATION COUNSELING:20758}  Patient was counseled on importance of Blood Pressure Control  They are instructed to contact their Primary Care Physician if they start to have blood pressure readings over 130s/90s. Do not ever stop blood pressure medications on your own, unless instructed by healthcare professional.  Please avoid use of Fluoroquinolones as this can potentially increase your risk of Aortic Rupture and/or Dissection  Patient educated on signs and symptoms of Aortic Dissection, handout also provided in AVS  Manuelita CHRISTELLA Rough, PA-C 10/16/23

## 2023-10-17 ENCOUNTER — Ambulatory Visit

## 2023-10-17 VITALS — BP 137/78 | HR 54 | Resp 20 | Ht 69.0 in | Wt 182.7 lb

## 2023-10-17 DIAGNOSIS — I7121 Aneurysm of the ascending aorta, without rupture: Secondary | ICD-10-CM | POA: Diagnosis not present

## 2023-10-17 NOTE — Patient Instructions (Signed)

## 2024-01-12 NOTE — Progress Notes (Unsigned)
 Electrophysiology Clinic Note    Date:  01/15/2024  Patient ID:  Bobby Flowers, Bobby Flowers 1950-12-17, MRN [id number] PCP:  Valora Lynwood FALCON, MD  Cardiologist:  Deatrice Cage, MD  Cardiology APP:  Vivienne Lonni Ingle, NP  Electrophysiologist:  OLE ONEIDA HOLTS, MD     Discussed the use of AI scribe software for clinical note transcription with the patient, who gave verbal consent to proceed.   Patient Profile    Chief Complaint: AFib follow-up  History of Present Illness: Bobby Flowers is a 73 y.o. male with PMH notable for persis AFib not on OAC d/t low chadsvasc, bradycardia, mitral valve prolapse w mild MR, TAAA, chest pain w normal Coronaries, hypothyroid ; seen today for OLE ONEIDA HOLTS, MD for routine electrophysiology followup.   I last saw him 11/2022 where he had brief paroxysms of AFib on amiodarone , but happy with level of control. He described chest pain during appt, and has since had NM that was low risk. He has seen general cardiology APPs in the interim, with ongoing bradycardia. He last saw NP Vivienne 09/2023 where his pulse had improved with most readings 50-60s at rest on low-dose atenolol .    Since last being seen in our clinic the patient reports doing well.  He has very low A-fib burden, thinks that he maybe had an episode yesterday for about 5 minutes.  He continues to take amiodarone  daily along with 12.5 mg atenolol  daily.  He does continue to have fatigue, is not significantly improved with lowering the atenolol  dose.      Arrhythmia/Device History Amiodarone     ROS:  Please see the history of present illness. All other systems are reviewed and otherwise negative.    Physical Exam    VS:  BP 118/60 (BP Location: Left Arm, Patient Position: Sitting, Cuff Size: Normal)   Pulse (!) 55   Ht 5' 6 (1.676 m)   Wt 184 lb (83.5 kg)   BMI 29.70 kg/m  BMI: Body mass index is 29.7 kg/m.           Wt Readings from Last 3 Encounters:  01/15/24 184  lb (83.5 kg)  10/17/23 182 lb 11.2 oz (82.9 kg)  09/22/23 182 lb 6.4 oz (82.7 kg)     GEN- The patient is well appearing, alert and oriented x 3 today.   Lungs- Clear to ausculation bilaterally, normal work of breathing.  Heart- Regular, bradycardic rate and rhythm, no murmurs, rubs or gallops Extremities- No peripheral edema, warm, dry   Studies Reviewed   Previous EP, cardiology notes.    EKG is ordered. Personal review of EKG from today shows:         TTE, 08/21/2023  1. Left ventricular ejection fraction, by estimation, is 50 to 55%. The left ventricle has low normal function. The left ventricle has no regional wall motion abnormalities. Left ventricular diastolic parameters are consistent with Grade II diastolic dysfunction (pseudonormalization).   2. Right ventricular systolic function is normal. The right ventricular size is normal.   3. Left atrial size was mildly dilated.   4. The mitral valve is normal in structure. Mild mitral valve regurgitation.   5. The aortic valve is tricuspid. Aortic valve regurgitation is mild.  6. Aortic dilatation noted. There is mild dilatation of the ascending aorta, measuring 44mm.   7. The inferior vena cava is normal in size with greater than 50% respiratory variability, suggesting right atrial pressure of 3 mmHg.   NM  Myocardial Spect, 12/02/2022   Probably normal pharmacologic myocardial perfusion stress test.   There is no evidence of significant ischemia or scar, though perfusion images are suboptimal due to motion, extracardiac activity, and positioning of the heart in the thorax.   Left ventricular systolic function is normal (LVEF 60-70%).   There is no significant coronary artery calcification.  Dilation of the ascending aorta is again noted, better evaluated on the CTA chest from 10/20/2022.   Hypodensities in the visualized liver most likely represent cysts.   This is a low risk study.  If clinical concern for ischemic heart disease  persists, consider coronary CTA, myocardial PET/CT, or cardiac catheterization.  TTE, 10/22/2020  1. Left ventricular ejection fraction, by estimation, is 55 to 60%. The left ventricle has normal function. The left ventricle has no regional wall motion abnormalities. Left ventricular diastolic parameters are consistent with Grade I diastolic dysfunction (impaired relaxation).   2. Right ventricular systolic function is normal. The right ventricular size is normal. Tricuspid regurgitation signal is inadequate for assessing PA pressure.   3. The mitral valve is normal in structure. Mild mitral valve regurgitation. No evidence of mitral stenosis.   4. The aortic valve was not well visualized. Aortic valve regurgitation is mild.    TTE, 06/22/2017 - Left ventricle: The cavity size was normal. Systolic function was normal. The estimated ejection fraction was in the range of 55% to 60%. Wall motion was normal; there were no regional wall motion abnormalities. Features are consistent with a pseudonormal left ventricular filling pattern, with concomitant abnormal relaxation and increased filling pressure (grade 2 diastolic dysfunction).  - Ascending aorta: The ascending aorta was moderately dilated, 4.4 cm  - Mitral valve: Structurally normal valve. Unable to exclude mild prolapse. There was mild regurgitation.  - Left atrium: The atrium was mildly dilated.  - Right ventricle: Systolic function was normal.  - Pulmonary arteries: Systolic pressure was within the normal range.    LHC, 05/29/2017 1.  Normal coronary arteries. 2.  Very unusual horizontal orientation of the heart.  I did not do left ventricular angiography given frequent PVCs and unusual orientation of the pigtail catheter.    Assessment and Plan     #) persis Afib #) amiodarone  monitoring #) bradycardia AFib well controlled with brief episodes Update LFT, thyroid labs today Continue 200mg  amiodarone  daily, 12.5mg  atenolol  daily We  discussed AF ablation and he is agreeable to pursuing. Will defer to MD for final decision.   #) Hypercoag d/t afib CHA2DS2-VASc Score = at least 1 [CHF History: 0, HTN History: 0, Diabetes History: 0, Stroke History: 0, Vascular Disease History: 0, Age Score: 1, Gender Score: 0].  Therefore, the patient's annual risk of stroke is 0.6 %.    Not on OAC d/t low chadsvasc score Continue 81mg  ASA Discussed that he would need to start OAC 1 month prior to AF ablation, once scheduled.       Current medicines are reviewed at length with the patient today.   The patient has concerns regarding his medicines.  The following changes were made today:  none  Labs/ tests ordered today include:  Orders Placed This Encounter  Procedures   Hepatic function panel   T4   TSH   EKG 12-Lead     Disposition: Follow up with Dr. Kennyth 2-3 months    Signed, Chantal Needle, NP  01/15/24  9:18 AM  Electrophysiology [facility]

## 2024-01-15 ENCOUNTER — Ambulatory Visit: Attending: Cardiology | Admitting: Cardiology

## 2024-01-15 ENCOUNTER — Encounter: Payer: Self-pay | Admitting: Cardiology

## 2024-01-15 VITALS — BP 118/60 | HR 55 | Ht 66.0 in | Wt 184.0 lb

## 2024-01-15 DIAGNOSIS — Z5181 Encounter for therapeutic drug level monitoring: Secondary | ICD-10-CM

## 2024-01-15 DIAGNOSIS — I4819 Other persistent atrial fibrillation: Secondary | ICD-10-CM | POA: Diagnosis not present

## 2024-01-15 DIAGNOSIS — Z79899 Other long term (current) drug therapy: Secondary | ICD-10-CM

## 2024-01-15 DIAGNOSIS — R001 Bradycardia, unspecified: Secondary | ICD-10-CM

## 2024-01-15 NOTE — Patient Instructions (Signed)
 Medication Instructions:  NO CHANGES  Lab Work: LFTs, TSH, AND T4 TO BE DONE TODAY.  Testing/Procedures: NONE  Follow-Up: At [facility], you and your health needs are our priority.  As part of our continuing mission to provide you with exceptional heart care, our providers are all part of one team.  This team includes your primary Cardiologist (physician) and Advanced Practice Providers or APPs (Physician Assistants and Nurse Practitioners) who all work together to provide you with the care you need, when you need it.  Your next appointment:   6 MONTHS  Provider:   Suzann Riddle, NP

## 2024-01-16 ENCOUNTER — Ambulatory Visit: Payer: Self-pay | Admitting: Cardiology

## 2024-01-16 LAB — HEPATIC FUNCTION PANEL
ALT: 16 IU/L (ref 0–44)
AST: 16 IU/L (ref 0–40)
Albumin: 4 g/dL (ref 3.8–4.8)
Alkaline Phosphatase: 81 IU/L (ref 47–123)
Bilirubin Total: 1.1 mg/dL (ref 0.0–1.2)
Bilirubin, Direct: 0.23 mg/dL (ref 0.00–0.40)
Total Protein: 6.2 g/dL (ref 6.0–8.5)

## 2024-01-16 LAB — T4: T4, Total: 10.5 ug/dL (ref 4.5–12.0)

## 2024-01-16 LAB — TSH: TSH: 2.42 u[IU]/mL (ref 0.450–4.500)

## 2024-01-22 NOTE — Progress Notes (Unsigned)
 Electrophysiology Office Note:   Date:  01/23/2024  ID:  Bobby Flowers, DOB 1951-01-02, MRN [id number]  Primary Cardiologist: Deatrice Cage, MD Electrophysiologist: Fonda Kitty, MD      History of Present Illness:   Bobby Flowers is a 73 y.o. male with h/o persis AFib not on OAC d/t low chadsvasc, bradycardia, mitral valve prolapse w mild MR, TAAA who is being seen today for AF management.  Discussed the use of AI scribe software for clinical note transcription with the patient, who gave verbal consent to proceed.  History of Present Illness Bobby Flowers is a 73 year old male with atrial fibrillation who presents for evaluation of long-term amiodarone  use and consideration of ablation. He was referred by Bobby Flowers for evaluation of his atrial fibrillation management.  He has a long-standing history of atrial fibrillation, first diagnosed in the late 1980s or early 1990s. Despite being on amiodarone , he experiences occasional episodes of atrial fibrillation. He undergoes regular blood work every six months to monitor his condition.  In the late 1980s, he was hospitalized for a suspected blood clot in the heart, which was later determined to be fatty tissue after a transesophageal echocardiogram.  He is on a reduced dose of atenolol , which was halved previously, with no significant difference in symptoms. His blood pressure has been stable, and he describes his heart rate as generally low, which he attributes to the amiodarone .   Review of systems complete and found to be negative unless listed in HPI.   EP Information / Studies Reviewed:    EKG is not ordered today. EKG from 01/15/24 reviewed which showed sinus brady with PR and QRS 98ms.     EKG 2016: AF   Echo 08/21/23: 1. Left ventricular ejection fraction, by estimation, is 50 to 55%. The  left ventricle has low normal function. The left ventricle has no regional  wall motion abnormalities. Left ventricular  diastolic parameters are  consistent with Grade II diastolic  dysfunction (pseudonormalization).   2. Right ventricular systolic function is normal. The right ventricular  size is normal.   3. Left atrial size was mildly dilated.   4. The mitral valve is normal in structure. Mild mitral valve  regurgitation.   5. The aortic valve is tricuspid. Aortic valve regurgitation is mild.   6. Aortic dilatation noted. There is mild dilatation of the ascending  aorta, measuring 44 mm.   7. The inferior vena cava is normal in size with greater than 50%  respiratory variability, suggesting right atrial pressure of 3 mmHg.           Physical Exam:   VS:  BP 100/60 (BP Location: Left Arm, Patient Position: Sitting, Cuff Size: Normal)   Pulse 62   Ht 5' 9 (1.753 m)   Wt 185 lb 3.2 oz (84 kg)   SpO2 97%   BMI 27.35 kg/m    Wt Readings from Last 3 Encounters:  01/23/24 185 lb 3.2 oz (84 kg)  01/15/24 184 lb (83.5 kg)  10/17/23 182 lb 11.2 oz (82.9 kg)     General: Well developed, in no acute distress.  Neck: No JVD.  Cardiac: Normal rate, regular rhythm.  Resp: Normal work of breathing.  Ext: No edema.  Neuro: No gross focal deficits.  Psych: Normal affect.    ASSESSMENT AND PLAN:    # Persistent atrial fibrillation: Symptomatic. Continues to have episodes despite amiodarone .  # High risk medication use: Amiodarone . LFTs normal 01/15/24. TSH  normal 01/15/24. -Discussed treatment options today for AF including antiarrhythmic drug therapy and ablation. Discussed risks, recovery and likelihood of success with each treatment strategy. Risk, benefits, and alternatives to EP study and ablation for afib were discussed. These risks include but are not limited to stroke, bleeding, vascular damage, tamponade, perforation, damage to the esophagus, lungs, phrenic nerve and other structures, pulmonary vein stenosis, worsening renal function, coronary vasospasm and death.  Discussed potential need for  repeat ablation procedures and antiarrhythmic drugs after an initial ablation. The patient understands these risk and wishes to proceed.  We will therefore proceed with catheter ablation at the next available time.  Carto, ICE, anesthesia are requested for the procedure.   -EP study at time of ablation. -We will not obtain CT PV protocol prior to the procedure. -Continue amiodarone  200mg  once daily as a bridge to ablation. -Continue atenolol  12.5mg  daily.  # Hypercoagulable state due to atrial fibrillation: -Patient has not been on anti-coagulation. Will start Eliquis  5mg  BID in anticipation of catheter ablation.    # Sinus bradycardia: Likely secondary to amiodarone  and atenolol  for AF.  -Continue to monitor.   Follow up with Dr. Kennyth 3 months after ablation.   Signed, Fonda Kennyth, MD

## 2024-01-23 ENCOUNTER — Encounter: Payer: Self-pay | Admitting: Cardiology

## 2024-01-23 ENCOUNTER — Other Ambulatory Visit: Payer: Self-pay

## 2024-01-23 ENCOUNTER — Ambulatory Visit: Attending: Cardiology | Admitting: Cardiology

## 2024-01-23 VITALS — BP 100/60 | HR 62 | Ht 69.0 in | Wt 185.2 lb

## 2024-01-23 DIAGNOSIS — R001 Bradycardia, unspecified: Secondary | ICD-10-CM

## 2024-01-23 DIAGNOSIS — D6869 Other thrombophilia: Secondary | ICD-10-CM

## 2024-01-23 DIAGNOSIS — Z79899 Other long term (current) drug therapy: Secondary | ICD-10-CM

## 2024-01-23 DIAGNOSIS — I4819 Other persistent atrial fibrillation: Secondary | ICD-10-CM

## 2024-01-23 MED ORDER — APIXABAN 5 MG PO TABS: 5.0000 mg | ORAL_TABLET | Freq: Two times a day (BID) | ORAL | 3 refills | Status: DC

## 2024-01-23 NOTE — Patient Instructions (Addendum)
 Medication Instructions:  Your physician has recommended you make the following change in your medication:  1) START taking Eliquis  5 mg twice daily on 12/10  *If you need a refill on your cardiac medications before your next appointment, please call your pharmacy*  Testing/Procedures: Ablation Your physician has recommended that you have an ablation. Catheter ablation is a medical procedure used to treat some cardiac arrhythmias (irregular heartbeats). During catheter ablation, a long, thin, flexible tube is put into a blood vessel in your groin (upper thigh), or neck. This tube is called an ablation catheter. It is then guided to your heart through the blood vessel. Radio frequency waves destroy small areas of heart tissue where abnormal heartbeats may cause an arrhythmia to start.   You are scheduled for Atrial Fibrillation Ablation on Wednesday, January 7th with Dr. Dr. Kennyth. Please arrive at the Main Entrance A at [facility]: [address] [place], [place] [address] at 6:30am   What To Expect:  Labs: you will need to have lab work drawn within 30 days of your procedure. Please go to any [facility] location to have these drawn - no appointment is needed.  After your procedure we recommend no driving for 4 days, no lifting over 5 lbs for 7 days, and no work or strenuous activity for 7 days.  Please contact our office at [phone] if you have any questions.   Follow-Up: We will contact you to schedule your post-procedure appointments.

## 2024-01-24 ENCOUNTER — Encounter: Payer: Self-pay | Admitting: Cardiology

## 2024-01-24 ENCOUNTER — Other Ambulatory Visit (HOSPITAL_COMMUNITY): Payer: Self-pay

## 2024-01-24 ENCOUNTER — Telehealth: Payer: Self-pay | Admitting: Pharmacy Technician

## 2024-01-24 NOTE — Telephone Encounter (Signed)
 Eliquis  is 152.71 for 30 days  Xarelto  would be 150.63 for 30 days  Dabigatran is 53.03 for 30 days   Not eligible for [employer] grant since no cardiomyopathy   I called the patient and he said the dabigatran would be affordable at 53.03. can e be changed to the pradaxa? If so, he would like that sent in to his pharmacy please and thank you

## 2024-01-25 ENCOUNTER — Encounter: Payer: Self-pay | Admitting: Urology

## 2024-01-25 MED ORDER — DABIGATRAN ETEXILATE MESYLATE 150 MG PO CAPS: 150.0000 mg | ORAL_CAPSULE | Freq: Two times a day (BID) | ORAL | 1 refills | Status: AC

## 2024-01-29 ENCOUNTER — Telehealth: Payer: Self-pay

## 2024-01-29 NOTE — Telephone Encounter (Signed)
-----   Message from Nurse Carlyle C sent at 01/23/2024 10:29 AM EST ----- Regarding: 02/28/24 afib ablation Precert:  MD: Kennyth Type of ablation: A-fib Diagnosis: A-fib CPT code: A-fib (06343) Ablation scheduled (date/time): 1/7 at 8:30am  Procedure:  Added to calendar? Yes Orders entered? Yes Letter complete? Yes Scheduled with cath lab? Yes Any medications to hold? No Labs ordered (CBC, BMET, PT/INR if on warfarin): Yes Mapping system: Doesn't matter CARTO/OPAL rep notified? Yes Cardiac CT needed? No Dye allergy? No Pre-meds ordered and instructions given? No, not needed Letter method: given in clinic H&P: 12/2 Device: No  Follow-up:  Cassie/Angel, please schedule Routine.  Covering RN - please send this message to [employer], EP scheduler, EP Scheduling pool, EP [employer], and CT scheduler (Brittany Lynch/Stephanie Mogg), if indicated.

## 2024-02-06 ENCOUNTER — Encounter (HOSPITAL_COMMUNITY): Payer: Self-pay

## 2024-02-06 ENCOUNTER — Telehealth (HOSPITAL_COMMUNITY): Payer: Self-pay

## 2024-02-06 NOTE — Telephone Encounter (Signed)
 Spoke with patient to complete pre-procedure call.     Health status review:  Any new medical conditions, recent signs of acute illness or been started on antibiotics? No Any recent hospitalizations or surgeries? No Any new medications started since pre-op visit? Pradaxa   Follow all medication instructions prior to procedure or the procedure may be rescheduled:    Continue taking Pradaxa  (Dabigatran ) twice daily without missing any doses before procedure. Essential chronic medications:  No medication should be continued, unless told otherwise. On the morning of your procedure DO NOT take any medication., including Pradaxa  (Dabigatran ).  Nothing to eat or drink after midnight prior to your procedure.  Pre-procedure testing scheduled: CT not required and lab work by December 24.  Confirmed patient is scheduled for Atrial Fibrillation Ablation on Wednesday, January 7 with Dr. Kennyth. Instructed patient to arrive at the Main Entrance A at [facility]: [address] [place], [place] [address] and check in at Admitting at 6:30 AM.  Plan to go home the same day, you will only stay overnight if medically necessary. You MUST have a responsible adult to drive you home and MUST be with you the first 24 hours after you arrive home or your procedure could be cancelled.  Informed a nurse may call a day before the procedure to confirm arrival time and ensure instructions are followed.  Patient verbalized understanding to information provided and is agreeable to proceed with procedure.   Advised to contact RN Navigator at [phone], to inform of any new medications started after call or concerns prior to procedure.  Patient informed that he will have a new healthcare insurance [facility] Medicare Gold Plus) effective January 1. Information obtained and message sent to Pre-service department.

## 2024-02-09 ENCOUNTER — Telehealth: Payer: Self-pay | Admitting: Cardiology

## 2024-02-09 ENCOUNTER — Other Ambulatory Visit (HOSPITAL_COMMUNITY): Payer: Self-pay

## 2024-02-09 ENCOUNTER — Telehealth: Payer: Self-pay | Admitting: Pharmacy Technician

## 2024-02-09 LAB — BASIC METABOLIC PANEL WITH GFR
BUN/Creatinine Ratio: 14 (ref 10–24)
BUN: 18 mg/dL (ref 8–27)
CO2: 24 mmol/L (ref 20–29)
Calcium: 9.6 mg/dL (ref 8.6–10.2)
Chloride: 103 mmol/L (ref 96–106)
Creatinine, Ser: 1.28 mg/dL — AB (ref 0.76–1.27)
Sodium: 143 mmol/L (ref 134–144)
eGFR: 59 mL/min/1.73 — AB

## 2024-02-09 LAB — CBC
Hematocrit: 47.8 % (ref 37.5–51.0)
Hemoglobin: 15.6 g/dL (ref 13.0–17.7)
MCH: 30.2 pg (ref 26.6–33.0)
MCHC: 32.6 g/dL (ref 31.5–35.7)
MCV: 93 fL (ref 79–97)
Platelets: 193 x10E3/uL (ref 150–450)
RBC: 5.16 x10E6/uL (ref 4.14–5.80)
RDW: 13 % (ref 11.6–15.4)
WBC: 6.8 x10E3/uL (ref 3.4–10.8)

## 2024-02-09 NOTE — Telephone Encounter (Signed)
 Pt c/o medication issue:  1. Name of Medication: dabigatran  (PRADAXA ) 150 MG CAPS capsule   2. How are you currently taking this medication (dosage and times per day)?    3. Are you having a reaction (difficulty breathing--STAT)? no  4. What is your medication issue? Patient insurance states that the above medication is not going to be cover under his insurance. Called to recommend Eliquis , Xareltor or  warfarin. Please advise

## 2024-02-09 NOTE — Telephone Encounter (Signed)
" ° °  NEXT AVAILABLE FILL DATE [id number] LAST FILL DT [id number] FILLED AT [facility]   He just got it  "

## 2024-02-10 ENCOUNTER — Ambulatory Visit: Payer: Self-pay | Admitting: Cardiology

## 2024-02-27 ENCOUNTER — Ambulatory Visit: Admitting: Podiatry

## 2024-02-27 ENCOUNTER — Telehealth: Payer: Self-pay | Admitting: Cardiology

## 2024-02-27 DIAGNOSIS — M722 Plantar fascial fibromatosis: Secondary | ICD-10-CM | POA: Diagnosis not present

## 2024-02-27 MED ORDER — MELOXICAM 15 MG PO TABS: 15.0000 mg | ORAL_TABLET | Freq: Every day | ORAL | 1 refills | Status: AC

## 2024-02-27 MED ORDER — METHYLPREDNISOLONE 4 MG PO TBPK: ORAL_TABLET | ORAL | 0 refills | Status: AC

## 2024-02-27 MED ORDER — BETAMETHASONE SOD PHOS & ACET 6 (3-3) MG/ML IJ SUSP
3.0000 mg | Freq: Once | INTRAMUSCULAR | Status: AC
  Administered 2024-02-27: 3 mg via INTRA_ARTICULAR

## 2024-02-27 NOTE — Progress Notes (Signed)
" ° °  Chief Complaint  Patient presents with   Plantar Fasciitis    PLANTAR FASC/LFT FOOT/LAST SEEN 2020.  HP in the left foot x 3 months.      Subjective: 74 y.o. male presenting for recurrence of plantar fasciitis to the left foot.  Last seen 2020.  Onset around 2-3 months ago.   Past Medical History:  Diagnosis Date   Ascending aortic aneurysm    a. 06/2017 Echo: Ao root 4.4cm; b. 03/2023 CTA Chest: Asc Ao 4.5 cm.   Chest tightness    a. 05/2017 Cath: nl cors; b. 11/2022 MV: EF 60-70%, no isch/scar, no signif cor Ca2+. Low risk study.   Chronic pain of left lower extremity    right   COPD (chronic obstructive pulmonary disease) (HCC)    DDD (degenerative disc disease), lumbosacral    Diastolic dysfunction    a. 10/2020 Echo: EF 55-60%, no rwma, GrI DD; b. 07/2023 Echo: EF 50 to 55% w/o rwma, Gr2 DD, nl RV fxn, mild MR/AI, and 44 mm Asc Ao.   Headache    migraines in past. none for 6-8 yrs.   History of kidney stones    Hypothyroidism    Mitral valve prolapse    a. 09/2015 Echo: EF 55-60%, no rwma, nl LA size, nl RV fxn, nl PASP; b. 06/2017 Echo: EF 55-60%, no rwma, Gr2 DD. Asc Ao 4.4cm. Mild MR (? mild prolapse). Mildly dil LA; c. 10/2020 Echo: EF 55-60%, no rwma, GrI DD, nl RV fxn, mild MR/AI; d. 07/2023 Echo: Mild MR.   PAF (paroxysmal atrial fibrillation) (HCC)    a. Dx in 1980's; b. CHA2DS2VASc = 1 - briefly on coumadin many years ago-->caused rash; c. Chronic Amio 200mg  QD.   Wears contact lenses      Objective: Physical Exam General: The patient is alert and oriented x3 in no acute distress.  Dermatology: Skin is warm, dry and supple bilateral lower extremities. Negative for open lesions or macerations bilateral.   Vascular: Dorsalis Pedis and Posterior Tibial pulses palpable bilateral.  Capillary fill time is immediate to all digits.  Neurological: Grossly intact via light touch  Musculoskeletal: Tenderness to palpation to the plantar aspect of the left heel along the  plantar fascia. All other joints range of motion within normal limits bilateral. Strength 5/5 in all groups bilateral.   Assessment: 1. Plantar fasciitis left foot  Plan of Care:  1. Patient evaluated.  2. Injection of 0.5cc Celestone  soluspan injected into the left plantar fascia.  3. Rx for Medrol  Dose Pak placed 4. Rx for Meloxicam  ordered for patient. 5. Continue good supportive tennis shoes 6. Instructed patient regarding therapies and modalities at home to alleviate symptoms.  7. Return to clinic in 4 weeks.    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 Thresa EMERSON Sar, DPM    [address] [place], [place] [address]                Office [phone]  Fax [phone]      "

## 2024-02-27 NOTE — Telephone Encounter (Signed)
 Spoke with pt who states he has developed a URI symptoms/sinus infection over the weekend.  Denies current fever.  Pt advised ablation will need to be rescheduled 4 weeks out.  Dr Shaune first available date is 04/03/2024 at 830am.  Pt verbalizes understanding and is agreeable to this date and time.  Pt advised instruction letter will be updated.

## 2024-02-27 NOTE — Telephone Encounter (Signed)
 Pt has developed a cold over the weekend and is making sure he can still have his ablation. Please advise

## 2024-03-01 ENCOUNTER — Other Ambulatory Visit: Payer: Self-pay

## 2024-03-01 ENCOUNTER — Telehealth: Payer: Self-pay

## 2024-03-01 DIAGNOSIS — I7121 Aneurysm of the ascending aorta, without rupture: Secondary | ICD-10-CM

## 2024-03-01 NOTE — Telephone Encounter (Signed)
-----   Message from Nurse Aldona SAUNDERS, RN sent at 02/27/2024 10:33 AM EST ----- Regarding: AFib procedure date change Pt's Afib ablation rescheduled from 02/28/24 to 04/03/24 due to URI.  Lab has been notified.  Thank You,  Aldona

## 2024-03-01 NOTE — Telephone Encounter (Signed)
 Pt will get labs on 1/29 at appt in [place].

## 2024-03-07 ENCOUNTER — Telehealth (HOSPITAL_COMMUNITY): Payer: Self-pay

## 2024-03-07 ENCOUNTER — Encounter (HOSPITAL_COMMUNITY): Payer: Self-pay

## 2024-03-07 NOTE — Telephone Encounter (Signed)
 Spoke with patient to discuss upcoming procedure.   CT: not required.  Labs: to be completed.   Any recent signs of acute illness or been started on antibiotics? Patient treated for sinusitis and bronchitis on Jan 11. States head congestion has resolved and cough is significantly improving. Will follow up with patient in a couple of weeks to reassess.  Any new medications started? Doxycycline, Prednisone, Tessalon  Any missed doses of blood thinner?  No  Advised patient to continue taking Pradaxa  (Dabigatran ) twice daily without missing any doses. Any medications to hold?  No Medication instructions:  On the morning of your procedure DO NOT take any medication., including Pradaxa  (Dabigatran ) or the procedure may be rescheduled. Nothing to eat or drink after midnight prior to your procedure.  Confirmed patient is scheduled for Atrial Fibrillation Ablation on Wednesday, February 11 with Dr. Kennyth. Instructed patient to arrive at the Main Entrance A at [facility]: [address] [place], [place] [address] and check in at Admitting at 6:30 AM.   Plan to go home the same day, you will only stay overnight if medically necessary. You MUST have a responsible adult to drive you home and MUST be with you the first 24 hours after you arrive home or your procedure could be cancelled.  Informed patient a nurse will call a day before the procedure to confirm arrival time and ensure instructions are followed.  Patient verbalized understanding to all instructions provided and agreed to proceed with procedure.   Advised patient to contact RN Navigator at [phone], to inform of any new medications started after call or concerns prior to procedure.

## 2024-03-19 NOTE — Progress Notes (Unsigned)
 " Cardiology Office Note   Date:  03/21/2024  ID:  Bobby Flowers, DOB 1950-10-04, MRN [id number] PCP: Valora Lynwood FALCON, MD   [facility] Providers Cardiologist:  Deatrice Cage, MD Cardiology APP:  Vivienne Lonni Ingle, NP  Electrophysiologist:  Fonda Kitty, MD     History of Present Illness Bobby Flowers is a 74 y.o. male with history of paroxysmal atrial fibrillation, mitral valve prolapse/mitral regurgitation, thoracic aortic aneurysm, bradycardia, chest pain with normal coronary arteries by catheterization  05/2017, COPD, and hypothyroidism.    He has known paroxysmal atrial fibrillation dating back to the 1980s.  He was previously on warfarin but had an allergic reaction with a rash. He is on chronic amiodarone  therapy.  He was evaluated by A-fib clinic in 2015 with reported stability on low-dose amiodarone  medical therapy was continued.  He had normal coronary arteries and diagnostic catheterization of 05/2017.  Echo 06/2017 showed LVEF of 55-60%, no RWMA, grade 2 DD, ascending aorta moderately dilated at 4.4 cm, mild MR, unable to exclude mild mitral prolapse, LA moderately dilated, and RV normal. Amiodarone  required increased to 200 mg daily in 05/2017 in the setting of increased frequency of paroxysmal atrial fibrillation.  Symptoms improved.  CT chest 09/05/2018 showed 4.5 cm ascending thoracic aorta and CT surgery referral placed.  He noted worsening shortness of breath in 2022 and amiodarone  induced lung toxicity was ruled out. CT chest showed changes suggestive of emphysema with no evidence of interstitial lung disease.  It was felt he had mild COPD related to previous tobacco use and symptoms improved with inhalers.  Stress test 11/2022 shows no evidence of significant ischemia or scar, LVEF 60-70%, no significant coronary artery calcification, and overall low risk study.  Echo 07/2023 LVEF 50-55%, no RWMA, grade 2 DD, RV normal, LA mildly dilated, mild MR, mild AR, and  mild dilation of ascending aorta measuring 44 mm. Most recent CT chest 09/2023 showed ascending thoracic aorta measuring 4.6 cm with mild 2.7 cm proximal descending thoracic aorta.  He was last seen in office by Dr. Kitty 01/2024 and it was decided he will undergo EP study and A-fib ablation 04/03/2024 while continuing amiodarone  as bridge to ablation.    He presents today for 80-month follow-up in the setting of paroxysmal atrial fibrillation and mitral regurgitation. He is doing well since his last visit. He notes not feeling like he was in a-fib, even with his recent illness. He is walking on the treadmill twice weekly and occasionally uses his stationary bike. He denies missed doses of Pradaxa . He reports shortness of breath this is noted to be chronic and remains stable. He denies chest pain, lower extremity edema, fatigue, palpitations, melena, hematuria, hemoptysis, diaphoresis, weakness, presyncope, syncope, orthopnea, and PND.  ROS: All systems negative unless otherwise indicated in HPI.  Studies Reviewed EKG Interpretation Date/Time:  Thursday March 21 2024 08:22:15 EST Ventricular Rate:  49 PR Interval:  274 QRS Duration:  96 QT Interval:  458 QTC Calculation: 413 R Axis:   166  Text Interpretation: Sinus bradycardia with 1st degree A-V block Inferior infarct (cited on or before 21-Mar-2024) Anterolateral infarct (cited on or before 21-Mar-2024) Confirmed by Teresa Fish [phone]) on 03/21/2024 8:29:47 AM    Cardiac Studies & Procedures   ______________________________________________________________________________________________ CARDIAC CATHETERIZATION  CARDIAC CATHETERIZATION 05/29/2017  Conclusion 1.  Normal coronary arteries. 2.  Very unusual horizontal orientation of the heart.  I did not do left ventricular angiography given frequent PVCs and unusual orientation of the pigtail  catheter.  Recommendations: Continue medical therapy. Obtain an echocardiogram to evaluate  LV systolic function.  Findings Coronary Findings Diagnostic  Dominance: Right  Left Main Vessel is angiographically normal.  Left Anterior Descending  First Diagonal Branch Vessel is angiographically normal.  Second Diagonal Branch Vessel is angiographically normal.  Third Diagonal Branch Vessel is angiographically normal.  Left Circumflex Vessel is angiographically normal.  First Obtuse Marginal Branch Vessel is angiographically normal.  Second Obtuse Marginal Branch Vessel is angiographically normal.  Third Obtuse Marginal Branch Vessel is angiographically normal.  Right Coronary Artery Vessel is small. Vessel is angiographically normal.  Intervention  No interventions have been documented.   STRESS TESTS  NM MYOCAR MULTI W/SPECT W 12/02/2022  Narrative   Probably normal pharmacologic myocardial perfusion stress test.   There is no evidence of significant ischemia or scar, though perfusion images are suboptimal due to motion, extracardiac activity, and positioning of the heart in the thorax.   Left ventricular systolic function is normal (LVEF 60-70%).   There is no significant coronary artery calcification.  Dilation of the ascending aorta is again noted, better evaluated on the CTA chest from 10/20/2022.   Hypodensities in the visualized liver most likely represent cysts.   This is a low risk study.  If clinical concern for ischemic heart disease persists, consider coronary CTA, myocardial PET/CT, or cardiac catheterization.   ECHOCARDIOGRAM  ECHOCARDIOGRAM COMPLETE 08/21/2023  Narrative ECHOCARDIOGRAM REPORT    Patient Name:   Bobby Flowers [facility] Date of Exam: 08/21/2023 Medical Rec #:  [id number]     Height:       69.0 in Accession #:    [id number]    Weight:       182.8 lb Date of Birth:  05-09-1950      BSA:          1.989 m Patient Age:    72 years      BP:           120/70 mmHg Patient Gender: M             HR:           45 bpm. Exam Location:   [place]  Procedure: 2D Echo, Cardiac Doppler and Color Doppler (Both Spectral and Color Flow Doppler were utilized during procedure).  Indications:    R06.9 DOE  History:        Patient has prior history of Echocardiogram examinations, most recent 10/22/2020. Abnormal ECG, Mitral Valve Prolapse, Arrythmias:Bradycardia and Atrial Fibrillation; Signs/Symptoms:Shortness of Breath, Dyspnea and Fatigue.  Sonographer:    Doyal Point MHA, BS, RDCS Referring Phys: [id number] CHRISTOPHER RONALD BERGE  IMPRESSIONS   1. Left ventricular ejection fraction, by estimation, is 50 to 55%. The left ventricle has low normal function. The left ventricle has no regional wall motion abnormalities. Left ventricular diastolic parameters are consistent with Grade II diastolic dysfunction (pseudonormalization). 2. Right ventricular systolic function is normal. The right ventricular size is normal. 3. Left atrial size was mildly dilated. 4. The mitral valve is normal in structure. Mild mitral valve regurgitation. 5. The aortic valve is tricuspid. Aortic valve regurgitation is mild. 6. Aortic dilatation noted. There is mild dilatation of the ascending aorta, measuring 44 mm. 7. The inferior vena cava is normal in size with greater than 50% respiratory variability, suggesting right atrial pressure of 3 mmHg.  FINDINGS Left Ventricle: Left ventricular ejection fraction, by estimation, is 50 to 55%. The left ventricle has low normal function. The left ventricle has no  regional wall motion abnormalities. The left ventricular internal cavity size was normal in size. There is no left ventricular hypertrophy. Left ventricular diastolic parameters are consistent with Grade II diastolic dysfunction (pseudonormalization).  Right Ventricle: The right ventricular size is normal. No increase in right ventricular wall thickness. Right ventricular systolic function is normal.  Left Atrium: Left atrial size was mildly  dilated.  Right Atrium: Right atrial size was normal in size.  Pericardium: There is no evidence of pericardial effusion.  Mitral Valve: The mitral valve is normal in structure. Mild mitral valve regurgitation.  Tricuspid Valve: The tricuspid valve is normal in structure. Tricuspid valve regurgitation is mild.  Aortic Valve: The aortic valve is tricuspid. Aortic valve regurgitation is mild. Aortic valve mean gradient measures 8.0 mmHg. Aortic valve peak gradient measures 15.2 mmHg.  Pulmonic Valve: The pulmonic valve was normal in structure. Pulmonic valve regurgitation is mild.  Aorta: Aortic dilatation noted. There is mild dilatation of the ascending aorta, measuring 44 mm.  Venous: The inferior vena cava is normal in size with greater than 50% respiratory variability, suggesting right atrial pressure of 3 mmHg.  IAS/Shunts: No atrial level shunt detected by color flow Doppler.  Additional Comments: A device lead is visualized.   LEFT VENTRICLE PLAX 2D LVIDd:         5.06 cm Diastology LVIDs:         3.85 cm LV e' medial:    3.70 cm/s LV PW:         1.12 cm LV E/e' medial:  25.4 LV IVS:        1.14 cm LV e' lateral:   9.90 cm/s LV E/e' lateral: 9.5   RIGHT VENTRICLE RV S prime:     7.83 cm/s  LEFT ATRIUM             Index LA diam:        4.30 cm 2.16 cm/m LA Vol (A2C):   92.6 ml 46.57 ml/m LA Vol (A4C):   70.3 ml 35.35 ml/m LA Biplane Vol: 82.1 ml 41.29 ml/m AORTIC VALVE AV Vmax:           195.00 cm/s AV Vmean:          126.000 cm/s AV VTI:            0.438 m AV Peak Grad:      15.2 mmHg AV Mean Grad:      8.0 mmHg LVOT Vmax:         109.00 cm/s LVOT Vmean:        59.500 cm/s LVOT VTI:          0.250 m LVOT/AV VTI ratio: 0.57  AORTA Ao Sinus diam: 3.88 cm Ao Asc diam:   4.40 cm  MITRAL VALVE MV Area (PHT): 3.12 cm     SHUNTS MV Decel Time: 243 msec     Systemic VTI: 0.25 m MV E velocity: 93.90 cm/s MV A velocity: 106.00 cm/s MV E/A ratio:   0.89  Redell Cave MD Electronically signed by Redell Cave MD Signature Date/Time: 08/22/2023/2:32:28 PM    Final          ______________________________________________________________________________________________      Risk Assessment/Calculations  CHA2DS2-VASc Score = 1   This indicates a 0.6% annual risk of stroke. The patient's score is based upon: CHF History: 0 HTN History: 0 Diabetes History: 0 Stroke History: 0 Vascular Disease History: 0 Age Score: 1 Gender Score: 0  Physical Exam VS:  BP 90/60 (BP Location: Left Arm, Patient Position: Sitting, Cuff Size: Normal)   Pulse (!) 49   Ht 5' 9 (1.753 m)   Wt 181 lb 8 oz (82.3 kg)   SpO2 95%   BMI 26.80 kg/m        Wt Readings from Last 3 Encounters:  03/21/24 181 lb 8 oz (82.3 kg)  01/23/24 185 lb 3.2 oz (84 kg)  01/15/24 184 lb (83.5 kg)    GEN: Well nourished, well developed in no acute distress NECK: No JVD; No carotid bruits CARDIAC: RRR, no murmurs, rubs, gallops RESPIRATORY:  Clear to auscultation without rales, wheezing or rhonchi  ABDOMEN: Soft, non-tender, non-distended EXTREMITIES:  No edema; No deformity   ASSESSMENT AND PLAN  Sinus bradycardia- Previously heart rate in the 40's on home watch monitoring after reducing atenolol  12.5 mg daily.  -Heart rate today is 49 -EKG is sinus brady with 1st degree AV block.  -Denies dizziness and lightheadedness.   Persistent a-fib- Last seen in office by Dr. Kennyth 01/2024 and it was decided he will undergo EP study and A-fib ablation 04/03/2024 while continuing amiodarone  as bridge to ablation. -EKG today as above.  -Continue Pradaxa  150 mg BID, atenolol  12.5 daily, and amiodarone  200 mg daily. -Repeat CBC and BMET today for upcoming ablation.  CHA2DS2-VASc Score = 1 [CHF History: 0, HTN History: 0, Diabetes History: 0, Stroke History: 0, Vascular Disease History: 0, Age Score: 1, Gender Score: 0].  Therefore, the patient's  annual risk of stroke is 0.6 %.      Mitral valve prolapse/mild MR- Echo 07/2023 LVEF 50-55%, no RWMA, grade 2 DD, RV normal, LA mildly dilated, mild MR, mild AR, and mild dilation of ascending aorta measuring 44 mm. -Denies worsening shortness of breath today.   Thoracic aortic aneurysm- CT chest 09/2023 showed ascending thoracic aorta measuring 4.6 cm with mild 2.7 cm proximal descending thoracic aorta. -BP well controlled.  -Continue to follow with CT surgery.      Dispo: Follow up with Arida or APP in 6 months.   Signed, Mardy KATHEE Pizza, FNP  "

## 2024-03-21 ENCOUNTER — Other Ambulatory Visit: Payer: Self-pay | Admitting: Cardiovascular Disease

## 2024-03-21 ENCOUNTER — Encounter: Payer: Self-pay | Admitting: Nurse Practitioner

## 2024-03-21 ENCOUNTER — Ambulatory Visit

## 2024-03-21 VITALS — BP 90/60 | HR 49 | Ht 69.0 in | Wt 181.5 lb

## 2024-03-21 DIAGNOSIS — R001 Bradycardia, unspecified: Secondary | ICD-10-CM | POA: Diagnosis not present

## 2024-03-21 DIAGNOSIS — I341 Nonrheumatic mitral (valve) prolapse: Secondary | ICD-10-CM

## 2024-03-21 DIAGNOSIS — I7121 Aneurysm of the ascending aorta, without rupture: Secondary | ICD-10-CM | POA: Diagnosis not present

## 2024-03-21 DIAGNOSIS — I4819 Other persistent atrial fibrillation: Secondary | ICD-10-CM

## 2024-03-21 NOTE — Telephone Encounter (Signed)
 Noted patient tested positive for Influenza A on Jan 23.  Attempted to reach patient to discuss upcoming procedure and re-evaluate symptoms, no answer. Left VM for patient to return call.

## 2024-03-21 NOTE — Patient Instructions (Signed)
 Medication Instructions:  Your physician recommends that you continue on your current medications as directed. Please refer to the Current Medication list given to you today.   *If you need a refill on your cardiac medications before your next appointment, please call your pharmacy*  Lab Work: Your provider would like for you to have following labs drawn today CBC, BMP.   If you have labs (blood work) drawn today and your tests are completely normal, you will receive your results only by: MyChart Message (if you have MyChart) OR A paper copy in the mail If you have any lab test that is abnormal or we need to change your treatment, we will call you to review the results.  Testing/Procedures: No test ordered today   Follow-Up: At [facility], you and your health needs are our priority.  As part of our continuing mission to provide you with exceptional heart care, our providers are all part of one team.  This team includes your primary Cardiologist (physician) and Advanced Practice Providers or APPs (Physician Assistants and Nurse Practitioners) who all work together to provide you with the care you need, when you need it.  Your next appointment:   6 month(s)  Provider:   You may see Deatrice Cage, MD or one of the following Advanced Practice Providers on your designated Care Team:   Lonni Meager, NP Lesley Maffucci, PA-C Bernardino Bring, PA-C Cadence Belleview, PA-C Tylene Lunch, NP Barnie Hila, NP    We recommend signing up for the patient portal called MyChart.  Sign up information is provided on this After Visit Summary.  MyChart is used to connect with patients for Virtual Visits (Telemedicine).  Patients are able to view lab/test results, encounter notes, upcoming appointments, etc.  Non-urgent messages can be sent to your provider as well.   To learn more about what you can do with MyChart, go to [email or web address].

## 2024-03-22 LAB — BASIC METABOLIC PANEL WITH GFR
BUN/Creatinine Ratio: 22 (ref 10–24)
BUN: 25 mg/dL (ref 8–27)
CO2: 27 mmol/L (ref 20–29)
Calcium: 9 mg/dL (ref 8.6–10.2)
Chloride: 103 mmol/L (ref 96–106)
Creatinine, Ser: 1.14 mg/dL (ref 0.76–1.27)
Glucose: 91 mg/dL (ref 70–99)
Potassium: 5 mmol/L (ref 3.5–5.2)
Sodium: 142 mmol/L (ref 134–144)
eGFR: 68 mL/min/{1.73_m2}

## 2024-03-22 LAB — CBC
Hematocrit: 50.6 % (ref 37.5–51.0)
Hemoglobin: 16.4 g/dL (ref 13.0–17.7)
MCH: 30.3 pg (ref 26.6–33.0)
MCHC: 32.4 g/dL (ref 31.5–35.7)
MCV: 94 fL (ref 79–97)
Platelets: 210 10*3/uL (ref 150–450)
RBC: 5.41 x10E6/uL (ref 4.14–5.80)
RDW: 13.4 % (ref 11.6–15.4)
WBC: 8.8 10*3/uL (ref 3.4–10.8)

## 2024-03-23 ENCOUNTER — Ambulatory Visit: Payer: Self-pay

## 2024-03-27 ENCOUNTER — Ambulatory Visit (HOSPITAL_COMMUNITY)
Admission: RE | Admit: 2024-03-27 | Discharge: 2024-03-27 | Disposition: A | Source: Ambulatory Visit | Attending: Cardiovascular Disease | Admitting: Cardiovascular Disease

## 2024-03-27 DIAGNOSIS — I7121 Aneurysm of the ascending aorta, without rupture: Secondary | ICD-10-CM

## 2024-03-27 MED ORDER — IOHEXOL 350 MG/ML SOLN
75.0000 mL | Freq: Once | INTRAVENOUS | Status: AC | PRN
  Administered 2024-03-27: 75 mL via INTRAVENOUS

## 2024-03-29 ENCOUNTER — Encounter: Payer: Self-pay | Admitting: Podiatry

## 2024-03-29 ENCOUNTER — Ambulatory Visit: Admitting: Podiatry

## 2024-03-29 VITALS — Ht 69.0 in | Wt 181.5 lb

## 2024-03-29 DIAGNOSIS — M722 Plantar fascial fibromatosis: Secondary | ICD-10-CM

## 2024-03-29 NOTE — Progress Notes (Signed)
 "  Chief Complaint  74 y.o. male presents with   Plantar Fasciitis    Pt is here to f/u on left foot due to pain, states about a week or so ago the pain has come back not as intense as it was on his previous visit, would like another injection.    Subjective: 74 y.o. male presenting for recurrence of plantar fasciitis to the left foot.  Overall improvement.   Past Medical History:  Diagnosis Date   Ascending aortic aneurysm    a. 06/2017 Echo: Ao root 4.4cm; b. 03/2023 CTA Chest: Asc Ao 4.5 cm.   Chest tightness    a. 05/2017 Cath: nl cors; b. 11/2022 MV: EF 60-70%, no isch/scar, no signif cor Ca2+. Low risk study.   Chronic pain of left lower extremity    right   COPD (chronic obstructive pulmonary disease) (HCC)    DDD (degenerative disc disease), lumbosacral    Diastolic dysfunction    a. 10/2020 Echo: EF 55-60%, no rwma, GrI DD; b. 07/2023 Echo: EF 50 to 55% w/o rwma, Gr2 DD, nl RV fxn, mild MR/AI, and 44 mm Asc Ao.   Headache    migraines in past. none for 6-8 yrs.   History of kidney stones    Hypothyroidism    Mitral valve prolapse    a. 09/2015 Echo: EF 55-60%, no rwma, nl LA size, nl RV fxn, nl PASP; b. 06/2017 Echo: EF 55-60%, no rwma, Gr2 DD. Asc Ao 4.4cm. Mild MR (? mild prolapse). Mildly dil LA; c. 10/2020 Echo: EF 55-60%, no rwma, GrI DD, nl RV fxn, mild MR/AI; d. 07/2023 Echo: Mild MR.   PAF (paroxysmal atrial fibrillation) (HCC)    a. Dx in 1980's; b. CHA2DS2VASc = 1 - briefly on coumadin many years ago-->caused rash; c. Chronic Amio 200mg  QD.   Wears contact lenses    Past Surgical History:  Procedure Laterality Date   CARDIAC CATHETERIZATION     COLONOSCOPY  04/25/2005   COLONOSCOPY WITH PROPOFOL  N/A 07/22/2015   Procedure: COLONOSCOPY WITH PROPOFOL ;  Surgeon: Reyes LELON Cota, MD;  Location: [facility];  Service: Endoscopy;  Laterality: N/A;   COLONOSCOPY WITH PROPOFOL  N/A 09/04/2020   Procedure: COLONOSCOPY WITH PROPOFOL ;  Surgeon: Cota Reyes LELON, MD;  Location:  [facility];  Service: Endoscopy;  Laterality: N/A;   CYSTOSCOPY WITH HOLMIUM LASER LITHOTRIPSY Bilateral 07/28/2017   Procedure: CYSTOSCOPY WITH HOLMIUM LASER LITHOTRIPSY;  Surgeon: Carolee Sherwood JONETTA DOUGLAS, MD;  Location: [facility];  Service: Urology;  Laterality: Bilateral;   CYSTOSCOPY WITH STENT PLACEMENT Bilateral 07/28/2017   Procedure: CYSTOSCOPY WITH STENT PLACEMENT;  Surgeon: Carolee Sherwood JONETTA DOUGLAS, MD;  Location: [facility];  Service: Urology;  Laterality: Bilateral;   CYSTOSCOPY WITH URETEROSCOPY Bilateral 07/28/2017   Procedure: CYSTOSCOPY WITH URETEROSCOPY;  Surgeon: Carolee Sherwood JONETTA DOUGLAS, MD;  Location: [facility];  Service: Urology;  Laterality: Bilateral;   CYSTOSCOPY/URETEROSCOPY/HOLMIUM LASER/STENT PLACEMENT Left 11/09/2021   Procedure: CYSTOSCOPY/URETEROSCOPY/HOLMIUM LASER/STENT PLACEMENT;  Surgeon: Twylla Glendia BROCKS, MD;  Location: [facility];  Service: Urology;  Laterality: Left;   EYE SURGERY Left    cataract surgery   HEMORROIDECTOMY     HERNIA REPAIR Left 05/22/2015   Left inguinal hernia repair with medium Ultra Pro mesh   INGUINAL HERNIA REPAIR Left 07/22/2015   Procedure: HERNIA REPAIR INGUINAL ADULT;  Surgeon: Reyes LELON Cota, MD;  Location: [facility];  Service: General;  Laterality: Left;   LEFT HEART CATH AND CORONARY ANGIOGRAPHY Left 05/29/2017   Procedure: LEFT HEART CATH AND  CORONARY ANGIOGRAPHY;  Surgeon: Darron Deatrice LABOR, MD;  Location: [facility];  Service: Cardiovascular;  Laterality: Left;   LUMBAR FUSION N/A    LUMBAR LAMINECTOMY/DECOMPRESSION MICRODISCECTOMY Right 07/01/2019   Procedure: OPEN L4 LAMINECTOMY, L3/4 DISCECTOMY ON RIGHT, L4/5 DISCECTOMY;  Surgeon: Bluford Standing, MD;  Location: [facility];  Service: Neurosurgery;  Laterality: Right;   myringotomy     with tubes x 5.  tubes fall out frequently   MYRINGOTOMY WITH TUBE PLACEMENT Left 12/13/2018   Procedure: MYRINGOTOMY WITH T-TUBE PLACEMENT;  Surgeon: Milissa Hamming, MD;  Location: [facility];  Service: ENT;  Laterality: Left;  needs to stay first case vaught / juengel doing case together   NASAL SINUS SURGERY     NASOPHARYNGOSCOPY EUSTATION TUBE BALLOON DILATION Left 12/13/2018   Procedure: NASOPHARYNGOSCOPY EUSTATION TUBE BALLOON DILATION WITH OUTFRACTURE OF TURBINATES;  Surgeon: Milissa Hamming, MD;  Location: [facility];  Service: ENT;  Laterality: Left;   SPINE SURGERY N/A    TOTAL HIP ARTHROPLASTY Right 03/26/2021   Procedure: TOTAL HIP ARTHROPLASTY ANTERIOR APPROACH;  Surgeon: Kathlynn Sharper, MD;  Location: [facility];  Service: Orthopedics;  Laterality: Right;   VASECTOMY     Allergies[1]   Objective: Physical Exam General: The patient is alert and oriented x3 in no acute distress.  Dermatology: Skin is warm, dry and supple bilateral lower extremities. Negative for open lesions or macerations bilateral.   Vascular: Dorsalis Pedis and Posterior Tibial pulses palpable bilateral.  Capillary fill time is immediate to all digits.  Neurological: Grossly intact via light touch  Musculoskeletal: Minimal tenderness to palpation to the plantar aspect of the left heel along the plantar fascia. All other joints range of motion within normal limits bilateral. Strength 5/5 in all groups bilateral.   Assessment: 1. Plantar fasciitis left foot  Plan of Care:  -Patient evaluated.  -Injection of 0.5cc Celestone  soluspan injected into the left plantar fascia.  -Continue meloxicam  15 mg daily PRN -Continue wearing good supportive tennis shoes and sneakers -Overall patient significantly improved.  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 Thresa EMERSON Sar, DPM    [address] [place], [place] [address]                Office [phone]  Fax [phone]         [1]  Allergies Allergen Reactions    Quinolones Other (See Comments)    Ascending thoracic aortic aneurysm, use with caution   Adhesive [Tape] Rash    Paper tape okay   Coumadin [Warfarin] Rash   "

## 2024-04-03 ENCOUNTER — Ambulatory Visit (HOSPITAL_COMMUNITY): Admission: RE | Admit: 2024-04-03 | Source: Home / Self Care | Admitting: Cardiology

## 2024-04-03 ENCOUNTER — Encounter (HOSPITAL_COMMUNITY): Admission: RE | Payer: Self-pay | Source: Home / Self Care

## 2024-04-03 SURGERY — ATRIAL FIBRILLATION ABLATION
Anesthesia: General

## 2024-04-10 ENCOUNTER — Ambulatory Visit

## 2024-04-16 ENCOUNTER — Ambulatory Visit: Admitting: Physician Assistant

## 2024-05-22 ENCOUNTER — Ambulatory Visit: Admitting: Urology

## 2024-05-23 ENCOUNTER — Ambulatory Visit: Admitting: Urology
